# Patient Record
Sex: Female | Born: 1935 | Race: White | Hispanic: No | Smoking: Never smoker
Health system: Southern US, Community
[De-identification: ages and names within clinical notes are randomized; demographics above are authoritative.]

## PROBLEM LIST (undated history)

## (undated) DIAGNOSIS — R2681 Unsteadiness on feet: Secondary | ICD-10-CM

## (undated) DIAGNOSIS — I341 Nonrheumatic mitral (valve) prolapse: Secondary | ICD-10-CM

## (undated) DIAGNOSIS — K5900 Constipation, unspecified: Secondary | ICD-10-CM

## (undated) DIAGNOSIS — D72829 Elevated white blood cell count, unspecified: Secondary | ICD-10-CM

## (undated) DIAGNOSIS — E871 Hypo-osmolality and hyponatremia: Secondary | ICD-10-CM

## (undated) DIAGNOSIS — E039 Hypothyroidism, unspecified: Secondary | ICD-10-CM

## (undated) DIAGNOSIS — I10 Essential (primary) hypertension: Secondary | ICD-10-CM

## (undated) DIAGNOSIS — B029 Zoster without complications: Secondary | ICD-10-CM

## (undated) DIAGNOSIS — M1990 Unspecified osteoarthritis, unspecified site: Secondary | ICD-10-CM

## (undated) DIAGNOSIS — D62 Acute posthemorrhagic anemia: Secondary | ICD-10-CM

## (undated) DIAGNOSIS — M1711 Unilateral primary osteoarthritis, right knee: Secondary | ICD-10-CM

## (undated) DIAGNOSIS — L719 Rosacea, unspecified: Secondary | ICD-10-CM

## (undated) DIAGNOSIS — I739 Peripheral vascular disease, unspecified: Secondary | ICD-10-CM

## (undated) DIAGNOSIS — R011 Cardiac murmur, unspecified: Secondary | ICD-10-CM

## (undated) DIAGNOSIS — K219 Gastro-esophageal reflux disease without esophagitis: Secondary | ICD-10-CM

## (undated) HISTORY — DX: Unilateral primary osteoarthritis, right knee: M17.11

## (undated) HISTORY — DX: Zoster without complications: B02.9

## (undated) HISTORY — PX: CHOLECYSTECTOMY: SHX55

## (undated) HISTORY — DX: Constipation, unspecified: K59.00

## (undated) HISTORY — PX: COLONOSCOPY: SHX174

## (undated) HISTORY — DX: Unsteadiness on feet: R26.81

## (undated) HISTORY — PX: FINGER SURGERY: SHX640

## (undated) HISTORY — DX: Acute posthemorrhagic anemia: D62

## (undated) HISTORY — DX: Essential (primary) hypertension: I10

## (undated) HISTORY — DX: Hypo-osmolality and hyponatremia: E87.1

## (undated) HISTORY — DX: Nonrheumatic mitral (valve) prolapse: I34.1

## (undated) HISTORY — DX: Peripheral vascular disease, unspecified: I73.9

## (undated) HISTORY — PX: TUBAL LIGATION: SHX77

## (undated) HISTORY — PX: BUNIONECTOMY: SHX129

## (undated) HISTORY — DX: Elevated white blood cell count, unspecified: D72.829

---

## 2000-09-18 ENCOUNTER — Encounter (INDEPENDENT_AMBULATORY_CARE_PROVIDER_SITE_OTHER): Payer: Self-pay | Admitting: *Deleted

## 2000-09-18 ENCOUNTER — Ambulatory Visit (HOSPITAL_BASED_OUTPATIENT_CLINIC_OR_DEPARTMENT_OTHER): Admission: RE | Admit: 2000-09-18 | Discharge: 2000-09-18 | Payer: Self-pay | Admitting: General Surgery

## 2001-03-19 ENCOUNTER — Other Ambulatory Visit: Admission: RE | Admit: 2001-03-19 | Discharge: 2001-03-19 | Payer: Self-pay | Admitting: Family Medicine

## 2002-04-28 ENCOUNTER — Other Ambulatory Visit: Admission: RE | Admit: 2002-04-28 | Discharge: 2002-04-28 | Payer: Self-pay | Admitting: Family Medicine

## 2002-05-19 ENCOUNTER — Ambulatory Visit (HOSPITAL_COMMUNITY): Admission: RE | Admit: 2002-05-19 | Discharge: 2002-05-19 | Payer: Self-pay | Admitting: Gastroenterology

## 2003-05-27 ENCOUNTER — Other Ambulatory Visit: Admission: RE | Admit: 2003-05-27 | Discharge: 2003-05-27 | Payer: Self-pay | Admitting: Family Medicine

## 2004-06-08 ENCOUNTER — Other Ambulatory Visit: Admission: RE | Admit: 2004-06-08 | Discharge: 2004-06-08 | Payer: Self-pay | Admitting: Family Medicine

## 2005-07-03 ENCOUNTER — Other Ambulatory Visit: Admission: RE | Admit: 2005-07-03 | Discharge: 2005-07-03 | Payer: Self-pay | Admitting: Family Medicine

## 2006-10-01 ENCOUNTER — Other Ambulatory Visit: Admission: RE | Admit: 2006-10-01 | Discharge: 2006-10-01 | Payer: Self-pay | Admitting: Family Medicine

## 2008-12-28 ENCOUNTER — Encounter: Admission: RE | Admit: 2008-12-28 | Discharge: 2008-12-28 | Payer: Self-pay | Admitting: Family Medicine

## 2010-01-09 ENCOUNTER — Other Ambulatory Visit
Admission: RE | Admit: 2010-01-09 | Discharge: 2010-01-09 | Payer: Self-pay | Source: Home / Self Care | Admitting: Family Medicine

## 2010-01-12 ENCOUNTER — Encounter
Admission: RE | Admit: 2010-01-12 | Discharge: 2010-01-12 | Payer: Self-pay | Source: Home / Self Care | Attending: Family Medicine | Admitting: Family Medicine

## 2010-05-25 NOTE — Op Note (Signed)
   NAME:  [name], [name]                 ACCOUNT NO.:  [id number]   MEDICAL RECORD NO.:  [id number]                   PATIENT TYPE:  AMB   LOCATION:  ENDO                                 FACILITY:  [facility]   PHYSICIAN:  John C. Madilyn Fireman, M.D.                 DATE OF BIRTH:  09/07/1933   DATE OF PROCEDURE:  05/19/2002  DATE OF DISCHARGE:                                 OPERATIVE REPORT   PROCEDURE:  Colonoscopy.   INDICATIONS FOR PROCEDURE:  Colon cancer screening.   DESCRIPTION OF PROCEDURE:  The patient was placed in the left lateral  decubitus position and placed on the pulse monitor with continuous low flow  oxygen delivered by nasal cannula.  She was sedated with 50 mcg IV fentanyl,  6 mg IV Versed.  The Olympus video colonoscope was inserted into the rectum  and advanced to the cecum, confirmed by transillumination of McBurney's  point and visualization of the ileocecal valve and appendiceal orifice.  Prep was excellent.  The cecum, ascending, transverse, descending, and  sigmoid colon all appeared normal with no masses, polyps, diverticula, or  other mucosal abnormalities.  The rectum likewise appeared normal and  retroflexed view of the anus revealed no obvious internal hemorrhoids.  The  colonoscope was then withdrawn and the patient returned to the recovery room  in stable condition.  She tolerated the procedure well and there were no  immediate complications.   IMPRESSION:  Basically normal colonoscopy.   PLAN:  The next colon screening by sigmoidoscopy in five years.                                               John C. Madilyn Fireman, M.D.    JCH/MEDQ  D:  05/19/2002  T:  05/20/2002  Job:  [id number]

## 2010-05-25 NOTE — Op Note (Signed)
[facility]  Patient:    [name], [name] Visit Number: [id number] MRN: [id number]          Service Type: DSU Location: [name] Attending Physician:  Brandy Hale Dictated by:   Angelia Mould. Derrell Lolling, M.D. Proc. Date: 09/18/00 Admit Date:  09/18/2000   CC:         Hope M. Danella Deis, M.D.   Operative Report  PREOPERATIVE DIAGNOSIS:  A 3.0 cm diameter lipoma right groin.  POSTOPERATIVE DIAGNOSIS:  A 3.0 cm diameter lipoma right groin.  OPERATION PERFORMED:  Excision 3.0 cm diameter soft tissue mass of right groin.  SURGEON:  Angelia Mould. Derrell Lolling, M.D.  ANESTHESIA:  INDICATIONS FOR PROCEDURE:  The patient is a 77 year old white female who has had a mass in her right inguinal area for five years.  This has been getting larger and has now become very painful.  Exam reveals a 3.0 cm x 1.5 cm subcutaneous mass laterally at the inguinal crease.  This is not inflamed. There is no skin change and there is no evidence of a hernia.  She was brought to the operating room for excision of this soft tissue mass.  DESCRIPTION OF PROCEDURE:  The patient was brought to the minor procedure room at [facility] and placed supine on the operating table. The right groin was prepped and draped in sterile fashion.  1% Xylocaine with epinephrine was used as a local infiltration anesthetic.  A linear incision was made overlying the soft tissue mass.  The incision paralleled the inguinal crease.  I dissected out intact a 3.0 cm diameter soft tissue mass consistent with an encapsulated lipoma.  This was sent for routine histology.  The wound looked good.  Hemostasis was excellent.  The skin was closed with a running subcuticular suture of 4-0 Vicryl and Steri-Strips.  Clean bandages were placed and the patient was taken to the recovery room in stable condition. Estimated blood loss was about 5 cc.  Complications were none.  Sponge, needle and  instrument counts were correct. Dictated by:   Angelia Mould. Derrell Lolling, M.D. Attending Physician:  Brandy Hale DD:  09/18/00 TD:  09/18/00 Job: [id number]

## 2010-12-28 ENCOUNTER — Other Ambulatory Visit: Payer: Self-pay | Admitting: Family Medicine

## 2010-12-28 DIAGNOSIS — Z1231 Encounter for screening mammogram for malignant neoplasm of breast: Secondary | ICD-10-CM

## 2011-01-18 ENCOUNTER — Ambulatory Visit
Admission: RE | Admit: 2011-01-18 | Discharge: 2011-01-18 | Disposition: A | Discharge status: Home / Self Care | Payer: Medicare Other | Source: Ambulatory Visit | Attending: Family Medicine | Admitting: Family Medicine

## 2011-01-18 DIAGNOSIS — Z1231 Encounter for screening mammogram for malignant neoplasm of breast: Secondary | ICD-10-CM

## 2012-01-10 ENCOUNTER — Other Ambulatory Visit: Payer: Self-pay | Admitting: Family Medicine

## 2012-01-10 DIAGNOSIS — Z1231 Encounter for screening mammogram for malignant neoplasm of breast: Secondary | ICD-10-CM

## 2012-02-10 ENCOUNTER — Ambulatory Visit
Admission: RE | Admit: 2012-02-10 | Discharge: 2012-02-10 | Disposition: A | Discharge status: Home / Self Care | Payer: Medicare Other | Source: Ambulatory Visit | Attending: Family Medicine | Admitting: Family Medicine

## 2012-02-10 DIAGNOSIS — Z1231 Encounter for screening mammogram for malignant neoplasm of breast: Secondary | ICD-10-CM

## 2013-04-27 ENCOUNTER — Other Ambulatory Visit: Payer: Self-pay

## 2013-04-27 DIAGNOSIS — Z1231 Encounter for screening mammogram for malignant neoplasm of breast: Secondary | ICD-10-CM

## 2013-05-03 ENCOUNTER — Encounter (INDEPENDENT_AMBULATORY_CARE_PROVIDER_SITE_OTHER): Payer: Self-pay

## 2013-05-03 ENCOUNTER — Ambulatory Visit
Admission: RE | Admit: 2013-05-03 | Discharge: 2013-05-03 | Disposition: A | Discharge status: Home / Self Care | Payer: Commercial Managed Care - HMO | Source: Ambulatory Visit

## 2013-05-03 DIAGNOSIS — Z1231 Encounter for screening mammogram for malignant neoplasm of breast: Secondary | ICD-10-CM

## 2014-05-23 ENCOUNTER — Other Ambulatory Visit: Payer: Self-pay

## 2014-05-23 DIAGNOSIS — Z1231 Encounter for screening mammogram for malignant neoplasm of breast: Secondary | ICD-10-CM

## 2014-06-21 ENCOUNTER — Ambulatory Visit
Admission: RE | Admit: 2014-06-21 | Discharge: 2014-06-21 | Disposition: A | Discharge status: Home / Self Care | Payer: PPO | Source: Ambulatory Visit

## 2014-06-21 DIAGNOSIS — Z1231 Encounter for screening mammogram for malignant neoplasm of breast: Secondary | ICD-10-CM

## 2014-12-15 ENCOUNTER — Ambulatory Visit: Payer: Self-pay | Admitting: Orthopedic Surgery

## 2015-01-12 DIAGNOSIS — M25531 Pain in right wrist: Secondary | ICD-10-CM | POA: Diagnosis not present

## 2015-01-12 DIAGNOSIS — M79641 Pain in right hand: Secondary | ICD-10-CM | POA: Diagnosis not present

## 2015-01-24 ENCOUNTER — Encounter (HOSPITAL_COMMUNITY)
Admission: RE | Admit: 2015-01-24 | Discharge: 2015-01-24 | Disposition: A | Discharge status: Home / Self Care | Payer: PPO | Source: Ambulatory Visit | Attending: Specialist | Admitting: Specialist

## 2015-01-24 ENCOUNTER — Ambulatory Visit (HOSPITAL_COMMUNITY)
Admission: RE | Admit: 2015-01-24 | Discharge: 2015-01-24 | Disposition: A | Discharge status: Home / Self Care | Payer: PPO | Source: Ambulatory Visit | Attending: Orthopedic Surgery | Admitting: Orthopedic Surgery

## 2015-01-24 ENCOUNTER — Encounter (HOSPITAL_COMMUNITY): Payer: Self-pay

## 2015-01-24 DIAGNOSIS — R011 Cardiac murmur, unspecified: Secondary | ICD-10-CM | POA: Insufficient documentation

## 2015-01-24 DIAGNOSIS — I739 Peripheral vascular disease, unspecified: Secondary | ICD-10-CM | POA: Insufficient documentation

## 2015-01-24 DIAGNOSIS — M179 Osteoarthritis of knee, unspecified: Secondary | ICD-10-CM | POA: Diagnosis not present

## 2015-01-24 DIAGNOSIS — M1711 Unilateral primary osteoarthritis, right knee: Secondary | ICD-10-CM | POA: Diagnosis not present

## 2015-01-24 HISTORY — DX: Hypothyroidism, unspecified: E03.9

## 2015-01-24 HISTORY — DX: Cardiac murmur, unspecified: R01.1

## 2015-01-24 HISTORY — DX: Unspecified osteoarthritis, unspecified site: M19.90

## 2015-01-24 HISTORY — DX: Gastro-esophageal reflux disease without esophagitis: K21.9

## 2015-01-24 HISTORY — DX: Rosacea, unspecified: L71.9

## 2015-01-24 LAB — BASIC METABOLIC PANEL
ANION GAP: 9 (ref 5–15)
BUN: 12 mg/dL (ref 6–20)
CHLORIDE: 108 mmol/L (ref 101–111)
CO2: 26 mmol/L (ref 22–32)
Calcium: 9.4 mg/dL (ref 8.9–10.3)
Creatinine, Ser: 0.72 mg/dL (ref 0.44–1.00)
GFR calc non Af Amer: >60 mL/min (ref >60)
Glucose, Bld: 140 mg/dL — ABNORMAL HIGH (ref 65–99)
POTASSIUM: 4.3 mmol/L (ref 3.5–5.1)
SODIUM: 143 mmol/L (ref 135–145)

## 2015-01-24 LAB — CBC
HCT: 41.5 % (ref 36.0–46.0)
HEMOGLOBIN: 13.5 g/dL (ref 12.0–15.0)
MCH: 29.3 pg (ref 26.0–34.0)
MCHC: 32.5 g/dL (ref 30.0–36.0)
MCV: 90 fL (ref 78.0–100.0)
PLATELETS: 202 10*3/uL (ref 150–400)
RBC: 4.61 MIL/uL (ref 3.87–5.11)
RDW: 14.2 % (ref 11.5–15.5)
WBC: 6.5 10*3/uL (ref 4.0–10.5)

## 2015-01-24 LAB — URINALYSIS, ROUTINE W REFLEX MICROSCOPIC
BILIRUBIN URINE: NEGATIVE
Glucose, UA: NEGATIVE mg/dL
HGB URINE DIPSTICK: NEGATIVE
KETONES UR: NEGATIVE mg/dL
NITRITE: NEGATIVE
PH: 6 (ref 5.0–8.0)
Protein, ur: NEGATIVE mg/dL
SPECIFIC GRAVITY, URINE: 1.003 — AB (ref 1.005–1.030)

## 2015-01-24 LAB — URINE MICROSCOPIC-ADD ON

## 2015-01-24 LAB — SURGICAL PCR SCREEN
MRSA, PCR: NEGATIVE
STAPHYLOCOCCUS AUREUS: NEGATIVE

## 2015-01-24 LAB — APTT: aPTT: 27 seconds (ref 24–37)

## 2015-01-24 LAB — PROTIME-INR
INR: 1.05 (ref 0.00–1.49)
Prothrombin Time: 13.9 seconds (ref 11.6–15.2)

## 2015-01-24 LAB — ABO/RH: ABO/RH(D): A POS

## 2015-01-24 NOTE — Progress Notes (Signed)
Abnormal UA faxed to Dr. Beane 

## 2015-01-24 NOTE — Patient Instructions (Addendum)
YOUR PROCEDURE IS SCHEDULED ON :  02/02/15  REPORT TO [facility] MAIN ENTRANCE FOLLOW SIGNS TO EAST ELEVATOR - GO TO 3rd FLOOR CHECK IN AT 3 EAST NURSES STATION (SHORT STAY) AT:  5:15 AM  CALL THIS NUMBER IF YOU HAVE PROBLEMS THE MORNING OF SURGERY [phone]  REMEMBER:ONLY 1 PER PERSON MAY GO TO SHORT STAY WITH YOU TO GET READY THE MORNING OF YOUR SURGERY  DO NOT EAT FOOD OR DRINK LIQUIDS AFTER MIDNIGHT  TAKE THESE MEDICINES THE MORNING OF SURGERY: SYNTHROID  YOU MAY NOT HAVE ANY METAL ON YOUR BODY INCLUDING HAIR PINS AND PIERCING'S. DO NOT WEAR JEWELRY, MAKEUP, LOTIONS, POWDERS OR PERFUMES. DO NOT WEAR NAIL POLISH. DO NOT SHAVE 48 HRS PRIOR TO SURGERY. MEN MAY SHAVE FACE AND NECK.  DO NOT BRING VALUABLES TO HOSPITAL. [facility] IS NOT RESPONSIBLE FOR VALUABLES.  CONTACTS, DENTURES OR PARTIALS MAY NOT BE WORN TO SURGERY. LEAVE SUITCASE IN CAR. CAN BE BROUGHT TO ROOM AFTER SURGERY.  PATIENTS DISCHARGED THE DAY OF SURGERY WILL NOT BE ALLOWED TO DRIVE HOME.  PLEASE READ OVER THE FOLLOWING INSTRUCTION SHEETS _________________________________________________________________________________                                          [facility] - PREPARING FOR SURGERY  Before surgery, you can play an important role.  Because skin is not sterile, your skin needs to be as free of germs as possible.  You can reduce the number of germs on your skin by washing with CHG (chlorahexidine gluconate) soap before surgery.  CHG is an antiseptic cleaner which kills germs and bonds with the skin to continue killing germs even after washing. Please DO NOT use if you have an allergy to CHG or antibacterial soaps.  If your skin becomes reddened/irritated stop using the CHG and inform your nurse when you arrive at Short Stay. Do not shave (including legs and underarms) for at least 48 hours prior to the first CHG shower.  You may shave your face. Please follow these instructions  carefully:   1.  Shower with CHG Soap the night before surgery and the  morning of Surgery.   2.  If you choose to wash your hair, wash your hair first as usual with your  normal  Shampoo.   3.  After you shampoo, rinse your hair and body thoroughly to remove the  shampoo.                                         4.  Use CHG as you would any other liquid soap.  You can apply chg directly  to the skin and wash . Gently wash with scrungie or clean wascloth    5.  Apply the CHG Soap to your body ONLY FROM THE NECK DOWN.   Do not use on open                           Wound or open sores. Avoid contact with eyes, ears mouth and genitals (private parts).                        Genitals (private parts) with your normal soap.  6.  Wash thoroughly, paying special attention to the area where your surgery  will be performed.   7.  Thoroughly rinse your body with warm water from the neck down.   8.  DO NOT shower/wash with your normal soap after using and rinsing off  the CHG Soap .                9.  Pat yourself dry with a clean towel.             10.  Wear clean night clothes to bed after shower             11.  Place clean sheets on your bed the night of your first shower and do not  sleep with pets.  Day of Surgery : Do not apply any lotions/deodorants the morning of surgery.  Please wear clean clothes to the hospital/surgery center.  FAILURE TO FOLLOW THESE INSTRUCTIONS MAY RESULT IN THE CANCELLATION OF YOUR SURGERY    PATIENT SIGNATURE_________________________________  ______________________________________________________________________     [name]  An incentive spirometer is a tool that can help keep your lungs clear and active. This tool measures how well you are filling your lungs with each breath. Taking long deep breaths may help reverse or decrease the chance of developing breathing (pulmonary) problems (especially infection) following:  A long  period of time when you are unable to move or be active. BEFORE THE PROCEDURE   If the spirometer includes an indicator to show your best effort, your nurse or respiratory therapist will set it to a desired goal.  If possible, sit up straight or lean slightly forward. Try not to slouch.  Hold the incentive spirometer in an upright position. INSTRUCTIONS FOR USE  1. Sit on the edge of your bed if possible, or sit up as far as you can in bed or on a chair. 2. Hold the incentive spirometer in an upright position. 3. Breathe out normally. 4. Place the mouthpiece in your mouth and seal your lips tightly around it. 5. Breathe in slowly and as deeply as possible, raising the piston or the ball toward the top of the column. 6. Hold your breath for 3-5 seconds or for as long as possible. Allow the piston or ball to fall to the bottom of the column. 7. Remove the mouthpiece from your mouth and breathe out normally. 8. Rest for a few seconds and repeat Steps 1 through 7 at least 10 times every 1-2 hours when you are awake. Take your time and take a few normal breaths between deep breaths. 9. The spirometer may include an indicator to show your best effort. Use the indicator as a goal to work toward during each repetition. 10. After each set of 10 deep breaths, practice coughing to be sure your lungs are clear. If you have an incision (the cut made at the time of surgery), support your incision when coughing by placing a pillow or rolled up towels firmly against it. Once you are able to get out of bed, walk around indoors and cough well. You may stop using the incentive spirometer when instructed by your caregiver.  RISKS AND COMPLICATIONS  Take your time so you do not get dizzy or light-headed.  If you are in pain, you may need to take or ask for pain medication before doing incentive spirometry. It is harder to take a deep breath if you are having pain. AFTER USE  Rest and breathe slowly and  easily.  It can be helpful to keep track of a log of your progress. Your caregiver can provide you with a simple table to help with this. If you are using the spirometer at home, follow these instructions: [place] IF:   You are having difficultly using the spirometer.  You have trouble using the spirometer as often as instructed.  Your pain medication is not giving enough relief while using the spirometer.  You develop fever of 100.5 F (38.1 C) or higher. SEEK IMMEDIATE MEDICAL CARE IF:   You cough up bloody sputum that had not been present before.  You develop fever of 102 F (38.9 C) or greater.  You develop worsening pain at or near the incision site. MAKE SURE YOU:   Understand these instructions.  Will watch your condition.  Will get help right away if you are not doing well or get worse. Document Released: 05/06/2006 Document Revised: 03/18/2011 Document Reviewed: 07/07/2006 [facility] Patient Information 2014 [place], [place].   ________________________________________________________________________

## 2015-01-25 ENCOUNTER — Ambulatory Visit: Payer: Self-pay | Admitting: Orthopedic Surgery

## 2015-01-25 NOTE — H&P (Signed)
[name] DOB: 09/13/1933 Widowed / Language: English / Race: White Female  H&P Date: 01/25/15  Chief Complaint: Right knee pain  History of Present Illness  The patient is a 81 year old female who comes in today for a preoperative History and Physical. The patient is scheduled for a right total knee arthroplasty to be performed by Dr. Jeffrey C. Beane, MD at [facility] on 02-02-2015. [name] reports ongoing R knee pain refractory to both cortisone and viscosupplementation injections, bracing, activity modifications, relative rest, quad strengthening, medication. Her pain is interfering with ADL's and quality of life and she desires to proceed with surgery at this point.  Dr. Beane and the patient mutually agreed to proceed with a Right total knee replacement. Risks and benefits of the procedure were discussed including stiffness, suboptimal range of motion, persistent pain, infection requiring removal of prosthesis and reinsertion, need for prophylactic antibiotics in the future, for example, dental procedures, possible need for manipulation, revision in the future and also anesthetic complications including DVT, PE, etc. We discussed the perioperative course, time in the hospital, postoperative recovery and the need for elevation to control swelling. We also discussed the predicted range of motion and the probability that squatting and kneeling would be unobtainable in the future. In addition, postoperative anticoagulation was discussed. We have obtained preoperative medical clearance as necessary. Provided her illustrated handout and discussed it in detail. They will enroll in the total joint replacement educational forum at the hospital.  Allergies Ampicillin Sodium *PENICILLINS*  Allergies Reconciled   Family History  Heart Disease  Father. Hypertension  Mother. Rheumatoid Arthritis  Sister. Cancer  Brother, Sister. Cerebrovascular Accident  Mother. Congestive Heart  Failure  Father. First Degree Relatives   Social History  Tobacco use  Never smoker. 10/19/2014 Children  2 Current work status  retired Never consumed alcohol  10/19/2014: Never consumed alcohol Exercise  Exercises daily; does other Living situation  live alone Marital status  widowed No history of drug/alcohol rehab  Not under pain contract  Number of flights of stairs before winded  2-3 Tobacco / smoke exposure  10/19/2014: no Post-Surgical Plans  [facility] [facility] then home. Has 2 daughters nearby to help.  Medication History Aleve (220MG Capsule, 1 (one) Oral) Active. (prn) Synthroid (88MCG Tablet, Oral) Active. Medications Reconciled  Pregnancy / Birth History Pregnant  no  Past Surgical History  Gallbladder Surgery  laporoscopic 1993 Tubal Ligation  Foot Surgery - Right  bunionectomy, 2014  Past Medical Hx Hypercholesterolemia  Gastroesophageal Reflux Disease  Hypothyroidism  Valve Disorder (one valve clicks)  Osteoarthritis  Hemorrhoids   Review of Systems General Not Present- Chills, Fatigue, Fever, Memory Loss, Night Sweats, Weight Gain and Weight Loss. Skin Not Present- Eczema, Hives, Itching, Lesions and Rash. HEENT Not Present- Dentures, Double Vision, Headache, Hearing Loss, Tinnitus and Visual Loss. Respiratory Not Present- Allergies, Chronic Cough, Coughing up blood, Shortness of breath at rest and Shortness of breath with exertion. Cardiovascular Not Present- Chest Pain, Difficulty Breathing Lying Down, Murmur, Palpitations, Racing/skipping heartbeats and Swelling. Gastrointestinal Not Present- Abdominal Pain, Bloody Stool, Constipation, Diarrhea, Difficulty Swallowing, Heartburn, Jaundice, Loss of appetitie, Nausea and Vomiting. Female Genitourinary Not Present- Blood in Urine, Discharge, Flank Pain, Incontinence, Painful Urination, Urgency, Urinary frequency, Urinary Retention, Urinating at Night and Weak urinary  stream. Musculoskeletal Present- Joint Pain, Joint Stiffness and Morning Stiffness. Not Present- Back Pain, Joint Swelling, Muscle Pain, Muscle Weakness and Spasms. Neurological Not Present- Blackout spells, Difficulty with balance, Dizziness, Paralysis, Tremor and   Weakness. Psychiatric Not Present- Insomnia.  Vitals 01/25/2015 2:01 PM Weight: 140 lb Height: 58.5in Body Surface Area: 1.57 m Body Mass Index: 28.76 kg/m  Pulse: 95 (Regular)  BP: 155/73 (Sitting, Left Arm, Standard)  Physical Exam General Mental Status -Alert, cooperative and good historian. General Appearance-pleasant, Not in acute distress. Orientation-Oriented X3. Build & Nutrition-Well nourished and Well developed.  Head and Neck Head-normocephalic, atraumatic . Neck Global Assessment - supple, no bruit auscultated on the right, no bruit auscultated on the left.  Eye Pupil - Bilateral-Regular and Round. Motion - Bilateral-EOMI.  Chest and Lung Exam Auscultation Breath sounds - clear at anterior chest wall and clear at posterior chest wall. Adventitious sounds - No Adventitious sounds.  Cardiovascular Auscultation Rhythm - Regular rate and rhythm. Heart Sounds - S1 WNL and S2 WNL. Murmurs & Other Heart Sounds - Auscultation of the heart reveals - No Murmurs.  Abdomen Palpation/Percussion Tenderness - Abdomen is non-tender to palpation. Rigidity (guarding) - Abdomen is soft. Auscultation Auscultation of the abdomen reveals - Bowel sounds normal.  Female Genitourinary Not done, not pertinent to present illness  Musculoskeletal On exam, well nourished, well developed. She does have a slightly antalgic gait, awake, alert and oriented x3. Right knee tender to palpation medial joint line and in the anteromedial tibia. Nontender over the lateral joint line, patella, patellar tendon, quad tendon, peroneal nerve, fibular head, popliteal space. There is no calf pain, no sign of DVT. No pain  or laxity with varus or valgus stress. Negative McMurray's. Negative patellofemoral crepitus. She does have a trace effusion. Range of motion approximately -5 to 95 degrees.  Imaging Prior x-rays and MRI reviewed. Bone on bone arthrosis, medial compartment. Moderately severe and essentially bone on bone on the left knee medial compartment. She does have intense marrow edema in the medial femoral condyle of the right knee as noted on her MRI consistent with a stress reaction, changed with her severe degenerative joint disease.  Assessment & Plan Primary osteoarthritis of right knee (M17.11)  Pt with end-stage Right knee DJD, bone-on-bone, refractory to conservative tx, scheduled for Right total knee replacement by Dr. Beane on 02/02/15. We again discussed the procedure itself as well as risks, complications and alternatives, including but not limited to DVT, PE, infx, bleeding, failure of procedure, need for secondary procedure including manipulation, nerve injury, ongoing pain/symptoms, anesthesia risk, even stroke or death. Also discussed typical post-op protocols, activity restrictions, need for PT, flexion/extension exercises, time out of work. Discussed need for DVT ppx post-op with Xarelto then ASA per protocol. Discussed dental ppx. Also discussed limitations post-operatively such as kneeling and squatting. All questions were answered. Patient desires to proceed with surgery as scheduled. Will hold ASA and NSAIDs accordingly. Will remain NPO after MN night before surgery. Will present to [facility] for pre-op testing. Plan Xarelto 2 weeks post-op for DVT ppx then ASA. Plan pain medication, Colace. Plan D/C to [place] then home with HHPT vs. outpatient PT with family members at home for assistance. Will follow up 10-14 days post-op for suture removal and xrays.  Plan Right total knee arthroplasty  Signed electronically by Jaclyn M Bissell, PA-C for Dr. Beane  

## 2015-02-01 ENCOUNTER — Encounter (HOSPITAL_COMMUNITY): Payer: Self-pay | Admitting: Anesthesiology

## 2015-02-01 NOTE — Anesthesia Preprocedure Evaluation (Addendum)
Anesthesia Evaluation  Patient identified by MRN, date of birth, ID band Patient awake    Reviewed: Allergy & Precautions, NPO status , Patient's Chart, lab work & pertinent test results  Airway Mallampati: II  TM Distance: >3 FB Neck ROM: Full    Dental no notable dental hx.    Pulmonary neg pulmonary ROS,    Pulmonary exam normal breath sounds clear to auscultation       Cardiovascular Exercise Tolerance: Good Normal cardiovascular exam+ Valvular Problems/Murmurs  Rhythm:Regular Rate:Normal     Neuro/Psych negative neurological ROS  negative psych ROS   GI/Hepatic Neg liver ROS, GERD  ,  Endo/Other  Hypothyroidism   Renal/GU negative Renal ROS  negative genitourinary   Musculoskeletal  (+) Arthritis , Osteoarthritis,    Abdominal (+) + obese,   Peds negative pediatric ROS (+)  Hematology negative hematology ROS (+)   Anesthesia Other Findings   Reproductive/Obstetrics negative OB ROS                            Anesthesia Physical Anesthesia Plan  ASA: II  Anesthesia Plan: Spinal   Post-op Pain Management:    Induction: Intravenous  Airway Management Planned: Natural Airway  Additional Equipment:   Intra-op Plan:   Post-operative Plan:   Informed Consent: I have reviewed the patients History and Physical, chart, labs and discussed the procedure including the risks, benefits and alternatives for the proposed anesthesia with the patient or authorized representative who has indicated his/her understanding and acceptance.   Dental advisory given  Plan Discussed with: CRNA  Anesthesia Plan Comments: (Discussed risks and benefits of and differences between spinal and general. Discussed risks of spinal including headache, backache, failure, bleeding and hematoma, infection, and nerve damage. Patient consents to spinal. Questions answered. Coagulation studies and platelet count  acceptable.)       Anesthesia Quick Evaluation

## 2015-02-02 ENCOUNTER — Inpatient Hospital Stay (HOSPITAL_COMMUNITY)
Admission: RE | Admit: 2015-02-02 | Discharge: 2015-02-04 | DRG: 470 | Disposition: A | Discharge status: Skilled Nursing Facility | Payer: PPO | Source: Ambulatory Visit | Attending: Specialist | Admitting: Specialist

## 2015-02-02 ENCOUNTER — Encounter (HOSPITAL_COMMUNITY): Payer: Self-pay | Admitting: *Deleted

## 2015-02-02 ENCOUNTER — Inpatient Hospital Stay (HOSPITAL_COMMUNITY): Payer: PPO | Admitting: Anesthesiology

## 2015-02-02 ENCOUNTER — Encounter (HOSPITAL_COMMUNITY)
Admission: RE | Disposition: A | Discharge status: Skilled Nursing Facility | Payer: Self-pay | Source: Ambulatory Visit | Attending: Specialist

## 2015-02-02 ENCOUNTER — Inpatient Hospital Stay (HOSPITAL_COMMUNITY): Payer: PPO

## 2015-02-02 DIAGNOSIS — Z79899 Other long term (current) drug therapy: Secondary | ICD-10-CM | POA: Diagnosis not present

## 2015-02-02 DIAGNOSIS — M1712 Unilateral primary osteoarthritis, left knee: Secondary | ICD-10-CM | POA: Diagnosis present

## 2015-02-02 DIAGNOSIS — M179 Osteoarthritis of knee, unspecified: Secondary | ICD-10-CM | POA: Diagnosis not present

## 2015-02-02 DIAGNOSIS — Z96651 Presence of right artificial knee joint: Secondary | ICD-10-CM | POA: Diagnosis not present

## 2015-02-02 DIAGNOSIS — M1711 Unilateral primary osteoarthritis, right knee: Secondary | ICD-10-CM | POA: Diagnosis not present

## 2015-02-02 DIAGNOSIS — M25561 Pain in right knee: Secondary | ICD-10-CM | POA: Diagnosis not present

## 2015-02-02 DIAGNOSIS — E039 Hypothyroidism, unspecified: Secondary | ICD-10-CM | POA: Diagnosis not present

## 2015-02-02 DIAGNOSIS — Z471 Aftercare following joint replacement surgery: Secondary | ICD-10-CM | POA: Diagnosis not present

## 2015-02-02 DIAGNOSIS — Z96659 Presence of unspecified artificial knee joint: Secondary | ICD-10-CM

## 2015-02-02 HISTORY — PX: TOTAL KNEE ARTHROPLASTY: SHX125

## 2015-02-02 LAB — TYPE AND SCREEN
ABO/RH(D): A POS
ANTIBODY SCREEN: NEGATIVE

## 2015-02-02 SURGERY — ARTHROPLASTY, KNEE, TOTAL
Anesthesia: Spinal | Site: Knee | Laterality: Right

## 2015-02-02 MED ORDER — PROPOFOL 10 MG/ML IV BOLUS
INTRAVENOUS | Status: AC
  Filled 2015-02-02: qty 20

## 2015-02-02 MED ORDER — MAGNESIUM CITRATE PO SOLN: 1.0000 | Freq: Once | ORAL | Status: DC | PRN

## 2015-02-02 MED ORDER — MAGNESIUM 200 MG PO TABS
200.0000 mg | ORAL_TABLET | Freq: Every day | ORAL | Status: DC
  Filled 2015-02-02: qty 1

## 2015-02-02 MED ORDER — CEFAZOLIN SODIUM-DEXTROSE 2-3 GM-% IV SOLR
2.0000 g | INTRAVENOUS | Status: AC
  Administered 2015-02-02: 2 g via INTRAVENOUS

## 2015-02-02 MED ORDER — MENTHOL 3 MG MT LOZG: 1.0000 | LOZENGE | OROMUCOSAL | Status: DC | PRN

## 2015-02-02 MED ORDER — KCL IN DEXTROSE-NACL 20-5-0.45 MEQ/L-%-% IV SOLN
INTRAVENOUS | Status: AC
  Administered 2015-02-02: 13:00:00 via INTRAVENOUS
  Filled 2015-02-02 (×2): qty 1000

## 2015-02-02 MED ORDER — PROMETHAZINE HCL 25 MG/ML IJ SOLN
6.2500 mg | INTRAMUSCULAR | Status: DC | PRN
Start: 2015-02-02 — End: 2015-02-02

## 2015-02-02 MED ORDER — ONDANSETRON HCL 4 MG/2ML IJ SOLN
INTRAMUSCULAR | Status: AC
  Filled 2015-02-02: qty 2

## 2015-02-02 MED ORDER — DOCUSATE SODIUM 100 MG PO CAPS
100.0000 mg | ORAL_CAPSULE | Freq: Two times a day (BID) | ORAL | Status: DC | PRN
Start: 2015-02-02 — End: 2015-02-08

## 2015-02-02 MED ORDER — METHOCARBAMOL 500 MG PO TABS
500.0000 mg | ORAL_TABLET | Freq: Four times a day (QID) | ORAL | Status: DC | PRN
  Administered 2015-02-02: 500 mg via ORAL
  Filled 2015-02-02: qty 1

## 2015-02-02 MED ORDER — PHENYLEPHRINE 40 MCG/ML (10ML) SYRINGE FOR IV PUSH (FOR BLOOD PRESSURE SUPPORT)
PREFILLED_SYRINGE | INTRAVENOUS | Status: AC
  Filled 2015-02-02: qty 10

## 2015-02-02 MED ORDER — ARTIFICIAL TEARS OP OINT
TOPICAL_OINTMENT | Freq: Two times a day (BID) | OPHTHALMIC | Status: DC
  Administered 2015-02-03 – 2015-02-04 (×2): via OPHTHALMIC
  Filled 2015-02-02: qty 3.5

## 2015-02-02 MED ORDER — FENTANYL CITRATE (PF) 100 MCG/2ML IJ SOLN
INTRAMUSCULAR | Status: AC
  Filled 2015-02-02: qty 2

## 2015-02-02 MED ORDER — DOCUSATE SODIUM 100 MG PO CAPS
100.0000 mg | ORAL_CAPSULE | Freq: Two times a day (BID) | ORAL | Status: DC
  Administered 2015-02-02 – 2015-02-04 (×4): 100 mg via ORAL

## 2015-02-02 MED ORDER — HYDROMORPHONE HCL 1 MG/ML IJ SOLN
0.5000 mg | INTRAMUSCULAR | Status: DC | PRN
  Administered 2015-02-02: 0.5 mg via INTRAVENOUS
  Filled 2015-02-02: qty 1

## 2015-02-02 MED ORDER — METHOCARBAMOL 1000 MG/10ML IJ SOLN
500.0000 mg | Freq: Four times a day (QID) | INTRAMUSCULAR | Status: DC | PRN
  Administered 2015-02-02: 500 mg via INTRAVENOUS
  Filled 2015-02-02 (×2): qty 5

## 2015-02-02 MED ORDER — MAGNESIUM OXIDE 400 (241.3 MG) MG PO TABS
200.0000 mg | ORAL_TABLET | Freq: Every day | ORAL | Status: DC
  Administered 2015-02-03 – 2015-02-04 (×2): 200 mg via ORAL
  Filled 2015-02-02 (×3): qty 0.5

## 2015-02-02 MED ORDER — BUPIVACAINE IN DEXTROSE 0.75-8.25 % IT SOLN
INTRATHECAL | Status: DC | PRN
  Administered 2015-02-02: 1.6 mL via INTRATHECAL

## 2015-02-02 MED ORDER — EPHEDRINE SULFATE 50 MG/ML IJ SOLN
INTRAMUSCULAR | Status: AC
  Filled 2015-02-02: qty 1

## 2015-02-02 MED ORDER — BUPIVACAINE-EPINEPHRINE 0.25% -1:200000 IJ SOLN
INTRAMUSCULAR | Status: AC
  Filled 2015-02-02: qty 1

## 2015-02-02 MED ORDER — ACETAMINOPHEN 325 MG PO TABS
650.0000 mg | ORAL_TABLET | Freq: Four times a day (QID) | ORAL | Status: DC | PRN
  Administered 2015-02-03: 650 mg via ORAL
  Filled 2015-02-02: qty 2

## 2015-02-02 MED ORDER — LACTATED RINGERS IV SOLN
INTRAVENOUS | Status: DC
  Administered 2015-02-02: 12:00:00 via INTRAVENOUS

## 2015-02-02 MED ORDER — ONDANSETRON HCL 4 MG/2ML IJ SOLN
INTRAMUSCULAR | Status: DC | PRN
  Administered 2015-02-02: 4 mg via INTRAVENOUS

## 2015-02-02 MED ORDER — OXYCODONE-ACETAMINOPHEN 5-325 MG PO TABS: 1.0000 | ORAL_TABLET | ORAL | Status: DC | PRN

## 2015-02-02 MED ORDER — PROPOFOL 500 MG/50ML IV EMUL
INTRAVENOUS | Status: DC | PRN
  Administered 2015-02-02: 75 ug/kg/min via INTRAVENOUS

## 2015-02-02 MED ORDER — LACTATED RINGERS IV SOLN
INTRAVENOUS | Status: DC | PRN
Start: 2015-02-02 — End: 2015-02-02
  Administered 2015-02-02 (×2): via INTRAVENOUS

## 2015-02-02 MED ORDER — METOCLOPRAMIDE HCL 5 MG/ML IJ SOLN: 5.0000 mg | Freq: Three times a day (TID) | INTRAMUSCULAR | Status: DC | PRN

## 2015-02-02 MED ORDER — SODIUM CHLORIDE 0.9 % IR SOLN
Status: AC
  Filled 2015-02-02: qty 1

## 2015-02-02 MED ORDER — BISACODYL 5 MG PO TBEC: 5.0000 mg | DELAYED_RELEASE_TABLET | Freq: Every day | ORAL | Status: DC | PRN

## 2015-02-02 MED ORDER — HYDROMORPHONE HCL 1 MG/ML IJ SOLN
INTRAMUSCULAR | Status: AC
  Filled 2015-02-02: qty 1

## 2015-02-02 MED ORDER — PHENOL 1.4 % MT LIQD: 1.0000 | OROMUCOSAL | Status: DC | PRN

## 2015-02-02 MED ORDER — MIDAZOLAM HCL 5 MG/5ML IJ SOLN
INTRAMUSCULAR | Status: DC | PRN
  Administered 2015-02-02: 1 mg via INTRAVENOUS

## 2015-02-02 MED ORDER — DIPHENHYDRAMINE HCL 12.5 MG/5ML PO ELIX: 12.5000 mg | ORAL_SOLUTION | ORAL | Status: DC | PRN

## 2015-02-02 MED ORDER — RIVAROXABAN 10 MG PO TABS: 10.0000 mg | ORAL_TABLET | Freq: Every day | ORAL | Status: DC

## 2015-02-02 MED ORDER — ONDANSETRON HCL 4 MG/2ML IJ SOLN: 4.0000 mg | Freq: Four times a day (QID) | INTRAMUSCULAR | Status: DC | PRN

## 2015-02-02 MED ORDER — CEFAZOLIN SODIUM-DEXTROSE 2-3 GM-% IV SOLR
2.0000 g | Freq: Four times a day (QID) | INTRAVENOUS | Status: AC
  Administered 2015-02-02 – 2015-02-03 (×3): 2 g via INTRAVENOUS
  Filled 2015-02-02 (×3): qty 50

## 2015-02-02 MED ORDER — OXYCODONE HCL 5 MG PO TABS
5.0000 mg | ORAL_TABLET | ORAL | Status: DC | PRN
  Administered 2015-02-02 – 2015-02-04 (×12): 10 mg via ORAL
  Filled 2015-02-02 (×12): qty 2

## 2015-02-02 MED ORDER — CEFAZOLIN SODIUM-DEXTROSE 2-3 GM-% IV SOLR
INTRAVENOUS | Status: AC
  Filled 2015-02-02: qty 50

## 2015-02-02 MED ORDER — RISAQUAD PO CAPS
1.0000 | ORAL_CAPSULE | Freq: Every day | ORAL | Status: DC
  Administered 2015-02-03 – 2015-02-04 (×2): 1 via ORAL
  Filled 2015-02-02 (×3): qty 1

## 2015-02-02 MED ORDER — FENTANYL CITRATE (PF) 100 MCG/2ML IJ SOLN
INTRAMUSCULAR | Status: DC | PRN
  Administered 2015-02-02 (×2): 50 ug via INTRAVENOUS

## 2015-02-02 MED ORDER — LEVOTHYROXINE SODIUM 88 MCG PO TABS
88.0000 ug | ORAL_TABLET | Freq: Every day | ORAL | Status: DC
  Administered 2015-02-03 – 2015-02-04 (×2): 88 ug via ORAL
  Filled 2015-02-02 (×3): qty 1

## 2015-02-02 MED ORDER — ALUM & MAG HYDROXIDE-SIMETH 200-200-20 MG/5ML PO SUSP: 30.0000 mL | ORAL | Status: DC | PRN

## 2015-02-02 MED ORDER — SODIUM CHLORIDE 0.9 % IR SOLN
Status: DC | PRN
  Administered 2015-02-02: 2000 mL

## 2015-02-02 MED ORDER — LACTATED RINGERS IV SOLN: INTRAVENOUS | Status: DC

## 2015-02-02 MED ORDER — HYDROMORPHONE HCL 1 MG/ML IJ SOLN
0.2500 mg | INTRAMUSCULAR | Status: DC | PRN
  Administered 2015-02-02 (×4): 0.5 mg via INTRAVENOUS

## 2015-02-02 MED ORDER — PHENYLEPHRINE HCL 10 MG/ML IJ SOLN
INTRAMUSCULAR | Status: DC | PRN
  Administered 2015-02-02 (×2): 40 ug via INTRAVENOUS

## 2015-02-02 MED ORDER — MIDAZOLAM HCL 2 MG/2ML IJ SOLN
INTRAMUSCULAR | Status: AC
  Filled 2015-02-02: qty 2

## 2015-02-02 MED ORDER — RIVAROXABAN 10 MG PO TABS
10.0000 mg | ORAL_TABLET | Freq: Every day | ORAL | Status: DC
  Administered 2015-02-03 – 2015-02-04 (×2): 10 mg via ORAL
  Filled 2015-02-02 (×4): qty 1

## 2015-02-02 MED ORDER — ACETAMINOPHEN 650 MG RE SUPP: 650.0000 mg | Freq: Four times a day (QID) | RECTAL | Status: DC | PRN

## 2015-02-02 MED ORDER — BUPIVACAINE-EPINEPHRINE 0.25% -1:200000 IJ SOLN
INTRAMUSCULAR | Status: DC | PRN
  Administered 2015-02-02: 40 mL

## 2015-02-02 MED ORDER — POLYETHYL GLYCOL-PROPYL GLYCOL 0.4-0.3 % OP GEL
Freq: Two times a day (BID) | OPHTHALMIC | Status: DC
  Filled 2015-02-02: qty 10

## 2015-02-02 MED ORDER — ONDANSETRON HCL 4 MG PO TABS
4.0000 mg | ORAL_TABLET | Freq: Four times a day (QID) | ORAL | Status: DC | PRN
  Administered 2015-02-03: 4 mg via ORAL
  Filled 2015-02-02: qty 1

## 2015-02-02 MED ORDER — SODIUM CHLORIDE 0.9 % IJ SOLN
INTRAMUSCULAR | Status: AC
  Filled 2015-02-02: qty 10

## 2015-02-02 MED ORDER — METOCLOPRAMIDE HCL 10 MG PO TABS: 5.0000 mg | ORAL_TABLET | Freq: Three times a day (TID) | ORAL | Status: DC | PRN

## 2015-02-02 MED ORDER — SODIUM CHLORIDE 0.9 % IR SOLN
Status: DC | PRN
  Administered 2015-02-02: 500 mL

## 2015-02-02 MED ORDER — SENNOSIDES-DOCUSATE SODIUM 8.6-50 MG PO TABS: 1.0000 | ORAL_TABLET | Freq: Every evening | ORAL | Status: DC | PRN

## 2015-02-02 MED ORDER — PROPOFOL 10 MG/ML IV BOLUS
INTRAVENOUS | Status: AC
  Filled 2015-02-02: qty 40

## 2015-02-02 MED ORDER — VITAMIN B-6 100 MG PO TABS
200.0000 mg | ORAL_TABLET | Freq: Every day | ORAL | Status: DC
  Administered 2015-02-03 – 2015-02-04 (×2): 200 mg via ORAL
  Filled 2015-02-02 (×3): qty 2

## 2015-02-02 SURGICAL SUPPLY — 61 items
ATTUNE PSRP INSR SZ4 5 KNEE (Insert) ×2 IMPLANT
BAG ZIPLOCK 12X15 (MISCELLANEOUS) IMPLANT
BANDAGE ACE 4X5 VEL STRL LF (GAUZE/BANDAGES/DRESSINGS) ×2 IMPLANT
BANDAGE ACE 6X5 VEL STRL LF (GAUZE/BANDAGES/DRESSINGS) ×2 IMPLANT
BANDAGE ELASTIC 4 LF NS (GAUZE/BANDAGES/DRESSINGS) ×2 IMPLANT
BANDAGE ELASTIC 6 VELCRO ST LF (GAUZE/BANDAGES/DRESSINGS) ×2 IMPLANT
BLADE SAG 18X100X1.27 (BLADE) ×2 IMPLANT
BLADE SAW SGTL 13.0X1.19X90.0M (BLADE) ×2 IMPLANT
CAPT KNEE TOTAL 3 ATTUNE ×2 IMPLANT
CEMENT HV SMART SET (Cement) ×4 IMPLANT
CLOTH 2% CHLOROHEXIDINE 3PK (PERSONAL CARE ITEMS) ×2 IMPLANT
CUFF TOURN SGL QUICK 34 (TOURNIQUET CUFF) ×1
CUFF TRNQT CYL 34X4X40X1 (TOURNIQUET CUFF) ×1 IMPLANT
DECANTER SPIKE VIAL GLASS SM (MISCELLANEOUS) ×2 IMPLANT
DRAPE INCISE IOBAN 66X45 STRL (DRAPES) IMPLANT
DRAPE LG THREE QUARTER DISP (DRAPES) ×2 IMPLANT
DRAPE ORTHO SPLIT 77X108 STRL (DRAPES) ×2
DRAPE SURG ORHT 6 SPLT 77X108 (DRAPES) ×2 IMPLANT
DRAPE U-SHAPE 47X51 STRL (DRAPES) ×2 IMPLANT
DRSG AQUACEL AG ADV 3.5X 6 (GAUZE/BANDAGES/DRESSINGS) ×2 IMPLANT
DRSG AQUACEL AG ADV 3.5X10 (GAUZE/BANDAGES/DRESSINGS) IMPLANT
DRSG TEGADERM 4X4.75 (GAUZE/BANDAGES/DRESSINGS) IMPLANT
DURAPREP 26ML APPLICATOR (WOUND CARE) ×2 IMPLANT
ELECT REM PT RETURN 9FT ADLT (ELECTROSURGICAL) ×2
ELECTRODE REM PT RTRN 9FT ADLT (ELECTROSURGICAL) ×1 IMPLANT
EVACUATOR 1/8 PVC DRAIN (DRAIN) ×2 IMPLANT
GAUZE SPONGE 2X2 8PLY STRL LF (GAUZE/BANDAGES/DRESSINGS) IMPLANT
GLOVE BIOGEL PI IND STRL 7.0 (GLOVE) ×1 IMPLANT
GLOVE BIOGEL PI IND STRL 8 (GLOVE) ×1 IMPLANT
GLOVE BIOGEL PI INDICATOR 7.0 (GLOVE) ×1
GLOVE BIOGEL PI INDICATOR 8 (GLOVE) ×1
GLOVE SURG SS PI 7.0 STRL IVOR (GLOVE) ×2 IMPLANT
GLOVE SURG SS PI 7.5 STRL IVOR (GLOVE) ×2 IMPLANT
GLOVE SURG SS PI 8.0 STRL IVOR (GLOVE) ×4 IMPLANT
GOWN STRL REUS W/TWL XL LVL3 (GOWN DISPOSABLE) ×8 IMPLANT
HANDPIECE INTERPULSE COAX TIP (DISPOSABLE) ×1
IMMOBILIZER KNEE 20 (SOFTGOODS) ×2
IMMOBILIZER KNEE 20 THIGH 36 (SOFTGOODS) ×1 IMPLANT
MANIFOLD NEPTUNE II (INSTRUMENTS) ×2 IMPLANT
NS IRRIG 1000ML POUR BTL (IV SOLUTION) IMPLANT
PACK TOTAL KNEE CUSTOM (KITS) ×2 IMPLANT
POSITIONER SURGICAL ARM (MISCELLANEOUS) ×2 IMPLANT
SET HNDPC FAN SPRY TIP SCT (DISPOSABLE) ×1 IMPLANT
SPONGE GAUZE 2X2 STER 10/PKG (GAUZE/BANDAGES/DRESSINGS)
SPONGE SURGIFOAM ABS GEL 100 (HEMOSTASIS) IMPLANT
STAPLER VISISTAT (STAPLE) IMPLANT
STRIP CLOSURE SKIN 1/2X4 (GAUZE/BANDAGES/DRESSINGS) ×2 IMPLANT
SUT BONE WAX W31G (SUTURE) ×2 IMPLANT
SUT MNCRL AB 4-0 PS2 18 (SUTURE) IMPLANT
SUT VIC AB 1 CT1 27 (SUTURE) ×2
SUT VIC AB 1 CT1 27XBRD ANTBC (SUTURE) ×2 IMPLANT
SUT VIC AB 2-0 CT1 27 (SUTURE) ×3
SUT VIC AB 2-0 CT1 TAPERPNT 27 (SUTURE) ×3 IMPLANT
SUT VLOC 180 0 24IN GS25 (SUTURE) ×2 IMPLANT
SYR 50ML LL SCALE MARK (SYRINGE) ×2 IMPLANT
TOWER CARTRIDGE SMART MIX (DISPOSABLE) ×2 IMPLANT
TRAY FOLEY W/METER SILVER 14FR (SET/KITS/TRAYS/PACK) ×2 IMPLANT
TRAY FOLEY W/METER SILVER 16FR (SET/KITS/TRAYS/PACK) IMPLANT
WATER STERILE IRR 1500ML POUR (IV SOLUTION) ×4 IMPLANT
WRAP KNEE MAXI GEL POST OP (GAUZE/BANDAGES/DRESSINGS) ×2 IMPLANT
YANKAUER SUCT BULB TIP 10FT TU (MISCELLANEOUS) IMPLANT

## 2015-02-02 NOTE — Evaluation (Signed)
Physical Therapy Evaluation Patient Details Name: [name] MRN: [id number] DOB: Mar 16, 1933 Today's Date: 02/02/2015   History of Present Illness  R TKR  Clinical Impression  Pt s/p R TKR presents with decreased R LE strength/ROM and post op pain limiting functional mobility.  Pt would benefit from follow up rehab at SNF level to maximize IND and safety prior to return home alone.    Follow Up Recommendations SNF    Equipment Recommendations  None recommended by PT    Recommendations for Other Services OT consult     Precautions / Restrictions Precautions Precautions: Knee;Fall Required Braces or Orthoses: Knee Immobilizer - Right Knee Immobilizer - Right: Discontinue once straight leg raise with < 10 degree lag Restrictions Weight Bearing Restrictions: No Other Position/Activity Restrictions: WBAT      Mobility  Bed Mobility Overal bed mobility: Needs Assistance Bed Mobility: Supine to Sit     Supine to sit: Min assist;Mod assist     General bed mobility comments: cues for sequence and use of L LE to self assist  Transfers Overall transfer level: Needs assistance Equipment used: Rolling walker (2 wheeled) Transfers: Sit to/from Stand Sit to Stand: Min assist;From elevated surface         General transfer comment: cues for LE management and use of UEs to self assist  Ambulation/Gait Ambulation/Gait assistance: Min assist;Mod assist Ambulation Distance (Feet): 6 Feet Assistive device: Rolling walker (2 wheeled) Gait Pattern/deviations: Step-to pattern;Decreased step length - right;Decreased step length - left;Shuffle Gait velocity: decr   General Gait Details: cues for sequence, posture and position from [employer]            Wheelchair Mobility    Modified Rankin (Stroke Patients Only)       Balance                                             Pertinent Vitals/Pain Pain Assessment: 0-10 Pain Score: 5  Pain  Location: R knee Pain Descriptors / Indicators: Aching;Sore Pain Intervention(s): Limited activity within patient's tolerance;Monitored during session;Premedicated before session    Home Living Family/patient expects to be discharged to:: Skilled nursing facility Living Arrangements: Alone                    Prior Function Level of Independence: Independent               Hand Dominance        Extremity/Trunk Assessment   Upper Extremity Assessment: Overall WFL for tasks assessed           Lower Extremity Assessment: RLE deficits/detail      Cervical / Trunk Assessment: Normal  Communication   Communication: No difficulties  Cognition Arousal/Alertness: Awake/alert Behavior During Therapy: WFL for tasks assessed/performed Overall Cognitive Status: Within Functional Limits for tasks assessed                      General Comments      Exercises        Assessment/Plan    PT Assessment Patient needs continued PT services  PT Diagnosis Difficulty walking   PT Problem List Decreased strength;Decreased range of motion;Decreased activity tolerance;Decreased mobility;Decreased knowledge of use of DME;Pain  PT Treatment Interventions DME instruction;Gait training;Stair training;Functional mobility training;Therapeutic activities;Therapeutic exercise;Patient/family education   PT Goals (Current goals can be found in  the Care Plan section) Acute Rehab PT Goals Patient Stated Goal: Resume previous active lifestyle with decreased pain PT Goal Formulation: With patient Time For Goal Achievement: 02/07/15 Potential to Achieve Goals: Good    Frequency 7X/week   Barriers to discharge        Co-evaluation               End of Session                 Time: 6962-9528 PT Time Calculation (min) (ACUTE ONLY): 23 min   Charges:   PT Evaluation $PT Eval Low Complexity: 1 Procedure PT Treatments $Gait Training: 8-22 mins   PT G  Codes:        Shirley Bolle 02/14/15, 5:33 PM

## 2015-02-02 NOTE — Progress Notes (Signed)
X-ray results noted 

## 2015-02-02 NOTE — Op Note (Signed)
[name], [name]            ACCOUNT NO.:  [id number]  MEDICAL RECORD NO.:  [id number]  LOCATION:  [facility]                         FACILITY:  [facility]  PHYSICIAN:  Jene Every, M.D.    DATE OF BIRTH:  1933-11-18  DATE OF PROCEDURE:  02/02/2015 DATE OF DISCHARGE:                              OPERATIVE REPORT   PREOPERATIVE DIAGNOSIS:  Osteoarthritis end-stage, right knee.  POSTOPERATIVE DIAGNOSIS:  Osteoarthritis end-stage, right knee.  PROCEDURE PERFORMED:  Right total knee arthroplasty utilizing DePuy Attune rotating platform, 4 femur, 4 tibia, 7 insert, 35 patella.  ANESTHESIA:  Spinal.  HISTORY:  81, bone-on-bone arthrosis refractory to conservative treatment, indicated for replacement of degenerated joint.  Risks and benefits were discussed including bleeding, infection, damage to neurovascular structures, DVT, PE, anesthetic complications, etc.  TECHNIQUE:  Patient in supine position, after induction of adequate general anesthesia 2 g Kefzol, right lower extremity was prepped and draped and exsanguinated in usual sterile fashion.  Thigh tourniquet inflated to 275 mmHg.  Midline incision was then made over the knee. Full-thickness flaps developed.  Median parapatellar arthrotomy performed.  Patella everted and knee flexed.  Copious portions of clear synovial fluid evacuated.  Tricompartmental bone-on-bone arthrosis was noted.  Elevated soft tissues medially preserving the MCL.  I removed the remnants of medial and lateral menisci and the ACL.  Step drill utilized and the femoral canal was irrigated.  Chose a 5 degree right, sized off the anterior femur to a 9.  This was pinned and 3 degrees of external rotation.  I performed anterior and posterior chamfer cuts. There was no notching of the femur noted.  We then turned our attention towards the tibia, was subluxed with Catha Gosselin, external alignment guide, 2 off the defect, which was medial, bisecting the tibiotalar joint,  3 degree posterior slope.  Parallel with the shaft, pinned, performed our oscillating saw to perform our tibial cut.  We tried our flexion and extension gap.  They were equivalent.  We then turned our attention towards the bleeding, the tibia was subluxed.  Retractors were used.  We trialed the tibia just to the medial aspect of tibial tubercle with the 4, maximized our coverage.  Drilled essentially, used our punch guide. We then used a femur box cut.  With the trial femur, box cut was performed utilizing the Attune box cut jig guide.  We then used a trial femur and tibia.  Then a 6 insert, we had full extension and flexion. She has seemed to have just some asymmetry and rotation of the tibia in comparison to the femur.  We used towel clips to secure the arthrotomy and it still appeared that there was some slight external rotation, I felt revising the tibia was appropriate.  We then flexed the knee, removed the femur, took our 4 tibial tray, and used left external rotation with full coverage, pinned it, drilled essentially, used our punch guide and then placed our femur back on that and the trial insert in it was fit satisfactory.  We then turned attention towards the patella, measured at 22, we planed 7.5 from that using the external alignment guide, and we planed it, measured and after that was a 15.  We drilled 3 PEG holes used in a 35 trial medializing those PEG holes. Placed a trial patella with excellent patellofemoral tracking.  We removed all instruments, copiously irrigated the joint with pulsatile lavage, checked posteriorly, cauterized our geniculates.  The capsule was intact as was the popliteus.  After that, we flexed the knee, subluxed the tibia, used retractors, thoroughly dried all surfaces, mixed the cement on the back table, injected into the tibia under pressure, digitally pressurizing it, impacted the 4 tibial tray.  We then cemented and impacted the femur and placed  a 5 insert.  Held it in extension with an axial load applied throughout the curing of the cement.  We cemented the patellar component as well.  After curing of the cement, we removed all redundant cement.  We had full extension, full flexion, good stability, varus and valgus stressing 0-30 degrees. We then removed the trial, placed a permanent 5 insert.  We had full extension and full flexion; however with a varus stress to the knee, it seemed to open up slightly in the mid flexion.  We then changed that out to a 7 insert and that stabilized at mid flexion.  Full range of motion and just prior to that, we copiously irrigated the wound with pulsatile lavage and antibiotic irrigation.  We placed our permanent 7 insert. Again full extension, full flexion, good stability, varus and valgus stressing 0-30 degrees and excellent patellofemoral tracking.  We released the tourniquet, cauterized any vessels.  Bone wax was placed in the cancellous exposed areas.  We had placed bone plug in the femoral canal.  There was no active bleeding noted from that.  Copiously irrigated with pulsatile lavage.  Slight flexion, reapproximated the patellar arthrotomy with 1 Vicryl and then oversewn with a running V- Loc.  She had full flexion and extension and good stability with varus and valgus stressing 0-30 degrees following this.  Copiously irrigated the wound.  We closed the subcu with 2-0 and skin with subcuticular Prolene.  She had flexion to gravity at 90 degrees and excellent patellofemoral tracking and good stability.  Wound was dressed sterilely.  Immobilizer placed.  She was then placed on the hospital bed and transported to the recovery room in satisfactory condition.  The patient tolerated the procedure well.  No complications.  Assistant, Andrez Grime, [place].  Tourniquet time was 77 minutes.     Jene Every, M.D.     [name]  D:  02/02/2015  T:  02/02/2015  Job:  [id number]

## 2015-02-02 NOTE — Clinical Social Work Note (Signed)
Clinical Social Work Assessment  Patient Details  Name: [name] MRN: [id number] Date of Birth: 07-19-1933  Date of referral:  02/02/15               Reason for consult:  Facility Placement, Discharge Planning                Permission sought to share information with:  Chartered certified accountant granted to share information::  Yes, Verbal Permission Granted  Name::        Agency::     Relationship::     Contact Information:     Housing/Transportation Living arrangements for the past 2 months:  Single Family Home Source of Information:  Patient, Adult Children Patient Interpreter Needed:  None Criminal Activity/Legal Involvement Pertinent to Current Situation/Hospitalization:  No - Comment as needed Significant Relationships:  Adult Children Lives with:  Self Do you feel safe going back to the place where you live?  No (SNF placement requested.) Need for family participation in patient care:  Yes (Comment)  Care giving concerns: Pt's care cannot be managed at home following hospital d/c.   Social Worker assessment / plan:  Pt hospitalized on 02/02/15 for pre planned right knee total arthroplasty. CSW met with pt / daughter to assist with d/c planning. MD has recommended ST Rehab at d/c. PT eval is pending. Pt has made prior arrangements to have ST Rehab at [facility] at d/c. CSW has contacted SNF and d/c plan has been confirmed pending [employer] Medicare authorization. CSW will continue to follow to assist with d/c planning to SNF.  Employment status:  Retired Nurse, adult PT Recommendations:  Not assessed at this time Information / Referral to community resources:  [place]  Patient/Family's Response to care:  Pt / daughter feel rehab is needed.  Patient/Family's Understanding of and Emotional Response to Diagnosis, Current Treatment, and Prognosis:  Pt / daughter are aware of pt's medical status. Pt is  relived surgery is over and all went well. Pt is motivated to work with therapy. " I haven't moved well in so long. I know things will get better. "  Emotional Assessment Appearance:  Appears stated age Attitude/Demeanor/Rapport:  Other (cooperative) Affect (typically observed):  Calm, Pleasant Orientation:  Oriented to Self, Oriented to Place, Oriented to  Time, Oriented to Situation Alcohol / Substance use:  Not Applicable Psych involvement (Current and /or in the community):  No (Comment)  Discharge Needs  Concerns to be addressed:  Discharge Planning Concerns Readmission within the last 30 days:  No Current discharge risk:  None Barriers to Discharge:  No Barriers Identified   Loraine Maple  [phone] 02/02/2015, 4:25 PM

## 2015-02-02 NOTE — Progress Notes (Signed)
Utilization review completed.  

## 2015-02-02 NOTE — Clinical Social Work Placement (Signed)
   CLINICAL SOCIAL WORK PLACEMENT  NOTE  Date:  02/02/2015  Patient Details  Name: [name] MRN: [id number] Date of Birth: 04/18/1933  Clinical Social Work is seeking post-discharge placement for this patient at the Skilled  Nursing Facility level of care (*CSW will initial, date and re-position this form in  chart as items are completed):  No   Patient/family provided with [facility] Clinical Social Work Department's list of facilities offering this level of care within the geographic area requested by the patient (or if unable, by the patient's family).  Yes   Patient/family informed of their freedom to choose among providers that offer the needed level of care, that participate in Medicare, Medicaid or managed care program needed by the patient, have an available bed and are willing to accept the patient.  Yes   Patient/family informed of [facility]'s ownership interest in [facility] and [facility], as well as of the fact that they are under no obligation to receive care at these facilities.  PASRR submitted to EDS on 01/26/15     PASRR number received on 02/02/15     Existing PASRR number confirmed on       FL2 transmitted to all facilities in geographic area requested by pt/family on 02/02/15     FL2 transmitted to all facilities within larger geographic area on       Patient informed that his/her managed care company has contracts with or will negotiate with certain facilities, including the following:        Yes   Patient/family informed of bed offers received.  Patient chooses bed at [facility]     Physician recommends and patient chooses bed at      Patient to be transferred to [facility] on  .  Patient to be transferred to facility by       Patient family notified on   of transfer.  Name of family member notified:        PHYSICIAN       Additional Comment:    _______________________________________________ Vennie Homans  [phone] 02/02/2015, 4:31 PM

## 2015-02-02 NOTE — Transfer of Care (Signed)
Immediate Anesthesia Transfer of Care Note  Patient: [name]  Procedure(s) Performed: Procedure(s): TOTAL RIGHT KNEE ARTHROPLASTY (Right)  Patient Location: PACU  Anesthesia Type:MAC and Spinal  Level of Consciousness:  sedated, patient cooperative and responds to stimulation  Airway & Oxygen Therapy:Patient Spontanous Breathing and Patient connected to face mask oxgen  Post-op Assessment:  Report given to PACU RN and Post -op Vital signs reviewed and stable  Post vital signs:  Reviewed and stable  Last Vitals:  Filed Vitals:   02/02/15 0535  BP: 169/64  Pulse: 90  Temp: 36.6 C  Resp: 18    Complications: No apparent anesthesia complications

## 2015-02-02 NOTE — H&P (View-Only) (Signed)
[name] DOB: Sep 02, 1933 Widowed / Language: [place] / Race: White Female  H&P Date: 01/25/15  Chief Complaint: Right knee pain  History of Present Illness  The patient is a 82 year old female who comes in today for a preoperative History and Physical. The patient is scheduled for a right total knee arthroplasty to be performed by Dr. Javier Docker, MD at [facility] on 02-02-2015. [name] reports ongoing R knee pain refractory to both cortisone and viscosupplementation injections, bracing, activity modifications, relative rest, quad strengthening, medication. Her pain is interfering with ADL's and quality of life and she desires to proceed with surgery at this point.  Dr. Shelle Iron and the patient mutually agreed to proceed with a Right total knee replacement. Risks and benefits of the procedure were discussed including stiffness, suboptimal range of motion, persistent pain, infection requiring removal of prosthesis and reinsertion, need for prophylactic antibiotics in the future, for example, dental procedures, possible need for manipulation, revision in the future and also anesthetic complications including DVT, PE, etc. We discussed the perioperative course, time in the hospital, postoperative recovery and the need for elevation to control swelling. We also discussed the predicted range of motion and the probability that squatting and kneeling would be unobtainable in the future. In addition, postoperative anticoagulation was discussed. We have obtained preoperative medical clearance as necessary. Provided her illustrated handout and discussed it in detail. They will enroll in the total joint replacement educational forum at the hospital.  Allergies Ampicillin Sodium *PENICILLINS*  Allergies Reconciled   Family History  Heart Disease  Father. Hypertension  Mother. Rheumatoid Arthritis  Sister. Cancer  Brother, Sister. Cerebrovascular Accident  Mother. Congestive Heart  Failure  Father. First Degree Relatives   Social History  Tobacco use  Never smoker. 10/19/2014 Children  2 Current work status  retired Never consumed alcohol  10/19/2014: Never consumed alcohol Exercise  Exercises daily; does other Living situation  live alone Marital status  widowed No history of drug/alcohol rehab  Not under pain contract  Number of flights of stairs before winded  2-3 Tobacco / smoke exposure  10/19/2014: no Post-Surgical Plans  [place] [facility] then home. Has 2 daughters nearby to help.  Medication History Aleve (  Capsule, 1 (one) Oral) Active. (prn) Synthroid ( Tablet, Oral) Active. Medications Reconciled  Pregnancy / Birth History Pregnant  no  Past Surgical History  Gallbladder Surgery  laporoscopic 1993 Tubal Ligation  Foot Surgery - Right  bunionectomy, 2014  Past Medical Hx Hypercholesterolemia  Gastroesophageal Reflux Disease  Hypothyroidism  Valve Disorder (one valve clicks)  Osteoarthritis  Hemorrhoids   Review of Systems General Not Present- Chills, Fatigue, Fever, Memory Loss, Night Sweats, Weight Gain and Weight Loss. Skin Not Present- Eczema, Hives, Itching, Lesions and Rash. HEENT Not Present- Dentures, Double Vision, Headache, Hearing Loss, Tinnitus and Visual Loss. Respiratory Not Present- Allergies, Chronic Cough, Coughing up blood, Shortness of breath at rest and Shortness of breath with exertion. Cardiovascular Not Present- Chest Pain, Difficulty Breathing Lying Down, Murmur, Palpitations, Racing/skipping heartbeats and Swelling. Gastrointestinal Not Present- Abdominal Pain, Bloody Stool, Constipation, Diarrhea, Difficulty Swallowing, Heartburn, Jaundice, Loss of appetitie, Nausea and Vomiting. Female Genitourinary Not Present- Blood in Urine, Discharge, Flank Pain, Incontinence, Painful Urination, Urgency, Urinary frequency, Urinary Retention, Urinating at Night and Weak urinary  stream. Musculoskeletal Present- Joint Pain, Joint Stiffness and Morning Stiffness. Not Present- Back Pain, Joint Swelling, Muscle Pain, Muscle Weakness and Spasms. Neurological Not Present- Blackout spells, Difficulty with balance, Dizziness, Paralysis, Tremor and  Weakness. Psychiatric Not Present- Insomnia.  Vitals 01/25/2015 2:01 PM Weight: 140 lb Height: 58.5in Body Surface Area: 1.57 m Body Mass Index: 28.76 kg/m  Pulse: 95 (Regular)  BP: 155/73 (Sitting, Left Arm, Standard)  Physical Exam General Mental Status -Alert, cooperative and good historian. General Appearance-pleasant, Not in acute distress. Orientation-Oriented X3. Build & Nutrition-Well nourished and Well developed.  Head and Neck Head-normocephalic, atraumatic . Neck Global Assessment - supple, no bruit auscultated on the right, no bruit auscultated on the left.  Eye Pupil - Bilateral-Regular and Round. Motion - Bilateral-EOMI.  Chest and Lung Exam Auscultation Breath sounds - clear at anterior chest wall and clear at posterior chest wall. Adventitious sounds - No Adventitious sounds.  Cardiovascular Auscultation Rhythm - Regular rate and rhythm. Heart Sounds - S1 WNL and S2 WNL. Murmurs & Other Heart Sounds - Auscultation of the heart reveals - No Murmurs.  Abdomen Palpation/Percussion Tenderness - Abdomen is non-tender to palpation. Rigidity (guarding) - Abdomen is soft. Auscultation Auscultation of the abdomen reveals - Bowel sounds normal.  Female Genitourinary Not done, not pertinent to present illness  Musculoskeletal On exam, well nourished, well developed. She does have a slightly antalgic gait, awake, alert and oriented x3. Right knee tender to palpation medial joint line and in the anteromedial tibia. Nontender over the lateral joint line, patella, patellar tendon, quad tendon, peroneal nerve, fibular head, popliteal space. There is no calf pain, no sign of DVT. No pain  or laxity with varus or valgus stress. Negative McMurray's. Negative patellofemoral crepitus. She does have a trace effusion. Range of motion approximately -5 to 95 degrees.  Imaging Prior x-rays and MRI reviewed. Bone on bone arthrosis, medial compartment. Moderately severe and essentially bone on bone on the left knee medial compartment. She does have intense marrow edema in the medial femoral condyle of the right knee as noted on her MRI consistent with a stress reaction, changed with her severe degenerative joint disease.  Assessment & Plan Primary osteoarthritis of right knee (M17.11)  Pt with end-stage Right knee DJD, bone-on-bone, refractory to conservative tx, scheduled for Right total knee replacement by Dr. Shelle Iron on 02/02/15. We again discussed the procedure itself as well as risks, complications and alternatives, including but not limited to DVT, PE, infx, bleeding, failure of procedure, need for secondary procedure including manipulation, nerve injury, ongoing pain/symptoms, anesthesia risk, even stroke or death. Also discussed typical post-op protocols, activity restrictions, need for PT, flexion/extension exercises, time out of work. Discussed need for DVT ppx post-op with Xarelto then ASA per protocol. Discussed dental ppx. Also discussed limitations post-operatively such as kneeling and squatting. All questions were answered. Patient desires to proceed with surgery as scheduled. Will hold ASA and NSAIDs accordingly. Will remain NPO after MN night before surgery. Will present to [facility] for pre-op testing. Plan Xarelto 2 weeks post-op for DVT ppx then ASA. Plan pain medication, Colace. Plan D/C to [facility] then home with HHPT vs. outpatient PT with family members at home for assistance. Will follow up 10-14 days post-op for suture removal and xrays.  Plan Right total knee arthroplasty  Signed electronically by Dorothy Spark, PA-C for Dr. Shelle Iron

## 2015-02-02 NOTE — Interval H&P Note (Signed)
History and Physical Interval Note:  02/02/2015 7:21 AM  [name]  has presented today for surgery, with the diagnosis of right knee djd  The various methods of treatment have been discussed with the patient and family. After consideration of risks, benefits and other options for treatment, the patient has consented to  Procedure(s): TOTAL RIGHT KNEE ARTHROPLASTY (Right) as a surgical intervention .  The patient's history has been reviewed, patient examined, no change in status, stable for surgery.  I have reviewed the patient's chart and labs.  Questions were answered to the patient's satisfaction.     Brennan Karam C

## 2015-02-02 NOTE — Progress Notes (Signed)
Portable AP and Lateral Right Knee X-rays done. 

## 2015-02-02 NOTE — Anesthesia Postprocedure Evaluation (Signed)
Anesthesia Post Note  Patient: NORENA BRATTON  Procedure(s) Performed: Procedure(s) (LRB): TOTAL RIGHT KNEE ARTHROPLASTY (Right)  Patient location during evaluation: PACU Anesthesia Type: Spinal Level of consciousness: oriented and awake and alert Pain management: pain level controlled Vital Signs Assessment: post-procedure vital signs reviewed and stable Respiratory status: spontaneous breathing, respiratory function stable and patient connected to nasal cannula oxygen Cardiovascular status: blood pressure returned to baseline and stable Postop Assessment: no headache, no backache and spinal receding Anesthetic complications: no Comments: Purposeful movement bilateral legs.    Last Vitals:  Filed Vitals:   02/02/15 1147 02/02/15 1250  BP: 158/66 151/71  Pulse: 78 72  Temp: 36.6 C 36.7 C  Resp: 12 14    Last Pain:  Filed Vitals:   02/02/15 1303  PainSc: 5                  Sem Mccaughey J

## 2015-02-02 NOTE — Anesthesia Procedure Notes (Signed)
Spinal Patient location during procedure: OR End time: 02/02/2015 7:28 AM Staffing Anesthesiologist: Franne Grip Resident/CRNA: Maxwell Caul Performed by: anesthesiologist and resident/CRNA  Preanesthetic Checklist Completed: patient identified, site marked, surgical consent, pre-op evaluation, timeout performed, IV checked, risks and benefits discussed and monitors and equipment checked Spinal Block Patient position: sitting Prep: Betadine Patient monitoring: heart rate, continuous pulse ox and blood pressure Location: L3-4 Injection technique: single-shot Needle Needle type: Spinocan  Needle gauge: 22 G Needle length: 9 cm Additional Notes Expiration date of kit checked and confirmed. Patient tolerated procedure well, without complications. One attempt by CRNA, needle repositioned and placement by Dr Delma Post. LOT # [id number], Exp 05-06-2016.

## 2015-02-02 NOTE — NC FL2 (Signed)
[place] MEDICAID FL2 LEVEL OF CARE SCREENING TOOL     IDENTIFICATION  Patient Name: [name] Birthdate: [birth year] Sex: female Admission Date (Current Location): 02/02/2015  [facility] and [place] Number:  Producer, television/film/video and Address:  [facility],  [address] [place], [place] [address]      Provider Number: [id number]  Attending Physician Name and Address:  Jene Every, MD  Relative Name and Phone Number:       Current Level of Care: Hospital Recommended Level of Care: Skilled Nursing Facility Prior Approval Number:    Date Approved/Denied:   PASRR Number: [id number]  Discharge Plan: SNF    Current Diagnoses: Patient Active Problem List   Diagnosis Date Noted  . Primary osteoarthritis of right knee 02/02/2015  . Right knee DJD 02/02/2015    Orientation RESPIRATION BLADDER Height & Weight    Self, Time, Situation, Place  Normal Continent  (147.3 cm) 147 lbs.  BEHAVIORAL SYMPTOMS/MOOD NEUROLOGICAL BOWEL NUTRITION STATUS  Other (Comment) (no behaviors)   Continent Diet  AMBULATORY STATUS COMMUNICATION OF NEEDS Skin   Extensive Assist Verbally Surgical wounds                       Personal Care Assistance Level of Assistance  Bathing, Feeding, Dressing Bathing Assistance: Limited assistance Feeding assistance: Independent Dressing Assistance: Limited assistance     Functional Limitations Info  Sight, Hearing, Speech Sight Info: Adequate Hearing Info: Adequate Speech Info: Adequate    SPECIAL CARE FACTORS FREQUENCY  PT (By licensed PT), OT (By licensed OT)     PT Frequency: 5 x wk OT Frequency: 5 x wk            Contractures Contractures Info: Not present    Additional Factors Info  Code Status Code Status Info: Full Code             Current Medications (02/02/2015):  This is the current hospital active medication list Current Facility-Administered Medications  Medication Dose Route Frequency  Provider Last Rate Last Dose  . acetaminophen (TYLENOL) tablet 650 mg  650 mg Oral Q6H PRN Jene Every, MD       Or  . acetaminophen (TYLENOL) suppository 650 mg  650 mg Rectal Q6H PRN Jene Every, MD      . acidophilus (RISAQUAD) capsule 1 capsule  1 capsule Oral Daily Jene Every, MD   1 capsule at 02/02/15 1400  . alum & mag hydroxide-simeth (MAALOX/MYLANTA) 200-200-20 MG/5ML suspension 30 mL  30 mL Oral Q4H PRN Jene Every, MD      . artificial tears (LACRILUBE) ophthalmic ointment   Both Eyes BID Jene Every, MD      . bisacodyl (DULCOLAX) EC tablet 5 mg  5 mg Oral Daily PRN Jene Every, MD      . ceFAZolin (ANCEF) IVPB 2 g/50 mL premix  2 g Intravenous Q6H Jene Every, MD   2 g at 02/02/15 1255  . dextrose 5 % and 0.45 % NaCl with KCl 20 mEq/L infusion   Intravenous Continuous Jene Every, MD 50 mL/hr at 02/02/15 1256    . diphenhydrAMINE (BENADRYL) 12.5 MG/5ML elixir 12.5-25 mg  12.5-25 mg Oral Q4H PRN Jene Every, MD      . docusate sodium (COLACE) capsule 100 mg  100 mg Oral BID Jene Every, MD      . HYDROmorphone (DILAUDID) injection 0.5 mg  0.5 mg Intravenous Q2H PRN Jene Every, MD      . [  START ON 02/03/2015] levothyroxine (SYNTHROID, LEVOTHROID) tablet 88 mcg  88 mcg Oral QAC breakfast Jene Every, MD      . magnesium citrate solution 1 Bottle  1 Bottle Oral Once PRN Jene Every, MD      . magnesium oxide (MAG-OX) tablet 200 mg  200 mg Oral Daily Jene Every, MD   200 mg at 02/02/15 1400  . menthol-cetylpyridinium (CEPACOL) lozenge 3 mg  1 lozenge Oral PRN Jene Every, MD       Or  . phenol (CHLORASEPTIC) mouth spray 1 spray  1 spray Mouth/Throat PRN Jene Every, MD      . methocarbamol (ROBAXIN) tablet 500 mg  500 mg Oral Q6H PRN Jene Every, MD       Or  . methocarbamol (ROBAXIN) 500 mg in dextrose 5 % 50 mL IVPB  500 mg Intravenous Q6H PRN Jene Every, MD   500 mg at 02/02/15 1041  . metoCLOPramide (REGLAN) tablet 5-10 mg  5-10 mg Oral Q8H  PRN Jene Every, MD       Or  . metoCLOPramide (REGLAN) injection 5-10 mg  5-10 mg Intravenous Q8H PRN Jene Every, MD      . ondansetron [facility]) tablet 4 mg  4 mg Oral Q6H PRN Jene Every, MD       Or  . ondansetron [facility]) injection 4 mg  4 mg Intravenous Q6H PRN Jene Every, MD      . oxyCODONE (Oxy IR/ROXICODONE) immediate release tablet 5-10 mg  5-10 mg Oral Q3H PRN Jene Every, MD   10 mg at 02/02/15 1557  . pyridOXINE (VITAMIN B-6) tablet 200 mg  200 mg Oral Daily Jene Every, MD   200 mg at 02/02/15 1400  . [START ON 02/03/2015] rivaroxaban (XARELTO) tablet 10 mg  10 mg Oral Q breakfast Jene Every, MD      . senna-docusate (Senokot-S) tablet 1 tablet  1 tablet Oral QHS PRN Jene Every, MD         Discharge Medications: Please see discharge summary for a list of discharge medications.  Relevant Imaging Results:  Relevant Lab Results:   Additional Information SS # [id number]  Shanyn Preisler, Dickey Gave, LCSW

## 2015-02-02 NOTE — Brief Op Note (Signed)
02/02/2015  9:35 AM  PATIENT:  [name]  82 y.o. female  PRE-OPERATIVE DIAGNOSIS:  right knee djd  POST-OPERATIVE DIAGNOSIS:  right knee djd  PROCEDURE:  Procedure(s): TOTAL RIGHT KNEE ARTHROPLASTY (Right)  SURGEON:  Surgeon(s) and Role:    * Jene Every, MD - Primary  PHYSICIAN ASSISTANT:   ASSISTANTS: Bissell   ANESTHESIA:   general  EBL:  Total I/O In: 1000 [I.V.:1000] Out: 650 [Urine:650]  BLOOD ADMINISTERED:none  DRAINS: none   LOCAL MEDICATIONS USED:  MARCAINE     SPECIMEN:  No Specimen  DISPOSITION OF SPECIMEN:  N/A  COUNTS:  YES  TOURNIQUET:   Total Tourniquet Time Documented: Thigh (Right) - 77 minutes Total: Thigh (Right) - 77 minutes   DICTATION: .Other Dictation: Dictation Number  [id number]  PLAN OF CARE: Admit for overnight observation  PATIENT DISPOSITION:  PACU - hemodynamically stable.   Delay start of Pharmacological VTE agent (>24hrs) due to surgical blood loss or risk of bleeding: no

## 2015-02-03 LAB — CBC
HCT: 37.7 % (ref 36.0–46.0)
HEMOGLOBIN: 12.4 g/dL (ref 12.0–15.0)
MCH: 29.5 pg (ref 26.0–34.0)
MCHC: 32.9 g/dL (ref 30.0–36.0)
MCV: 89.8 fL (ref 78.0–100.0)
Platelets: 211 10*3/uL (ref 150–400)
RBC: 4.2 MIL/uL (ref 3.87–5.11)
RDW: 14.3 % (ref 11.5–15.5)
WBC: 9.7 10*3/uL (ref 4.0–10.5)

## 2015-02-03 LAB — BASIC METABOLIC PANEL
ANION GAP: 7 (ref 5–15)
BUN: 7 mg/dL (ref 6–20)
CHLORIDE: 101 mmol/L (ref 101–111)
CO2: 26 mmol/L (ref 22–32)
Calcium: 8.7 mg/dL — ABNORMAL LOW (ref 8.9–10.3)
Creatinine, Ser: 0.59 mg/dL (ref 0.44–1.00)
GFR calc Af Amer: >60 mL/min (ref >60)
GFR calc non Af Amer: >60 mL/min (ref >60)
Glucose, Bld: 163 mg/dL — ABNORMAL HIGH (ref 65–99)
POTASSIUM: 4.1 mmol/L (ref 3.5–5.1)
SODIUM: 134 mmol/L — AB (ref 135–145)

## 2015-02-03 NOTE — Progress Notes (Signed)
Physical Therapy Treatment Patient Details Name: [name] MRN: [id number] DOB: July 05, 1933 Today's Date: 02-21-15    History of Present Illness R TKR    PT Comments    Pt cooperative but ltd this am by pain and nausea.  Follow Up Recommendations  SNF     Equipment Recommendations  None recommended by PT    Recommendations for Other Services OT consult     Precautions / Restrictions Precautions Precautions: Knee;Fall Required Braces or Orthoses: Knee Immobilizer - Right Knee Immobilizer - Right: Discontinue once straight leg raise with < 10 degree lag Restrictions Weight Bearing Restrictions: No Other Position/Activity Restrictions: WBAT    Mobility  Bed Mobility               General bed mobility comments: OOB with OT  Transfers Overall transfer level: Needs assistance Equipment used: Rolling walker (2 wheeled) Transfers: Sit to/from Stand Sit to Stand: Min assist         General transfer comment: cues for UE/LE placement  Ambulation/Gait Ambulation/Gait assistance: Min assist Ambulation Distance (Feet): 8 Feet Assistive device: Rolling walker (2 wheeled) Gait Pattern/deviations: Step-to pattern;Decreased step length - right;Decreased step length - left;Shuffle;Trunk flexed;Antalgic Gait velocity: decr   General Gait Details: cues for sequence, posture and position from RW; distance ltd by pain   Stairs            Wheelchair Mobility    Modified Rankin (Stroke Patients Only)       Balance                                    Cognition Arousal/Alertness: Awake/alert Behavior During Therapy: WFL for tasks assessed/performed Overall Cognitive Status: Within Functional Limits for tasks assessed                      Exercises Total Joint Exercises Ankle Circles/Pumps: AROM;Both;15 reps;Supine Quad Sets: AROM;Both;10 reps;Supine Heel Slides: AAROM;Right;10 reps;Supine Straight Leg Raises:  AAROM;Right;10 reps;Supine    General Comments        Pertinent Vitals/Pain Pain Assessment: 0-10 Pain Score: 7  Pain Location: R knee Pain Descriptors / Indicators: Aching;Sore Pain Intervention(s): Limited activity within patient's tolerance;Monitored during session;Premedicated before session;Ice applied    Home Living                      Prior Function            PT Goals (current goals can now be found in the care plan section) Acute Rehab PT Goals Patient Stated Goal: Resume previous active lifestyle with decreased pain PT Goal Formulation: With patient Time For Goal Achievement: 02/07/15 Potential to Achieve Goals: Good Progress towards PT goals: Progressing toward goals    Frequency  7X/week    PT Plan Current plan remains appropriate    Co-evaluation             End of Session Equipment Utilized During Treatment: Gait belt;Right knee immobilizer Activity Tolerance: Patient limited by fatigue;Patient limited by pain Patient left: in chair;with call bell/phone within reach     Time: 1132-1202 PT Time Calculation (min) (ACUTE ONLY): 30 min  Charges:  $Gait Training: 8-22 mins $Therapeutic Exercise: 8-22 mins                    G Codes:      Ashara Lounsbury 2015-02-21, 2:19 PM

## 2015-02-03 NOTE — Discharge Summary (Deleted)
Physician Discharge Summary   Patient ID: [name] MRN: [id number] DOB/AGE: 05/25/1933 82 y.o.  Admit date: 02/02/2015 Discharge date: anticipated 02/04/15  Primary Diagnosis: Right knee primary osteoarthritis  Admission Diagnoses:  Past Medical History  Diagnosis Date  . 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 Arthritis   . Rosacea   . GERD (gastroesophageal reflux disease)     infrequently - otc med as needed  . Hypothyroidism    Discharge Diagnoses:   Principal Problem:   Primary osteoarthritis of right knee Active Problems:   Right knee DJD  Estimated body mass index is 30.73 kg/(m^2) as calculated from the following:   Height as of this encounter: '4\' 10"'$  (1.473 m).   Weight as of this encounter: 66.679 kg (147 lb).  Procedure:  Procedure(s) (LRB): TOTAL RIGHT KNEE ARTHROPLASTY (Right)   Consults: None  HPI: see H&P Laboratory Data: Admission on 02/02/2015  Component Date Value Ref Range Status  . WBC 02/03/2015 9.7  4.0 - 10.5 K/uL Final  . RBC 02/03/2015 4.20  3.87 - 5.11 MIL/uL Final  . Hemoglobin 02/03/2015 12.4  12.0 - 15.0 g/dL Final  . HCT 02/03/2015 37.7  36.0 - 46.0 % Final  . MCV 02/03/2015 89.8  78.0 - 100.0 fL Final  . MCH 02/03/2015 29.5  26.0 - 34.0 pg Final  . MCHC 02/03/2015 32.9  30.0 - 36.0 g/dL Final  . RDW 02/03/2015 14.3  11.5 - 15.5 % Final  . Platelets 02/03/2015 211  150 - 400 K/uL Final  . Sodium 02/03/2015 134* 135 - 145 mmol/L Final  . Potassium 02/03/2015 4.1  3.5 - 5.1 mmol/L Final  . Chloride 02/03/2015 101  101 - 111 mmol/L Final  . CO2 02/03/2015 26  22 - 32 mmol/L Final  . Glucose, Bld 02/03/2015 163* 65 - 99 mg/dL Final  . BUN 02/03/2015 7  6 - 20 mg/dL Final  . Creatinine, Ser 02/03/2015 0.59  0.44 - 1.00 mg/dL Final  . Calcium 02/03/2015 8.7* 8.9 - 10.3 mg/dL Final  . GFR calc non Af Amer 02/03/2015 >60  >60 mL/min Final  . GFR calc Af  Amer 02/03/2015 >60  >60 mL/min Final   Comment: (NOTE) The eGFR has been calculated using the CKD EPI equation. This calculation has not been validated in all clinical situations. eGFR's persistently <60 mL/min signify possible Chronic Kidney Disease.   Georgiann Hahn gap 02/03/2015 7  5 - 15 Final  Hospital Outpatient Visit on 01/24/2015  Component Date Value Ref Range Status  . ABO/RH(D) 01/24/2015 A POS   Final  Hospital Outpatient Visit on 01/24/2015  Component Date Value Ref Range Status  . aPTT 01/24/2015 27  24 - 37 seconds Final  . Sodium 01/24/2015 143  135 - 145 mmol/L Final  . Potassium 01/24/2015 4.3  3.5 - 5.1 mmol/L Final  . Chloride 01/24/2015 108  101 - 111 mmol/L Final  . CO2 01/24/2015 26  22 - 32 mmol/L Final  . Glucose, Bld 01/24/2015 140* 65 - 99 mg/dL Final  . BUN 01/24/2015 12  6 - 20 mg/dL Final  . Creatinine, Ser 01/24/2015 0.72  0.44 - 1.00 mg/dL Final  . Calcium 01/24/2015 9.4  8.9 - 10.3 mg/dL Final  . GFR calc non Af Amer 01/24/2015 >60  >60 mL/min Final  . GFR calc Af Amer 01/24/2015 >60  >60 mL/min Final   Comment: (  NOTE) The eGFR has been calculated using the CKD EPI equation. This calculation has not been validated in all clinical situations. eGFR's persistently <60 mL/min signify possible Chronic Kidney Disease.   . Anion gap 01/24/2015 9  5 - 15 Final  . WBC 01/24/2015 6.5  4.0 - 10.5 K/uL Final  . RBC 01/24/2015 4.61  3.87 - 5.11 MIL/uL Final  . Hemoglobin 01/24/2015 13.5  12.0 - 15.0 g/dL Final  . HCT 01/24/2015 41.5  36.0 - 46.0 % Final  . MCV 01/24/2015 90.0  78.0 - 100.0 fL Final  . MCH 01/24/2015 29.3  26.0 - 34.0 pg Final  . MCHC 01/24/2015 32.5  30.0 - 36.0 g/dL Final  . RDW 01/24/2015 14.2  11.5 - 15.5 % Final  . Platelets 01/24/2015 202  150 - 400 K/uL Final  . Prothrombin Time 01/24/2015 13.9  11.6 - 15.2 seconds Final  . INR 01/24/2015 1.05  0.00 - 1.49 Final  . ABO/RH(D) 01/24/2015 A POS   Final  . Antibody Screen 01/24/2015 NEG    Final  . Sample Expiration 01/24/2015 02/05/2015   Final  . Extend sample reason 01/24/2015 NO TRANSFUSIONS OR PREGNANCY IN THE PAST 3 MONTHS   Final  . Color, Urine 01/24/2015 YELLOW  YELLOW Final  . APPearance 01/24/2015 CLEAR  CLEAR Final  . Specific Gravity, Urine 01/24/2015 1.003* 1.005 - 1.030 Final  . pH 01/24/2015 6.0  5.0 - 8.0 Final  . Glucose, UA 01/24/2015 NEGATIVE  NEGATIVE mg/dL Final  . Hgb urine dipstick 01/24/2015 NEGATIVE  NEGATIVE Final  . Bilirubin Urine 01/24/2015 NEGATIVE  NEGATIVE Final  . Ketones, ur 01/24/2015 NEGATIVE  NEGATIVE mg/dL Final  . Protein, ur 01/24/2015 NEGATIVE  NEGATIVE mg/dL Final  . Nitrite 01/24/2015 NEGATIVE  NEGATIVE Final  . Leukocytes, UA 01/24/2015 TRACE* NEGATIVE Final  . MRSA, PCR 01/24/2015 NEGATIVE  NEGATIVE Final  . Staphylococcus aureus 01/24/2015 NEGATIVE  NEGATIVE Final   Comment:        The Xpert SA Assay (FDA approved for NASAL specimens in patients over 59 years of age), is one component of a comprehensive surveillance program.  Test performance has been validated by [facility] for patients greater than or equal to 2 year old. It is not intended to diagnose infection nor to guide or monitor treatment.   . Squamous Epithelial / LPF 01/24/2015 0-5* NONE SEEN Final  . WBC, UA 01/24/2015 0-5  0 - 5 WBC/hpf Final  . RBC / HPF 01/24/2015 0-5  0 - 5 RBC/hpf Final  . Bacteria, UA 01/24/2015 RARE* NONE SEEN Final     X-Rays:Dg Knee 1-2 Views Right  01/24/2015  CLINICAL DATA:  Total knee replacement. EXAM: RIGHT KNEE - 1-2 VIEW COMPARISON:  MRI 10/01/2014. FINDINGS: Prominent tricompartment degenerative change. No acute bony abnormality. Small knee joint effusion cannot be excluded . Peripheral vascular calcification IMPRESSION: 1. Prominent tricompartment degenerative change. Small knee joint effusion cannot be excluded. No acute bony abnormality. 2.  Peripheral vascular disease. Electronically Signed   By: Marcello Moores  Register   On:  01/24/2015 12:57   Dg Knee Right Port  02/02/2015  CLINICAL DATA:  Status post right total knee replacement. EXAM: PORTABLE RIGHT KNEE - 1-2 VIEW COMPARISON:  01/24/2015 FINDINGS: A right total knee prosthesis is in place with expected alignment and appearance. No periprosthetic fracture or other complicating feature is observed. Expected gas in the joint and soft tissues. Expected positioning of a polymer component. There appears to be a tube projecting over the  proximal tibial and fibular shafts. At first glance this resembles the fibula but the wall thickness is too regular for this to be bone. IMPRESSION: 1. Total knee prosthesis in place without periprosthetic fracture or other complicating feature. Electronically Signed   By: Van Clines M.D.   On: 02/02/2015 10:56    EKG: Orders placed or performed during the hospital encounter of 01/24/15  . EKG 12-Lead  . EKG 12-Lead     Hospital Course: [name] is a 82 y.o. who was admitted to [facility]. They were brought to the operating room on 02/02/2015 and underwent Procedure(s): TOTAL RIGHT KNEE ARTHROPLASTY.  Patient tolerated the procedure well and was later transferred to the recovery room and then to the orthopaedic floor for postoperative care.  They were given PO and IV analgesics for pain control following their surgery.  They were given 24 hours of postoperative antibiotics of  Anti-infectives    Start     Dose/Rate Route Frequency Ordered Stop   02/02/15 1400  ceFAZolin (ANCEF) IVPB 2 g/50 mL premix     2 g 100 mL/hr over 30 Minutes Intravenous Every 6 hours 02/02/15 1150 02/03/15 0151   02/02/15 0802  polymyxin B 500,000 Units, bacitracin 50,000 Units in sodium chloride irrigation 0.9 % 500 mL irrigation  Status:  Discontinued       As needed 02/02/15 0802 02/02/15 1000   02/02/15 0600  ceFAZolin (ANCEF) IVPB 2 g/50 mL premix     2 g 100 mL/hr over 30 Minutes Intravenous On call to O.R. 02/02/15 0530  02/02/15 0730     and started on DVT prophylaxis in the form of Xarelto, TED hose and SCDs.   PT and OT were ordered for total joint protocol.  Discharge planning consulted to help with postop disposition and equipment needs.  Patient had a good night on the evening of surgery.  They started to get up OOB with therapy on day one. Continued to work with therapy into day two. By day two, anticipate the patient will have progressed with therapy and be meeting their goals.  Incision was healing well.  Patient was seen in rounds and was ready to go to [place].   Diet: Regular diet Activity:WBAT Follow-up:in 10-14 days Disposition - SNF- [facility] Discharged Condition: good      Medication List    TAKE these medications        docusate sodium 100 MG capsule  Commonly known as:  COLACE  Take 1 capsule (100 mg total) by mouth 2 (two) times daily as needed for mild constipation.     oxyCODONE-acetaminophen 5-325 MG tablet  Commonly known as:  PERCOCET  Take 1 tablet by mouth every 4 (four) hours as needed.     rivaroxaban 10 MG Tabs tablet  Commonly known as:  XARELTO  Take 1 tablet (10 mg total) by mouth daily.      ASK your doctor about these medications        aspirin EC 81 MG tablet  Take 81 mg by mouth every other day.     BIOTIN PO  Take 1 tablet by mouth daily.     CALCIUM-MAGNESIUM-ZINC PO  Take 1 tablet by mouth daily.     GLUCOSAMINE CHONDROITIN PLUS PO  Take 1 tablet by mouth daily.     levothyroxine 88 MCG tablet  Commonly known as:  SYNTHROID, LEVOTHROID  Take 88 mcg by mouth daily before breakfast.     Magnesium 250 MG  Tabs  Take 1 tablet by mouth daily.     SYSTANE OP  Apply 1 drop to eye 2 (two) times daily.     VITAMIN B6 PO  Take 1 tablet by mouth daily.           Follow-up Information    Follow up with BEANE,JEFFREY C, MD In 2 weeks.   Specialty:  Orthopedic Surgery   Why:  For suture removal   Contact information:   [address] [place] [address] [phone]       Signed: Lacie Draft, PA-C Orthopaedic Surgery 02/03/2015, 8:05 AM

## 2015-02-03 NOTE — Progress Notes (Signed)
Physical Therapy Treatment Patient Details Name: [name] MRN: [id number] DOB: 10-Nov-1933 Today's Date: 02/03/2015    History of Present Illness R TKR    PT Comments    Pt progressing slowly with mobility 2* c/o pain with WB.  Follow Up Recommendations  SNF     Equipment Recommendations  None recommended by PT    Recommendations for Other Services OT consult     Precautions / Restrictions Precautions Precautions: Knee;Fall Required Braces or Orthoses: Knee Immobilizer - Right Knee Immobilizer - Right: Discontinue once straight leg raise with < 10 degree lag Restrictions Weight Bearing Restrictions: No Other Position/Activity Restrictions: WBAT    Mobility  Bed Mobility Overal bed mobility: Needs Assistance Bed Mobility: Sit to Supine       Sit to supine: Min assist   General bed mobility comments: cues for sequence and use of L LE to self assist  Transfers Overall transfer level: Needs assistance Equipment used: Rolling walker (2 wheeled) Transfers: Sit to/from Stand Sit to Stand: Min assist;Mod assist         General transfer comment: cues for UE/LE placement and assist to bring wt fwd and balance in standing  Ambulation/Gait Ambulation/Gait assistance: Min assist;Mod assist Ambulation Distance (Feet): 15 Feet Assistive device: Rolling walker (2 wheeled) Gait Pattern/deviations: Step-to pattern;Decreased step length - right;Decreased step length - left;Shuffle;Antalgic;Trunk flexed Gait velocity: decr   General Gait Details: cues for sequence, posture and position from RW; distance ltd by pain with pt tolerating min wt on R LE   Stairs            Wheelchair Mobility    Modified Rankin (Stroke Patients Only)       Balance                                    Cognition Arousal/Alertness: Awake/alert Behavior During Therapy: WFL for tasks assessed/performed Overall Cognitive Status: Within Functional Limits for  tasks assessed                      Exercises      General Comments        Pertinent Vitals/Pain Pain Assessment: 0-10 Pain Score: 7  Pain Location: R knee Pain Descriptors / Indicators: Aching;Sore Pain Intervention(s): Limited activity within patient's tolerance;Monitored during session;Premedicated before session    Home Living                      Prior Function            PT Goals (current goals can now be found in the care plan section) Acute Rehab PT Goals Patient Stated Goal: Resume previous active lifestyle with decreased pain PT Goal Formulation: With patient Time For Goal Achievement: 02/07/15 Potential to Achieve Goals: Good Progress towards PT goals: Progressing toward goals    Frequency  7X/week    PT Plan Current plan remains appropriate    Co-evaluation             End of Session Equipment Utilized During Treatment: Gait belt;Right knee immobilizer Activity Tolerance: Patient limited by pain Patient left: in bed;with call bell/phone within reach     Time: 8295-6213 PT Time Calculation (min) (ACUTE ONLY): 28 min  Charges:  $Gait Training: 8-22 mins $Therapeutic Activity: 8-22 mins  G Codes:      [name] 03-05-15, 4:24 PM

## 2015-02-03 NOTE — Progress Notes (Signed)
Patient ID: [name], female   DOB: 03-Apr-1933, 82 y.o.   MRN: [id number] Subjective: 1 Day Post-Op Procedure(s) (LRB): TOTAL RIGHT KNEE ARTHROPLASTY (Right) Patient reports Hoffman as mild.    Patient has complaints of R knee Hoffman.  We will start therapy today. Plan is to go to [facility] after hospital stay.  Objective: Vital signs in last 24 hours: Temp:  [97.8 F (36.6 C)-100.1 F (37.8 C)] 98.4 F (36.9 C) (01/27 0736) Pulse Rate:  [67-89] 85 (01/27 0736) Resp:  [12-25] 18 (01/27 0736) BP: (129-164)/(60-76) 149/66 mmHg (01/27 0736) SpO2:  [96 %-100 %] 100 % (01/27 0736)  Intake/Output from previous day:  Intake/Output Summary (Last 24 hours) at 02/03/15 0803 Last data filed at 02/03/15 0600  Gross per 24 hour  Intake 4278.33 ml  Output   1950 ml  Net 2328.33 ml    Intake/Output this shift:    Labs: Results for orders placed or performed during the hospital encounter of 02/02/15  CBC  Result Value Ref Range   WBC 9.7 4.0 - 10.5 K/uL   RBC 4.20 3.87 - 5.11 MIL/uL   Hemoglobin 12.4 12.0 - 15.0 g/dL   HCT 40.9 81.1 - 91.4 %   MCV 89.8 78.0 - 100.0 fL   MCH 29.5 26.0 - 34.0 pg   MCHC 32.9 30.0 - 36.0 g/dL   RDW 78.2 95.6 - 21.3 %   Platelets 211 150 - 400 K/uL  Basic metabolic panel  Result Value Ref Range   Sodium 134 (L) 135 - 145 mmol/L   Potassium 4.1 3.5 - 5.1 mmol/L   Chloride 101 101 - 111 mmol/L   CO2 26 22 - 32 mmol/L   Glucose, Bld 163 (H) 65 - 99 mg/dL   BUN 7 6 - 20 mg/dL   Creatinine, Ser 0.86 0.44 - 1.00 mg/dL   Calcium 8.7 (L) 8.9 - 10.3 mg/dL   GFR calc non Af Amer >60 >60 mL/min   GFR calc Af Amer >60 >60 mL/min   Anion gap 7 5 - 15    Exam - Neurologically intact ABD soft Neurovascular intact Sensation intact distally Intact pulses distally Dorsiflexion/Plantar flexion intact Incision: dressing C/D/I and no drainage No cellulitis present Compartment soft no calf Hoffman or sign of DVT Dressing - clean, dry, no drainage Motor  function intact - moving foot and toes well on exam.   Assessment/Plan: 1 Day Post-Op Procedure(s) (LRB): TOTAL RIGHT KNEE ARTHROPLASTY (Right)  Advance diet Up with therapy D/C IV fluids Past Medical History  Diagnosis Date  . 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 Arthritis   . Rosacea   . GERD (gastroesophageal reflux disease)     infrequently - otc med as needed  . Hypothyroidism     DVT Prophylaxis - Xarelto Protocol Weight-Bearing as tolerated to Right leg No vaccines. Plan D/C to [facility] when ready- possibly tomorrow depending on her progress with PT and Hoffman control Will discuss with Dr. Elissa Lovett, Dayna Barker. 02/03/2015, 8:03 AM

## 2015-02-03 NOTE — Discharge Instructions (Addendum)
Elevate leg above heart 6x a day for 20minutes each °Use knee immobilizer while walking until can SLR x 10 °Use knee immobilizer in bed to keep knee in extension °Aquacel dressing may remain in place until follow up. May shower with aquacel dressing in place. If the dressing becomes saturated or peels off, you may remove aquacel dressing. Do not remove steri-strips if they are present. Place new dressing with gauze and tape or ACE bandage which should be kept clean and dry and changed daily. ° °INSTRUCTIONS AFTER JOINT REPLACEMENT  ° °o Remove items at home which could result in a fall. This includes throw rugs or furniture in walking pathways °o ICE to the affected joint every three hours while awake for 30 minutes at a time, for at least the first 3-5 days, and then as needed for pain and swelling.  Continue to use ice for pain and swelling. You may notice swelling that will progress down to the foot and ankle.  This is normal after surgery.  Elevate your leg when you are not up walking on it.   °o Continue to use the breathing machine you got in the hospital (incentive spirometer) which will help keep your temperature down.  It is common for your temperature to cycle up and down following surgery, especially at night when you are not up moving around and exerting yourself.  The breathing machine keeps your lungs expanded and your temperature down. ° ° °DIET:  As you were doing prior to hospitalization, we recommend a well-balanced diet. ° °DRESSING / WOUND CARE / SHOWERING ° °Keep the surgical dressing until follow up.  The dressing is water proof, so you can shower without any extra covering.  IF THE DRESSING FALLS OFF or the wound gets wet inside, change the dressing with sterile gauze.  Please use good hand washing techniques before changing the dressing.  Do not use any lotions or creams on the incision until instructed by your surgeon.   ° °ACTIVITY ° °o Increase activity slowly as tolerated, but follow the  weight bearing instructions below.   °o No driving for 6 weeks or until further direction given by your physician.  You cannot drive while taking narcotics.  °o No lifting or carrying greater than 10 lbs. until further directed by your surgeon. °o Avoid periods of inactivity such as sitting longer than an hour when not asleep. This helps prevent blood clots.  °o You may return to work once you are authorized by your doctor.  ° ° ° °WEIGHT BEARING  ° °Weight bearing as tolerated with assist device (walker, cane, etc) as directed, use it as long as suggested by your surgeon or therapist, typically at least 4-6 weeks. ° ° °EXERCISES ° °Results after joint replacement surgery are often greatly improved when you follow the exercise, range of motion and muscle strengthening exercises prescribed by your doctor. Safety measures are also important to protect the joint from further injury. Any time any of these exercises cause you to have increased pain or swelling, decrease what you are doing until you are comfortable again and then slowly increase them. If you have problems or questions, call your caregiver or physical therapist for advice.  ° °Rehabilitation is important following a joint replacement. After just a few days of immobilization, the muscles of the leg can become weakened and shrink (atrophy).  These exercises are designed to build up the tone and strength of the thigh and leg muscles and to improve motion. Often   times heat used for twenty to thirty minutes before working out will loosen up your tissues and help with improving the range of motion but do not use heat for the first two weeks following surgery (sometimes heat can increase post-operative swelling).  ° °These exercises can be done on a training (exercise) mat, on the floor, on a table or on a bed. Use whatever works the best and is most comfortable for you.    Use music or television while you are exercising so that the exercises are a pleasant  break in your day. This will make your life better with the exercises acting as a break in your routine that you can look forward to.   Perform all exercises about fifteen times, three times per day or as directed.  You should exercise both the operative leg and the other leg as well. ° °Exercises include: °  °• Quad Sets - Tighten up the muscle on the front of the thigh (Quad) and hold for 5-10 seconds.   °• Straight Leg Raises - With your knee straight (if you were given a brace, keep it on), lift the leg to 60 degrees, hold for 3 seconds, and slowly lower the leg.  Perform this exercise against resistance later as your leg gets stronger.  °• Leg Slides: Lying on your back, slowly slide your foot toward your buttocks, bending your knee up off the floor (only go as far as is comfortable). Then slowly slide your foot back down until your leg is flat on the floor again.  °• Angel Wings: Lying on your back spread your legs to the side as far apart as you can without causing discomfort.  °• Hamstring Strength:  Lying on your back, push your heel against the floor with your leg straight by tightening up the muscles of your buttocks.  Repeat, but this time bend your knee to a comfortable angle, and push your heel against the floor.  You may put a pillow under the heel to make it more comfortable if necessary.  ° °A rehabilitation program following joint replacement surgery can speed recovery and prevent re-injury in the future due to weakened muscles. Contact your doctor or a physical therapist for more information on knee rehabilitation.  ° ° °CONSTIPATION ° °Constipation is defined medically as fewer than three stools per week and severe constipation as less than one stool per week.  Even if you have a regular bowel pattern at home, your normal regimen is likely to be disrupted due to multiple reasons following surgery.  Combination of anesthesia, postoperative narcotics, change in appetite and fluid intake all can  affect your bowels.  ° °YOU MUST use at least one of the following options; they are listed in order of increasing strength to get the job done.  They are all available over the counter, and you may need to use some, POSSIBLY even all of these options:   ° °Drink plenty of fluids (prune juice may be helpful) and high fiber foods °Colace 100 mg by mouth twice a day  °Senokot for constipation as directed and as needed Dulcolax (bisacodyl), take with full glass of water  °Miralax (polyethylene glycol) once or twice a day as needed. ° °If you have tried all these things and are unable to have a bowel movement in the first 3-4 days after surgery call either your surgeon or your primary doctor.   ° °If you experience loose stools or diarrhea, hold the medications until you stool   forms back up.  If your symptoms do not get better within 1 week or if they get worse, check with your doctor.  If you experience "the worst abdominal pain ever" or develop nausea or vomiting, please contact the office immediately for further recommendations for treatment.   ITCHING:  If you experience itching with your medications, try taking only a single pain pill, or even half a pain pill at a time.  You can also use Benadryl over the counter for itching or also to help with sleep.   TED HOSE STOCKINGS:  Use stockings on both legs until for at least 2 weeks or as directed by physician office. They may be removed at night for sleeping.  MEDICATIONS:  See your medication summary on the After Visit Summary that nursing will review with you.  You may have some home medications which will be placed on hold until you complete the course of blood thinner medication.  It is important for you to complete the blood thinner medication as prescribed.  PRECAUTIONS:  If you experience chest pain or shortness of breath - call 911 immediately for transfer to the hospital emergency department.   If you develop a fever greater that 101 F, purulent  drainage from wound, increased redness or drainage from wound, foul odor from the wound/dressing, or calf pain - CONTACT YOUR SURGEON.                                                   FOLLOW-UP APPOINTMENTS:  If you do not already have a post-op appointment, please call the office for an appointment to be seen by your surgeon.  Guidelines for how soon to be seen are listed in your After Visit Summary, but are typically between 1-4 weeks after surgery.  OTHER INSTRUCTIONS:   Knee Replacement:  Do not place pillow under knee, focus on keeping the knee straight while resting. CPM instructions: 0-90 degrees, 2 hours in the morning, 2 hours in the afternoon, and 2 hours in the evening. Place foam block, curve side up under heel at all times except when in CPM or when walking.  DO NOT modify, tear, cut, or change the foam block in any way.  MAKE SURE YOU:   Understand these instructions.   Get help right away if you are not doing well or get worse.    Thank you for letting us be a part of your medical care team.  It is a privilege we respect greatly.  We hope these instructions will help you stay on track for a fast and full recovery!   =====================================================================================================================   Information on my medicine - XARELTO (Rivaroxaban)  This medication education was reviewed with me or my healthcare representative as part of my discharge preparation.  The pharmacist that spoke with me during my hospital stay was:  Kapri Nero, Ky Barban, RPH  Why was Xarelto prescribed for you? Xarelto was prescribed for you to reduce the risk of blood clots forming after orthopedic surgery. The medical term for these abnormal blood clots is venous thromboembolism (VTE).  What do you need to know about xarelto ? Take your Xarelto ONCE DAILY at the same time every day. You may take it either with or without food.  If you have difficulty  swallowing the tablet whole, you may crush it and mix in applesauce  just prior to taking your dose.  Take Xarelto exactly as prescribed by your doctor and DO NOT stop taking Xarelto without talking to the doctor who prescribed the medication.  Stopping without other VTE prevention medication to take the place of Xarelto may increase your risk of developing a clot.  After discharge, you should have regular check-up appointments with your healthcare provider that is prescribing your Xarelto.    What do you do if you miss a dose? If you miss a dose, take it as soon as you remember on the same day then continue your regularly scheduled once daily regimen the next day. Do not take two doses of Xarelto on the same day.   Important Safety Information A possible side effect of Xarelto is bleeding. You should call your healthcare provider right away if you experience any of the following: ? Bleeding from an injury or your nose that does not stop. ? Unusual colored urine (red or dark brown) or unusual colored stools (red or black). ? Unusual bruising for unknown reasons. ? A serious fall or if you hit your head (even if there is no bleeding).  Some medicines may interact with Xarelto and might increase your risk of bleeding while on Xarelto. To help avoid this, consult your healthcare provider or pharmacist prior to using any new prescription or non-prescription medications, including herbals, vitamins, non-steroidal anti-inflammatory drugs (NSAIDs) and supplements.  This website has more information on Xarelto: [email or web address]

## 2015-02-03 NOTE — Evaluation (Signed)
Occupational Therapy Evaluation Patient Details Name: [name] MRN: [id number] DOB: 1933/04/17 Today's Date: 02/03/2015    History of Present Illness R TKR   Clinical Impression   This 82 year old female was admitted for the above surgery.  She will benefit from skilled OT to increase safety and independence with adls. Goals in acute are min guard to min A for selected ADLs.  Pt will benefit from continued OT at SNF to restore independence    Follow Up Recommendations  SNF    Equipment Recommendations   (defer to next venue:  possibly 3:1)    Recommendations for Other Services       Precautions / Restrictions Precautions Precautions: Knee;Fall Required Braces or Orthoses: Knee Immobilizer - Right Knee Immobilizer - Right: Discontinue once straight leg raise with < 10 degree lag Restrictions Weight Bearing Restrictions: No Other Position/Activity Restrictions: WBAT      Mobility Bed Mobility   Bed Mobility: Supine to Sit     Supine to sit: Mod assist     General bed mobility comments: cues for sequence. Pt had difficulty scooting forward; utilized pad  Transfers   Equipment used: Rolling walker (2 wheeled) Transfers: Sit to/from Stand Sit to Stand: Min assist;From elevated surface         General transfer comment: cues for UE/LE placement    Balance                                            ADL Overall ADL's : Needs assistance/impaired     Grooming: Set up;Sitting   Upper Body Bathing: Set up;Sitting   Lower Body Bathing: Moderate assistance;Sit to/from stand   Upper Body Dressing : Minimal assistance;Sitting (iv)   Lower Body Dressing: Maximal assistance;Sit to/from stand   Toilet Transfer: Minimal assistance;Stand-pivot;RW (to chair; pt has catheter)   Toileting- Clothing Manipulation and Hygiene: Minimal assistance;Sit to/from stand (pt with catheter)         General ADL Comments: sat EOB; pt "woozy" and  nauseous/vomited 1x.  BP 171/72.  Able to get up to chair and finish bathing task.  Educated on reacher, but did not use.  Cues for placing weight on RLE during transfer and for sequence  Pt was rushing to get to chair--cued to slow down for safety     Vision     Perception     Praxis      Pertinent Vitals/Pain Pain Assessment: No/denies pain Pain Score: 5  Pain Location: R knee Pain Descriptors / Indicators: Aching Pain Intervention(s): Limited activity within patient's tolerance;Monitored during session;Premedicated before session;Repositioned;Ice applied     Hand Dominance     Extremity/Trunk Assessment Upper Extremity Assessment Upper Extremity Assessment: Overall WFL for tasks assessed           Communication Communication Communication: No difficulties   Cognition Arousal/Alertness: Awake/alert Behavior During Therapy: WFL for tasks assessed/performed Overall Cognitive Status: Within Functional Limits for tasks assessed--cues for safety during transfer                     General Comments       Exercises       Shoulder Instructions      Home Living Family/patient expects to be discharged to:: Skilled nursing facility Living Arrangements: Alone  Prior Functioning/Environment Level of Independence: Independent             OT Diagnosis: Generalized weakness;Acute pain   OT Problem List: Decreased strength;Decreased activity tolerance;Decreased knowledge of use of DME or AE;Pain;Decreased knowledge of precautions   OT Treatment/Interventions: Self-care/ADL training;DME and/or AE instruction;Patient/family education    OT Goals(Current goals can be found in the care plan section) Acute Rehab OT Goals Patient Stated Goal: Resume previous active lifestyle with decreased pain OT Goal Formulation: With patient Time For Goal Achievement: 02/10/15 Potential to Achieve Goals: Good ADL Goals Pt  Will Perform Grooming: with min guard assist;standing Pt Will Perform Lower Body Bathing: with min assist;sit to/from stand;with adaptive equipment Pt Will Transfer to Toilet: with min guard assist;ambulating;bedside commode with no safety cues Pt Will Perform Toileting - Clothing Manipulation and hygiene: with min guard assist;sit to/from stand  OT Frequency: Min 2X/week   Barriers to D/C:            Co-evaluation              End of Session Equipment Utilized During Treatment: Gait belt CPM Right Knee CPM Right Knee: Off Nurse Communication:  (BP/nausea, which resolved)  Activity Tolerance: Patient tolerated treatment well Patient left: with call bell/phone within reach;in chair   Time: 0922-0953 OT Time Calculation (min): 31 min Charges:  OT General Charges $OT Visit: 1 Procedure OT Evaluation $OT Eval Low Complexity: 1 Procedure OT Treatments $Self Care/Home Management : 8-22 mins G-Codes:    Shanard Treto 20-Feb-2015, 10:20 AM  Marica Otter, OTR/L [phone] Feb 20, 2015

## 2015-02-03 NOTE — Discharge Summary (Signed)
Physician Discharge Summary   Patient ID: [name] MRN: [id number] DOB/AGE: 1933/02/21 82 y.o.  Admit date: 02/02/2015 Discharge date: anticipated 02/04/15  Primary Diagnosis: right knee primary osteoarthritis  Admission Diagnoses:  Past Medical History  Diagnosis Date  . 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 Arthritis   . Rosacea   . GERD (gastroesophageal reflux disease)     infrequently - otc med as needed  . Hypothyroidism    Discharge Diagnoses:   Principal Problem:   Primary osteoarthritis of right knee Active Problems:   Right knee DJD  Estimated body mass index is 30.73 kg/(m^2) as calculated from the following:   Height as of this encounter: _0  (1.473 m).   Weight as of this encounter: 66.679 kg (147 lb).  Procedure:  Procedure(s) (LRB): TOTAL RIGHT KNEE ARTHROPLASTY (Right)   Consults: None  HPI: see H&P Laboratory Data: Admission on 02/02/2015  Component Date Value Ref Range Status  . WBC 02/03/2015 9.7  4.0 - 10.5 K/uL Final  . RBC 02/03/2015 4.20  3.87 - 5.11 MIL/uL Final  . Hemoglobin 02/03/2015 12.4  12.0 - 15.0 g/dL Final  . HCT 02/03/2015 37.7  36.0 - 46.0 % Final  . MCV 02/03/2015 89.8  78.0 - 100.0 fL Final  . MCH 02/03/2015 29.5  26.0 - 34.0 pg Final  . MCHC 02/03/2015 32.9  30.0 - 36.0 g/dL Final  . RDW 02/03/2015 14.3  11.5 - 15.5 % Final  . Platelets 02/03/2015 211  150 - 400 K/uL Final  . Sodium 02/03/2015 134* 135 - 145 mmol/L Final  . Potassium 02/03/2015 4.1  3.5 - 5.1 mmol/L Final  . Chloride 02/03/2015 101  101 - 111 mmol/L Final  . CO2 02/03/2015 26  22 - 32 mmol/L Final  . Glucose, Bld 02/03/2015 163* 65 - 99 mg/dL Final  . BUN 02/03/2015 7  6 - 20 mg/dL Final  . Creatinine, Ser 02/03/2015 0.59  0.44 - 1.00 mg/dL Final  . Calcium 02/03/2015 8.7* 8.9 - 10.3 mg/dL Final  . GFR calc non Af Amer 02/03/2015 >60  >60 mL/min Final  . GFR calc Af  Amer 02/03/2015 >60  >60 mL/min Final   Comment: (NOTE) The eGFR has been calculated using the CKD EPI equation. This calculation has not been validated in all clinical situations. eGFR's persistently <60 mL/min signify possible Chronic Kidney Disease.   Georgiann Hahn gap 02/03/2015 7  5 - 15 Final  Hospital Outpatient Visit on 01/24/2015  Component Date Value Ref Range Status  . ABO/RH(D) 01/24/2015 A POS   Final  Hospital Outpatient Visit on 01/24/2015  Component Date Value Ref Range Status  . aPTT 01/24/2015 27  24 - 37 seconds Final  . Sodium 01/24/2015 143  135 - 145 mmol/L Final  . Potassium 01/24/2015 4.3  3.5 - 5.1 mmol/L Final  . Chloride 01/24/2015 108  101 - 111 mmol/L Final  . CO2 01/24/2015 26  22 - 32 mmol/L Final  . Glucose, Bld 01/24/2015 140* 65 - 99 mg/dL Final  . BUN 01/24/2015 12  6 - 20 mg/dL Final  . Creatinine, Ser 01/24/2015 0.72  0.44 - 1.00 mg/dL Final  . Calcium 01/24/2015 9.4  8.9 - 10.3 mg/dL Final  . GFR calc non Af Amer 01/24/2015 >60  >60 mL/min Final  . GFR calc Af Amer 01/24/2015 >60  >60 mL/min Final   Comment: (  NOTE) The eGFR has been calculated using the CKD EPI equation. This calculation has not been validated in all clinical situations. eGFR's persistently <60 mL/min signify possible Chronic Kidney Disease.   . Anion gap 01/24/2015 9  5 - 15 Final  . WBC 01/24/2015 6.5  4.0 - 10.5 K/uL Final  . RBC 01/24/2015 4.61  3.87 - 5.11 MIL/uL Final  . Hemoglobin 01/24/2015 13.5  12.0 - 15.0 g/dL Final  . HCT 01/24/2015 41.5  36.0 - 46.0 % Final  . MCV 01/24/2015 90.0  78.0 - 100.0 fL Final  . MCH 01/24/2015 29.3  26.0 - 34.0 pg Final  . MCHC 01/24/2015 32.5  30.0 - 36.0 g/dL Final  . RDW 01/24/2015 14.2  11.5 - 15.5 % Final  . Platelets 01/24/2015 202  150 - 400 K/uL Final  . Prothrombin Time 01/24/2015 13.9  11.6 - 15.2 seconds Final  . INR 01/24/2015 1.05  0.00 - 1.49 Final  . ABO/RH(D) 01/24/2015 A POS   Final  . Antibody Screen 01/24/2015 NEG    Final  . Sample Expiration 01/24/2015 02/05/2015   Final  . Extend sample reason 01/24/2015 NO TRANSFUSIONS OR PREGNANCY IN THE PAST 3 MONTHS   Final  . Color, Urine 01/24/2015 YELLOW  YELLOW Final  . APPearance 01/24/2015 CLEAR  CLEAR Final  . Specific Gravity, Urine 01/24/2015 1.003* 1.005 - 1.030 Final  . pH 01/24/2015 6.0  5.0 - 8.0 Final  . Glucose, UA 01/24/2015 NEGATIVE  NEGATIVE mg/dL Final  . Hgb urine dipstick 01/24/2015 NEGATIVE  NEGATIVE Final  . Bilirubin Urine 01/24/2015 NEGATIVE  NEGATIVE Final  . Ketones, ur 01/24/2015 NEGATIVE  NEGATIVE mg/dL Final  . Protein, ur 01/24/2015 NEGATIVE  NEGATIVE mg/dL Final  . Nitrite 01/24/2015 NEGATIVE  NEGATIVE Final  . Leukocytes, UA 01/24/2015 TRACE* NEGATIVE Final  . MRSA, PCR 01/24/2015 NEGATIVE  NEGATIVE Final  . Staphylococcus aureus 01/24/2015 NEGATIVE  NEGATIVE Final   Comment:        The Xpert SA Assay (FDA approved for NASAL specimens in patients over 59 years of age), is one component of a comprehensive surveillance program.  Test performance has been validated by [facility] for patients greater than or equal to 2 year old. It is not intended to diagnose infection nor to guide or monitor treatment.   . Squamous Epithelial / LPF 01/24/2015 0-5* NONE SEEN Final  . WBC, UA 01/24/2015 0-5  0 - 5 WBC/hpf Final  . RBC / HPF 01/24/2015 0-5  0 - 5 RBC/hpf Final  . Bacteria, UA 01/24/2015 RARE* NONE SEEN Final     X-Rays:Dg Knee 1-2 Views Right  01/24/2015  CLINICAL DATA:  Total knee replacement. EXAM: RIGHT KNEE - 1-2 VIEW COMPARISON:  MRI 10/01/2014. FINDINGS: Prominent tricompartment degenerative change. No acute bony abnormality. Small knee joint effusion cannot be excluded . Peripheral vascular calcification IMPRESSION: 1. Prominent tricompartment degenerative change. Small knee joint effusion cannot be excluded. No acute bony abnormality. 2.  Peripheral vascular disease. Electronically Signed   By: Marcello Moores  Register   On:  01/24/2015 12:57   Dg Knee Right Port  02/02/2015  CLINICAL DATA:  Status post right total knee replacement. EXAM: PORTABLE RIGHT KNEE - 1-2 VIEW COMPARISON:  01/24/2015 FINDINGS: A right total knee prosthesis is in place with expected alignment and appearance. No periprosthetic fracture or other complicating feature is observed. Expected gas in the joint and soft tissues. Expected positioning of a polymer component. There appears to be a tube projecting over the  proximal tibial and fibular shafts. At first glance this resembles the fibula but the wall thickness is too regular for this to be bone. IMPRESSION: 1. Total knee prosthesis in place without periprosthetic fracture or other complicating feature. Electronically Signed   By: Van Clines M.D.   On: 02/02/2015 10:56    EKG: Orders placed or performed during the hospital encounter of 01/24/15  . EKG 12-Lead  . EKG 12-Lead     Hospital Course: [name] is a 82 y.o. who was admitted to [facility]. They were brought to the operating room on 02/02/2015 and underwent Procedure(s): TOTAL RIGHT KNEE ARTHROPLASTY.  Patient tolerated the procedure well and was later transferred to the recovery room and then to the orthopaedic floor for postoperative care.  They were given PO and IV analgesics for pain control following their surgery.  They were given 24 hours of postoperative antibiotics of  Anti-infectives    Start     Dose/Rate Route Frequency Ordered Stop   02/02/15 1400  ceFAZolin (ANCEF) IVPB 2 g/50 mL premix     2 g 100 mL/hr over 30 Minutes Intravenous Every 6 hours 02/02/15 1150 02/03/15 0151   02/02/15 0802  polymyxin B 500,000 Units, bacitracin 50,000 Units in sodium chloride irrigation 0.9 % 500 mL irrigation  Status:  Discontinued       As needed 02/02/15 0802 02/02/15 1000   02/02/15 0600  ceFAZolin (ANCEF) IVPB 2 g/50 mL premix     2 g 100 mL/hr over 30 Minutes Intravenous On call to O.R. 02/02/15 0530  02/02/15 0730     and started on DVT prophylaxis in the form of Xarelto, TED hose and SCDs.   PT and OT were ordered for total joint protocol.  Discharge planning consulted to help with postop disposition and equipment needs.  Patient had a good night on the evening of surgery.  They started to get up OOB with therapy on day one. Continued to work with therapy into day two. By day two, anticipate that the patient will have progressed with therapy and will be meeting their goals.  Incision was healing well.  Patient was seen in rounds and was ready for D/C.   Diet: Regular diet Activity:WBAT Follow-up:in 10-14 days Disposition - [facility] Discharged Condition: good      Medication List    STOP taking these medications        aspirin EC 81 MG tablet     BIOTIN PO     CALCIUM-MAGNESIUM-ZINC PO     GLUCOSAMINE CHONDROITIN PLUS PO     Magnesium 250 MG Tabs      TAKE these medications        docusate sodium 100 MG capsule  Commonly known as:  COLACE  Take 1 capsule (100 mg total) by mouth 2 (two) times daily as needed for mild constipation.     levothyroxine 88 MCG tablet  Commonly known as:  SYNTHROID, LEVOTHROID  Take 88 mcg by mouth daily before breakfast.     oxyCODONE-acetaminophen 5-325 MG tablet  Commonly known as:  PERCOCET  Take 1 tablet by mouth every 4 (four) hours as needed.     rivaroxaban 10 MG Tabs tablet  Commonly known as:  XARELTO  Take 1 tablet (10 mg total) by mouth daily.     SYSTANE OP  Apply 1 drop to eye 2 (two) times daily.     VITAMIN B6 PO  Take 1 tablet by  mouth daily.           Follow-up Information    Follow up with BEANE,JEFFREY C, MD In 2 weeks.   Specialty:  Orthopedic Surgery   Why:  For suture removal   Contact information:   [address] [place] [address] [phone]       Follow up with [facility] .   Specialty:  Skilled Nursing Facility   Contact  information:   [place] [phone]      Signed: Lacie Draft, PA-C Orthopaedic Surgery 02/03/2015, 8:44 AM

## 2015-02-03 NOTE — Progress Notes (Addendum)
CSW assisting with d/c planning. [facility] is able to accept pt on SAT if stable for d/c. CSW  with d/c planning to SNF. [employer] has provided authorization for SNF placement. Weekend CSW will assist with d/c planning to [place].  Cori Razor LCSW [phone]

## 2015-02-03 NOTE — Care Management Note (Signed)
Case Management Note  Patient Details  Name: Jasmine BASTYRker MRN: [id number] Date of Birth: Oct 20, 1933  Subjective/Objective:  S/p Right total knee arthroplasty                   Action/Plan: Discharge planning per CSW  Expected Discharge Date:  02/04/15               Expected Discharge Plan:  Skilled Nursing Facility  In-House Referral:  Clinical Social Work  Discharge planning Services  CM Consult  Post Acute Care Choice:  NA Choice offered to:  NA  DME Arranged:  N/A DME Agency:  NA  HH Arranged:  NA HH Agency:  NA  Status of Service:  Completed, signed off  Medicare Important Message Given:    Date Medicare IM Given:    Medicare IM give by:    Date Additional Medicare IM Given:    Additional Medicare Important Message give by:     If discussed at Long Length of Stay Meetings, dates discussed:    Additional Comments:  Alexis Goodell, RN 02/03/2015, 10:53 AM [phone]

## 2015-02-04 DIAGNOSIS — K219 Gastro-esophageal reflux disease without esophagitis: Secondary | ICD-10-CM | POA: Diagnosis not present

## 2015-02-04 DIAGNOSIS — M25561 Pain in right knee: Secondary | ICD-10-CM | POA: Diagnosis not present

## 2015-02-04 DIAGNOSIS — Z96651 Presence of right artificial knee joint: Secondary | ICD-10-CM | POA: Diagnosis not present

## 2015-02-04 DIAGNOSIS — Z471 Aftercare following joint replacement surgery: Secondary | ICD-10-CM | POA: Diagnosis not present

## 2015-02-04 DIAGNOSIS — R262 Difficulty in walking, not elsewhere classified: Secondary | ICD-10-CM | POA: Diagnosis not present

## 2015-02-04 DIAGNOSIS — E039 Hypothyroidism, unspecified: Secondary | ICD-10-CM | POA: Diagnosis not present

## 2015-02-04 LAB — CBC
HEMATOCRIT: 33.9 % — AB (ref 36.0–46.0)
HEMOGLOBIN: 11.4 g/dL — AB (ref 12.0–15.0)
MCH: 29.4 pg (ref 26.0–34.0)
MCHC: 33.6 g/dL (ref 30.0–36.0)
MCV: 87.4 fL (ref 78.0–100.0)
Platelets: 206 10*3/uL (ref 150–400)
RBC: 3.88 MIL/uL (ref 3.87–5.11)
RDW: 14.2 % (ref 11.5–15.5)
WBC: 13.2 10*3/uL — ABNORMAL HIGH (ref 4.0–10.5)

## 2015-02-04 NOTE — Progress Notes (Signed)
Report given to [place], Health and safety inspector, at [facility]. Advised that pt transferring via car

## 2015-02-04 NOTE — Clinical Social Work Placement (Signed)
   CLINICAL SOCIAL WORK PLACEMENT  NOTE  Date:  02/04/2015  Patient Details  Name: [name] MRN: [id number] Date of Birth: 12-23-1933  Clinical Social Work is seeking post-discharge placement for this patient at the Skilled  Nursing Facility level of care (*CSW will initial, date and re-position this form in  chart as items are completed):  No   Patient/family provided with [facility] Clinical Social Work Department's list of facilities offering this level of care within the geographic area requested by the patient (or if unable, by the patient's family).  Yes   Patient/family informed of their freedom to choose among providers that offer the needed level of care, that participate in Medicare, Medicaid or managed care program needed by the patient, have an available bed and are willing to accept the patient.  Yes   Patient/family informed of [name]'s ownership interest in [facility] and [facility], as well as of the fact that they are under no obligation to receive care at these facilities.  PASRR submitted to EDS on 01/26/15     PASRR number received on 02/02/15     Existing PASRR number confirmed on       FL2 transmitted to all facilities in geographic area requested by pt/family on 02/02/15     FL2 transmitted to all facilities within larger geographic area on       Patient informed that his/her managed care company has contracts with or will negotiate with certain facilities, including the following:        Yes   Patient/family informed of bed offers received.  Patient chooses bed at [facility]     Physician recommends and patient chooses bed at      Patient to be transferred to [facility] on 02/04/15.  Patient to be transferred to facility by CAR     Patient family notified on 02/04/15 of transfer.  Name of family member notified:  DAUGHTER     PHYSICIAN       Additional Comment: Pt / family are in agreement with d/c to [facility]  today. PT has approved transport by car. D/C Summary has been sent to SNF for review. Scripts, avs been included in d/c packet. D/C packet as been provided to pt.   _______________________________________________ Royetta Asal, LCSW  [phone] 02/04/2015, 1:08 PM

## 2015-02-04 NOTE — Progress Notes (Signed)
   Subjective: 2 Days Post-Op Procedure(s) (LRB): TOTAL RIGHT KNEE ARTHROPLASTY (Right)  Pain has improved today Able to use restroom much easier Denies any new symptoms or issues Patient reports pain as moderate.  Objective:   VITALS:   Filed Vitals:   02/03/15 2341 02/04/15 0418  BP:  141/59  Pulse:  95  Temp: 101 F (38.3 C) 99.7 F (37.6 C)  Resp:  16    Right knee incision healing well nv intact distally Ace removed' Mild edema  LABS  Recent Labs  02/03/15 0500 02/04/15 0510  HGB 12.4 11.4*  HCT 37.7 33.9*  WBC 9.7 13.2*  PLT 211 206     Recent Labs  02/03/15 0500  NA 134*  K 4.1  BUN 7  CREATININE 0.59  GLUCOSE 163*     Assessment/Plan: 2 Days Post-Op Procedure(s) (LRB): TOTAL RIGHT KNEE ARTHROPLASTY (Right) Plan for therapy today then rehab placement Bed available at [facility] place F/u in 2 weeks with Dr. Shelle Iron Pain management Pulmonary toilet    Alphonsa Overall, MPAS, PA-C  02/04/2015, 7:44 AM

## 2015-02-04 NOTE — Progress Notes (Signed)
Physical Therapy Treatment Patient Details Name: [name] MRN: [id number] DOB: 08-20-1933 Today's Date: 02/04/2015    History of Present Illness R TKR    PT Comments    Pt with marked improvement in activity tolerance with better pain control.    Follow Up Recommendations  SNF     Equipment Recommendations  None recommended by PT    Recommendations for Other Services OT consult     Precautions / Restrictions Precautions Precautions: Knee;Fall Required Braces or Orthoses: Knee Immobilizer - Right Knee Immobilizer - Right: Discontinue once straight leg raise with < 10 degree lag Restrictions Weight Bearing Restrictions: No Other Position/Activity Restrictions: WBAT    Mobility  Bed Mobility Overal bed mobility: Needs Assistance Bed Mobility: Supine to Sit     Supine to sit: Min assist     General bed mobility comments: cues for sequence and use of L LE to self assist  Transfers Overall transfer level: Needs assistance Equipment used: Rolling walker (2 wheeled) Transfers: Sit to/from Stand Sit to Stand: Min assist Stand pivot transfers: Min assist       General transfer comment: Cues for LE management and use of UEs to self assist.  Stand/pvt with RW bed to [facility]  Ambulation/Gait Ambulation/Gait assistance: Min assist Ambulation Distance (Feet): 75 Feet Assistive device: Rolling walker (2 wheeled) Gait Pattern/deviations: Step-to pattern;Decreased step length - right;Decreased step length - left;Shuffle;Trunk flexed Gait velocity: decr   General Gait Details: cues for sequence, posture and position from RW; distance ltd by pain with pt tolerating min wt on R LE   Stairs            Wheelchair Mobility    Modified Rankin (Stroke Patients Only)       Balance                                    Cognition Arousal/Alertness: Awake/alert Behavior During Therapy: WFL for tasks assessed/performed Overall Cognitive Status:  Within Functional Limits for tasks assessed                      Exercises Total Joint Exercises Ankle Circles/Pumps: AROM;Both;15 reps;Supine Quad Sets: AROM;Both;Supine;15 reps Heel Slides: AAROM;Right;Supine;15 reps Straight Leg Raises: AAROM;Right;Supine;15 reps    General Comments        Pertinent Vitals/Pain Pain Assessment: 0-10 Pain Score: 3  Pain Location: R knee Pain Descriptors / Indicators: Aching;Sore Pain Intervention(s): Limited activity within patient's tolerance;Monitored during session;Premedicated before session;Ice applied    Home Living                      Prior Function            PT Goals (current goals can now be found in the care plan section) Acute Rehab PT Goals Patient Stated Goal: Resume previous active lifestyle with decreased pain PT Goal Formulation: With patient Time For Goal Achievement: 02/07/15 Potential to Achieve Goals: Good Progress towards PT goals: Progressing toward goals    Frequency  7X/week    PT Plan Current plan remains appropriate    Co-evaluation             End of Session Equipment Utilized During Treatment: Gait belt;Right knee immobilizer Activity Tolerance: Patient tolerated treatment well Patient left: in chair;with call bell/phone within reach;with family/visitor present     Time: 1478-2956 PT Time Calculation (min) (ACUTE ONLY): 41 min  Charges:  $Gait Training: 8-22 mins $Therapeutic Exercise: 8-22 mins $Therapeutic Activity: 8-22 mins                    G Codes:      Vlada Uriostegui 2015-02-15, 11:24 AM

## 2015-02-04 NOTE — Progress Notes (Signed)
Pt escorted to lobby via Nursing Student; Pt being xferred to [facility] via car; driven to facility via family member

## 2015-02-04 NOTE — Discharge Summary (Signed)
Physician Discharge Summary   Patient ID: [name] MRN: [id number] DOB/AGE: [birth year] 82 y.o.  Admit date: 02/02/2015 Discharge date: 02/04/2015  Admission Diagnoses:  Principal Problem:   Primary osteoarthritis of right knee Active Problems:   Right knee DJD   Discharge Diagnoses:  Same   Surgeries: Procedure(s): TOTAL RIGHT KNEE ARTHROPLASTY on 02/02/2015   Consultants: PT/OT  Discharged Condition: Stable  Hospital Course: [name] is an 82 y.o. female who was admitted 02/02/2015 with a chief complaint of No chief complaint on file. , and found to have a diagnosis of Primary osteoarthritis of right knee.  They were brought to the operating room on 02/02/2015 and underwent the above named procedures.    The patient had an uncomplicated hospital course and was stable for discharge.  Recent vital signs:  Filed Vitals:   02/03/15 2341 02/04/15 0418  BP:  141/59  Pulse:  95  Temp: 101 F (38.3 C) 99.7 F (37.6 C)  Resp:  16    Recent laboratory studies:  Results for orders placed or performed during the hospital encounter of 02/02/15  CBC  Result Value Ref Range   WBC 9.7 4.0 - 10.5 K/uL   RBC 4.20 3.87 - 5.11 MIL/uL   Hemoglobin 12.4 12.0 - 15.0 g/dL   HCT 78.2 95.6 - 21.3 %   MCV 89.8 78.0 - 100.0 fL   MCH 29.5 26.0 - 34.0 pg   MCHC 32.9 30.0 - 36.0 g/dL   RDW 08.6 57.8 - 46.9 %   Platelets 211 150 - 400 K/uL  Basic metabolic panel  Result Value Ref Range   Sodium 134 (L) 135 - 145 mmol/L   Potassium 4.1 3.5 - 5.1 mmol/L   Chloride 101 101 - 111 mmol/L   CO2 26 22 - 32 mmol/L   Glucose, Bld 163 (H) 65 - 99 mg/dL   BUN 7 6 - 20 mg/dL   Creatinine, Ser 6.29 0.44 - 1.00 mg/dL   Calcium 8.7 (L) 8.9 - 10.3 mg/dL   GFR calc non Af Amer >60 >60 mL/min   GFR calc Af Amer >60 >60 mL/min   Anion gap 7 5 - 15  CBC  Result Value Ref Range   WBC 13.2 (H) 4.0 - 10.5 K/uL   RBC 3.88 3.87 - 5.11 MIL/uL   Hemoglobin 11.4 (L) 12.0 - 15.0 g/dL   HCT 52.8 (L) 41.3 - 24.4 %   MCV 87.4 78.0 - 100.0 fL   MCH 29.4 26.0 - 34.0 pg   MCHC 33.6 30.0 - 36.0 g/dL   RDW 01.0 27.2 - 53.6 %   Platelets 206 150 - 400 K/uL    Discharge Medications:     Medication List    STOP taking these medications        aspirin EC 81 MG tablet     BIOTIN PO     CALCIUM-MAGNESIUM-ZINC PO     GLUCOSAMINE CHONDROITIN PLUS PO     Magnesium 250 MG Tabs      TAKE these medications        docusate sodium 100 MG capsule  Commonly known as:  COLACE  Take 1 capsule (100 mg total) by mouth 2 (two) times daily as needed for mild constipation.     levothyroxine 88 MCG tablet  Commonly known as:  SYNTHROID, LEVOTHROID  Take 88 mcg by mouth daily before breakfast.     oxyCODONE-acetaminophen 5-325 MG tablet  Commonly known as:  PERCOCET  Take 1 tablet  by mouth every 4 (four) hours as needed.     rivaroxaban 10 MG Tabs tablet  Commonly known as:  XARELTO  Take 1 tablet (10 mg total) by mouth daily.     SYSTANE OP  Apply 1 drop to eye 2 (two) times daily.     VITAMIN B6 PO  Take 1 tablet by mouth daily.        Diagnostic Studies: Dg Knee 1-2 Views Right  01/24/2015  CLINICAL DATA:  Total knee replacement. EXAM: RIGHT KNEE - 1-2 VIEW COMPARISON:  MRI 10/01/2014. FINDINGS: Prominent tricompartment degenerative change. No acute bony abnormality. Small knee joint effusion cannot be excluded . Peripheral vascular calcification IMPRESSION: 1. Prominent tricompartment degenerative change. Small knee joint effusion cannot be excluded. No acute bony abnormality. 2.  Peripheral vascular disease. Electronically Signed   By: Maisie Fus  Register   On: 01/24/2015 12:57   Dg Knee Right Port  02/02/2015  CLINICAL DATA:  Status post right total knee replacement. EXAM: PORTABLE RIGHT KNEE - 1-2 VIEW COMPARISON:  01/24/2015 FINDINGS: A right total knee prosthesis is in place with expected alignment and appearance. No periprosthetic fracture or other complicating  feature is observed. Expected gas in the joint and soft tissues. Expected positioning of a polymer component. There appears to be a tube projecting over the proximal tibial and fibular shafts. At first glance this resembles the fibula but the wall thickness is too regular for this to be bone. IMPRESSION: 1. Total knee prosthesis in place without periprosthetic fracture or other complicating feature. Electronically Signed   By: Gaylyn Rong M.D.   On: 02/02/2015 10:56    Disposition: Final discharge disposition not confirmed        Follow-up Information    Follow up with BEANE,JEFFREY C, MD In 2 weeks.   Specialty:  Orthopedic Surgery   Why:  For suture removal   Contact information:   [address] [place] [address] [phone]       Follow up with [facility] .   Specialty:  Skilled Nursing Facility   Contact information:   [address] [place] [address] [phone]       Signed: Thea Gist 02/04/2015, 7:45 AM

## 2015-02-06 ENCOUNTER — Non-Acute Institutional Stay (SKILLED_NURSING_FACILITY): Payer: PPO | Admitting: Internal Medicine

## 2015-02-06 ENCOUNTER — Encounter: Payer: Self-pay | Admitting: Internal Medicine

## 2015-02-06 DIAGNOSIS — D72829 Elevated white blood cell count, unspecified: Secondary | ICD-10-CM | POA: Diagnosis not present

## 2015-02-06 DIAGNOSIS — M1711 Unilateral primary osteoarthritis, right knee: Secondary | ICD-10-CM | POA: Diagnosis not present

## 2015-02-06 DIAGNOSIS — E038 Other specified hypothyroidism: Secondary | ICD-10-CM

## 2015-02-06 DIAGNOSIS — I10 Essential (primary) hypertension: Secondary | ICD-10-CM | POA: Diagnosis not present

## 2015-02-06 DIAGNOSIS — E871 Hypo-osmolality and hyponatremia: Secondary | ICD-10-CM

## 2015-02-06 DIAGNOSIS — R2681 Unsteadiness on feet: Secondary | ICD-10-CM | POA: Diagnosis not present

## 2015-02-06 DIAGNOSIS — D62 Acute posthemorrhagic anemia: Secondary | ICD-10-CM

## 2015-02-06 DIAGNOSIS — K5900 Constipation, unspecified: Secondary | ICD-10-CM | POA: Diagnosis not present

## 2015-02-06 NOTE — Progress Notes (Signed)
Patient ID: JAKE GOODSON, female   DOB: 1933/04/06, 82 y.o.   MRN: [id number]    [facility]   PCP: Donovan Kail, MD  Code Status: Full Code  Allergies  Allergen Reactions  . Ampicillin Cough    Has patient had a PCN reaction causing immediate rash, facial/tongue/throat swelling, SOB or lightheadedness with hypotension: No Has patient had a PCN reaction causing severe rash involving mucus membranes or skin necrosis: No Has patient had a PCN reaction that required hospitalization No Has patient had a PCN reaction occurring within the last 10 years: No If all of the above answers are "NO", then may proceed with Cephalosporin use.    Chief Complaint  Patient presents with  . New Admit To SNF    New Admission     HPI:  82 y.o. patient is here for short term rehabilitation post hospital admission from 02/02/15-02/04/15 with right knee OA. She underwent right total knee arthroplasty. Her pain is under control with current pain regimen. She has been constipated. Denies any other concerns.   Review of Systems:  Constitutional: Negative for fever, chills, diaphoresis.  HENT: Negative for headache, congestion, nasal discharge Eyes: Negative for eye pain, blurred vision, double vision and discharge.  Respiratory: Negative for cough, shortness of breath and wheezing.   Cardiovascular: Negative for chest pain, palpitations, leg swelling.  Gastrointestinal: Negative for heartburn, nausea, vomiting, abdominal pain. Genitourinary: Negative for dysuria and flank pain.  Musculoskeletal: Negative for back pain, falls  Skin: Negative for itching, rash.  Neurological: Negative for dizziness Psychiatric/Behavioral: Negative for depression.    Past Medical History  Diagnosis Date  . 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 Arthritis   . Rosacea   . GERD (gastroesophageal reflux disease)    infrequently - otc med as needed  . Hypothyroidism    Past Surgical History  Procedure Laterality Date  . Cholecystectomy    . Tubal ligation    . Finger surgery      rt thumb  . Bunionectomy    . Total knee arthroplasty Right 02/02/2015    Procedure: TOTAL RIGHT KNEE ARTHROPLASTY;  Surgeon: Jene Every, MD;  Location: [facility];  Service: Orthopedics;  Laterality: Right;   Social History:   reports that she has never smoked. She does not have any smokeless tobacco history on file. She reports that she does not drink alcohol or use illicit drugs.  History reviewed. No pertinent family history.  Medications:   Medication List       This list is accurate as of: 02/06/15 11:14 AM.  Always use your most recent med list.               docusate sodium 100 MG capsule  Commonly known as:  COLACE  Take 1 capsule (100 mg total) by mouth 2 (two) times daily as needed for mild constipation.     levothyroxine 88 MCG tablet  Commonly known as:  SYNTHROID, LEVOTHROID  Take 88 mcg by mouth daily before breakfast.     oxyCODONE-acetaminophen 5-325 MG tablet  Commonly known as:  PERCOCET  Take 1 tablet by mouth every 4 (four) hours as needed.     rivaroxaban 10 MG Tabs tablet  Commonly known as:  XARELTO  Take 1 tablet (10 mg total) by mouth daily.     SYSTANE OP  Apply 1 drop to eye 2 (two)  times daily.     VITAMIN B6 PO  Take 1 tablet by mouth daily.         Physical Exam: Filed Vitals:   02/06/15 1107  BP: 152/65  Pulse: 20  Temp: 97.7 F (36.5 C)  TempSrc: Oral  Resp: 100  Height:  (1.473 m)  Weight: 147 lb (66.679 kg)  SpO2: 96%    General- elderly female, overweight, in no acute distress Head- normocephalic, atraumatic Nose- no maxillary or frontal sinus tenderness, no nasal discharge Throat- moist mucus membrane  Eyes- PERRLA, EOMI, no pallor, no icterus, no discharge, normal conjunctiva, normal sclera Neck- no cervical  lymphadenopathy Cardiovascular- normal s1,s2, no murmurs, palpable dorsalis pedis and radial pulses, right 1+ leg edema Respiratory- bilateral clear to auscultation, no wheeze, no rhonchi, no crackles, no use of accessory muscles Abdomen- bowel sounds present, soft, non tender Musculoskeletal- able to move all 4 extremities, limited right knee range of motion  Neurological- no focal deficit, alert and oriented to person, place and time Skin- warm and dry, right knee surgical incision with aquacel dressing Psychiatry- normal mood and affect    Labs reviewed: Basic Metabolic Panel:  Recent Labs  82/95/62 1100 02/03/15 0500  NA 143 134*  K 4.3 4.1  CL 108 101  CO2 26 26  GLUCOSE 140* 163*  BUN 12 7  CREATININE 0.72 0.59  CALCIUM 9.4 8.7*   CBC:  Recent Labs  01/24/15 1100 02/03/15 0500 02/04/15 0510  WBC 6.5 9.7 13.2*  HGB 13.5 12.4 11.4*  HCT 41.5 37.7 33.9*  MCV 90.0 89.8 87.4  PLT 202 211 206    Radiological Exams: Dg Knee 1-2 Views Right  01/24/2015  CLINICAL DATA:  Total knee replacement. EXAM: RIGHT KNEE - 1-2 VIEW COMPARISON:  MRI 10/01/2014. FINDINGS: Prominent tricompartment degenerative change. No acute bony abnormality. Small knee joint effusion cannot be excluded . Peripheral vascular calcification IMPRESSION: 1. Prominent tricompartment degenerative change. Small knee joint effusion cannot be excluded. No acute bony abnormality. 2.  Peripheral vascular disease. Electronically Signed   By: Maisie Fus  Register   On: 01/24/2015 12:57     Assessment/Plan  Unsteady gait Will have patient work with PT/OT as tolerated to regain strength and restore function.  Fall precautions are in place.  Right knee OA S/p right total knee arthroplasty. Will have her work with physical therapy and occupational therapy team to help with gait training and muscle strengthening exercises.fall precautions. Skin care. Encourage to be out of bed. Has f/u with orthopedics. Continue  percocet 5-325 mg q4h prn pain. Continue xarelto for dvt prophylaxis. WBAT.   Leukocytosis Afebrile, monitor cbc with diff  Blood loss anemia Post op, monitor cbc  Hyponatremia aaox 3. Monitor bmp  Constipation Discontinue colace. Start senna s 2 tab qhs and miralax daily as needed. Add prunes with breakfast  HTN Elevated SBP. Check bp bid for a week and bp remains > 140/90, start antihypertensive medication  Hypothyroidism Continue home regimen levothyroxine   Goals of care: short term rehabilitation   Labs/tests ordered: cbc, cmp 02/07/15  Family/ staff Communication: reviewed care plan with patient and nursing supervisor    Oneal Grout, MD  [facility] [phone] (Monday-Friday 8 am - 5 pm) [phone] (afterhours)

## 2015-02-07 DIAGNOSIS — M6281 Muscle weakness (generalized): Secondary | ICD-10-CM | POA: Diagnosis not present

## 2015-02-07 DIAGNOSIS — R252 Cramp and spasm: Secondary | ICD-10-CM | POA: Diagnosis not present

## 2015-02-07 DIAGNOSIS — R262 Difficulty in walking, not elsewhere classified: Secondary | ICD-10-CM | POA: Diagnosis not present

## 2015-02-07 DIAGNOSIS — M25561 Pain in right knee: Secondary | ICD-10-CM | POA: Diagnosis not present

## 2015-02-07 DIAGNOSIS — Z79899 Other long term (current) drug therapy: Secondary | ICD-10-CM | POA: Diagnosis not present

## 2015-02-07 DIAGNOSIS — Z96651 Presence of right artificial knee joint: Secondary | ICD-10-CM | POA: Diagnosis not present

## 2015-02-07 DIAGNOSIS — R2681 Unsteadiness on feet: Secondary | ICD-10-CM | POA: Diagnosis not present

## 2015-02-07 DIAGNOSIS — R600 Localized edema: Secondary | ICD-10-CM | POA: Diagnosis not present

## 2015-02-07 LAB — CBC AND DIFFERENTIAL
HCT: 30 % — AB (ref 36–46)
HEMOGLOBIN: 9.4 g/dL — AB (ref 12.0–16.0)
Neutrophils Absolute: 6 /uL
Platelets: 280 10*3/uL (ref 150–399)
WBC: 8.5 10^3/mL

## 2015-02-07 LAB — BASIC METABOLIC PANEL
BUN: 9 mg/dL (ref 4–21)
CREATININE: 0.6 mg/dL (ref 0.5–1.1)
Glucose: 109 mg/dL
POTASSIUM: 4.6 mmol/L (ref 3.4–5.3)
SODIUM: 141 mmol/L (ref 137–147)

## 2015-02-07 LAB — HEPATIC FUNCTION PANEL
ALK PHOS: 76 U/L (ref 25–125)
ALT: 31 U/L (ref 7–35)
AST: 32 U/L (ref 13–35)
Bilirubin, Total: 0.7 mg/dL

## 2015-02-08 ENCOUNTER — Non-Acute Institutional Stay: Payer: PPO | Admitting: Adult Health

## 2015-02-08 ENCOUNTER — Encounter: Payer: Self-pay | Admitting: Adult Health

## 2015-02-08 DIAGNOSIS — K5900 Constipation, unspecified: Secondary | ICD-10-CM

## 2015-02-08 DIAGNOSIS — R600 Localized edema: Secondary | ICD-10-CM | POA: Diagnosis not present

## 2015-02-08 DIAGNOSIS — E039 Hypothyroidism, unspecified: Secondary | ICD-10-CM | POA: Diagnosis not present

## 2015-02-08 DIAGNOSIS — D62 Acute posthemorrhagic anemia: Secondary | ICD-10-CM | POA: Diagnosis not present

## 2015-02-08 DIAGNOSIS — E43 Unspecified severe protein-calorie malnutrition: Secondary | ICD-10-CM | POA: Diagnosis not present

## 2015-02-08 DIAGNOSIS — Z96651 Presence of right artificial knee joint: Secondary | ICD-10-CM | POA: Diagnosis not present

## 2015-02-08 DIAGNOSIS — Z471 Aftercare following joint replacement surgery: Secondary | ICD-10-CM | POA: Diagnosis not present

## 2015-02-08 DIAGNOSIS — K219 Gastro-esophageal reflux disease without esophagitis: Secondary | ICD-10-CM | POA: Diagnosis not present

## 2015-02-08 DIAGNOSIS — M6281 Muscle weakness (generalized): Secondary | ICD-10-CM | POA: Diagnosis not present

## 2015-02-08 DIAGNOSIS — R262 Difficulty in walking, not elsewhere classified: Secondary | ICD-10-CM | POA: Diagnosis not present

## 2015-02-08 DIAGNOSIS — M25561 Pain in right knee: Secondary | ICD-10-CM | POA: Diagnosis not present

## 2015-02-08 DIAGNOSIS — R2681 Unsteadiness on feet: Secondary | ICD-10-CM | POA: Diagnosis not present

## 2015-02-08 NOTE — Progress Notes (Signed)
Patient ID: [name], female   DOB: [birth year], 82 y.o.   MRN: [id number]

## 2015-02-09 DIAGNOSIS — Z96651 Presence of right artificial knee joint: Secondary | ICD-10-CM | POA: Diagnosis not present

## 2015-02-09 DIAGNOSIS — R600 Localized edema: Secondary | ICD-10-CM | POA: Diagnosis not present

## 2015-02-09 DIAGNOSIS — M25561 Pain in right knee: Secondary | ICD-10-CM | POA: Diagnosis not present

## 2015-02-09 DIAGNOSIS — R2681 Unsteadiness on feet: Secondary | ICD-10-CM | POA: Diagnosis not present

## 2015-02-09 DIAGNOSIS — M6281 Muscle weakness (generalized): Secondary | ICD-10-CM | POA: Diagnosis not present

## 2015-02-09 DIAGNOSIS — R262 Difficulty in walking, not elsewhere classified: Secondary | ICD-10-CM | POA: Diagnosis not present

## 2015-02-10 DIAGNOSIS — R2681 Unsteadiness on feet: Secondary | ICD-10-CM | POA: Diagnosis not present

## 2015-02-10 DIAGNOSIS — M6281 Muscle weakness (generalized): Secondary | ICD-10-CM | POA: Diagnosis not present

## 2015-02-10 DIAGNOSIS — R600 Localized edema: Secondary | ICD-10-CM | POA: Diagnosis not present

## 2015-02-10 DIAGNOSIS — M25561 Pain in right knee: Secondary | ICD-10-CM | POA: Diagnosis not present

## 2015-02-10 DIAGNOSIS — Z96651 Presence of right artificial knee joint: Secondary | ICD-10-CM | POA: Diagnosis not present

## 2015-02-10 DIAGNOSIS — R262 Difficulty in walking, not elsewhere classified: Secondary | ICD-10-CM | POA: Diagnosis not present

## 2015-02-13 DIAGNOSIS — R262 Difficulty in walking, not elsewhere classified: Secondary | ICD-10-CM | POA: Diagnosis not present

## 2015-02-13 DIAGNOSIS — R600 Localized edema: Secondary | ICD-10-CM | POA: Diagnosis not present

## 2015-02-13 DIAGNOSIS — M6281 Muscle weakness (generalized): Secondary | ICD-10-CM | POA: Diagnosis not present

## 2015-02-13 DIAGNOSIS — A6 Herpesviral infection of genitalia and urogenital tract: Secondary | ICD-10-CM | POA: Diagnosis not present

## 2015-02-13 DIAGNOSIS — Z96651 Presence of right artificial knee joint: Secondary | ICD-10-CM | POA: Diagnosis not present

## 2015-02-13 DIAGNOSIS — M25561 Pain in right knee: Secondary | ICD-10-CM | POA: Diagnosis not present

## 2015-02-13 DIAGNOSIS — R2681 Unsteadiness on feet: Secondary | ICD-10-CM | POA: Diagnosis not present

## 2015-02-13 DIAGNOSIS — A282 Extraintestinal yersiniosis: Secondary | ICD-10-CM | POA: Diagnosis not present

## 2015-02-14 DIAGNOSIS — R262 Difficulty in walking, not elsewhere classified: Secondary | ICD-10-CM | POA: Diagnosis not present

## 2015-02-14 DIAGNOSIS — R600 Localized edema: Secondary | ICD-10-CM | POA: Diagnosis not present

## 2015-02-14 DIAGNOSIS — R2681 Unsteadiness on feet: Secondary | ICD-10-CM | POA: Diagnosis not present

## 2015-02-14 DIAGNOSIS — M6281 Muscle weakness (generalized): Secondary | ICD-10-CM | POA: Diagnosis not present

## 2015-02-14 DIAGNOSIS — M25561 Pain in right knee: Secondary | ICD-10-CM | POA: Diagnosis not present

## 2015-02-14 DIAGNOSIS — Z96651 Presence of right artificial knee joint: Secondary | ICD-10-CM | POA: Diagnosis not present

## 2015-02-14 DIAGNOSIS — A268 Other forms of erysipeloid: Secondary | ICD-10-CM | POA: Diagnosis not present

## 2015-02-15 DIAGNOSIS — R2681 Unsteadiness on feet: Secondary | ICD-10-CM | POA: Diagnosis not present

## 2015-02-15 DIAGNOSIS — M6281 Muscle weakness (generalized): Secondary | ICD-10-CM | POA: Diagnosis not present

## 2015-02-15 DIAGNOSIS — Z96651 Presence of right artificial knee joint: Secondary | ICD-10-CM | POA: Diagnosis not present

## 2015-02-15 DIAGNOSIS — I10 Essential (primary) hypertension: Secondary | ICD-10-CM | POA: Diagnosis not present

## 2015-02-15 DIAGNOSIS — M25561 Pain in right knee: Secondary | ICD-10-CM | POA: Diagnosis not present

## 2015-02-15 DIAGNOSIS — R262 Difficulty in walking, not elsewhere classified: Secondary | ICD-10-CM | POA: Diagnosis not present

## 2015-02-15 DIAGNOSIS — R600 Localized edema: Secondary | ICD-10-CM | POA: Diagnosis not present

## 2015-02-16 ENCOUNTER — Encounter: Payer: Self-pay | Admitting: Adult Health

## 2015-02-16 ENCOUNTER — Non-Acute Institutional Stay: Payer: PPO | Admitting: Adult Health

## 2015-02-16 DIAGNOSIS — I10 Essential (primary) hypertension: Secondary | ICD-10-CM | POA: Diagnosis not present

## 2015-02-16 DIAGNOSIS — E43 Unspecified severe protein-calorie malnutrition: Secondary | ICD-10-CM

## 2015-02-16 DIAGNOSIS — R2681 Unsteadiness on feet: Secondary | ICD-10-CM | POA: Diagnosis not present

## 2015-02-16 DIAGNOSIS — E038 Other specified hypothyroidism: Secondary | ICD-10-CM | POA: Diagnosis not present

## 2015-02-16 DIAGNOSIS — Z471 Aftercare following joint replacement surgery: Secondary | ICD-10-CM | POA: Diagnosis not present

## 2015-02-16 DIAGNOSIS — K5900 Constipation, unspecified: Secondary | ICD-10-CM | POA: Diagnosis not present

## 2015-02-16 DIAGNOSIS — D62 Acute posthemorrhagic anemia: Secondary | ICD-10-CM

## 2015-02-16 DIAGNOSIS — M25561 Pain in right knee: Secondary | ICD-10-CM | POA: Diagnosis not present

## 2015-02-16 DIAGNOSIS — M6281 Muscle weakness (generalized): Secondary | ICD-10-CM | POA: Diagnosis not present

## 2015-02-16 DIAGNOSIS — R600 Localized edema: Secondary | ICD-10-CM | POA: Diagnosis not present

## 2015-02-16 DIAGNOSIS — R262 Difficulty in walking, not elsewhere classified: Secondary | ICD-10-CM | POA: Diagnosis not present

## 2015-02-16 DIAGNOSIS — M1711 Unilateral primary osteoarthritis, right knee: Secondary | ICD-10-CM | POA: Diagnosis not present

## 2015-02-16 DIAGNOSIS — Z96651 Presence of right artificial knee joint: Secondary | ICD-10-CM | POA: Diagnosis not present

## 2015-02-16 NOTE — Progress Notes (Signed)
Patient ID: [name], female   DOB: 1933-11-30, 82 y.o.   MRN: [id number]    DATE:  02/16/2015 MRN:  [id number]  BIRTHDAY: 03-26-1933  Facility:  Nursing Home Location:  [facility]  Nursing Home Room Number: [id number]  LEVEL OF CARE:  SNF [phone]  Contact Information    Name Relation Home Work [place] Daughter [phone]    [facility] Daughter [phone]         Code Status History    Date Active Date Inactive Code Status Order ID Comments User Context   02/02/2015 11:50 AM 02/04/2015  5:44 PM Full Code [id number]  Jene Every, MD Inpatient       Chief Complaint  Patient presents with  . Discharge Note    HISTORY OF PRESENT ILLNESS:  This is a 82 year old is for discharge home with home health PT for endurance and OT for ADLs. DME:  Rolling walker. She has been admitted to [facility] on 02/04/15 from [facility] with osteoarthritis S/P right total knee arthroplasty.  Patient was admitted to this facility for short-term rehabilitation after the patient's recent hospitalization.  Patient has completed SNF rehabilitation and therapy has cleared the patient for discharge.   PAST MEDICAL HISTORY:  Past Medical History  Diagnosis Date  . 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 Arthritis   . Rosacea   . GERD (gastroesophageal reflux disease)     infrequently - otc med as needed  . Hypothyroidism   . Unsteady gait   . Primary osteoarthritis of right knee   . Acute blood loss anemia   . Leukocytosis   . Hyponatremia   . Constipation   . Benign essential HTN      CURRENT MEDICATIONS: Reviewed    Medication List       This list is accurate as of: 02/16/15 12:54 PM.  Always use your most recent med list.               levothyroxine 88 MCG tablet  Commonly known as:  SYNTHROID, LEVOTHROID  Take 88 mcg by mouth daily before  breakfast.     methocarbamol 500 MG tablet  Commonly known as:  ROBAXIN  Take 500 mg by mouth 2 (two) times daily as needed.     oxyCODONE-acetaminophen 5-325 MG tablet  Commonly known as:  PERCOCET/ROXICET  Take 1 tablet by mouth. Take 1 tablet daily at 6AM, Noon, 2PM, and 11PM     polyethylene glycol packet  Commonly known as:  MIRALAX / GLYCOLAX  Take 17 g by mouth 2 (two) times daily.     Protein Powd  Take 2 scoop by mouth 2 (two) times daily.     rivaroxaban 10 MG Tabs tablet  Commonly known as:  XARELTO  Take 1 tablet (10 mg total) by mouth daily.     sennosides-docusate sodium 8.6-50 MG tablet  Commonly known as:  SENOKOT-S  Take 2 tablets by mouth 2 (two) times daily.     SYSTANE OP  Apply 1 drop to eye 2 (two) times daily. Both eyes     VITAMIN B6 PO  Take 1 tablet by mouth daily. 100 mg            Allergies  Allergen Reactions  . Ampicillin Cough    Has patient had a PCN reaction causing immediate rash, facial/tongue/throat swelling, SOB  or lightheadedness with hypotension: No Has patient had a PCN reaction causing severe rash involving mucus membranes or skin necrosis: No Has patient had a PCN reaction that required hospitalization No Has patient had a PCN reaction occurring within the last 10 years: No If all of the above answers are "NO", then may proceed with Cephalosporin use.     REVIEW OF SYSTEMS:  GENERAL: no change in appetite, no fatigue, no weight changes, no fever, chills or weakness SKIN: Denies rash, itching, wounds, ulcer sores, or nail abnormality EYES: Denies change in vision, dry eyes, eye pain, itching or discharge EARS: Denies change in hearing, ringing in ears, or earache NOSE: Denies nasal congestion or epistaxis MOUTH and THROAT: Denies oral discomfort, gingival pain or bleeding, pain from teeth or hoarseness   RESPIRATORY: no cough, SOB, DOE, wheezing, hemoptysis CARDIAC: no chest pain,  or palpitations GI: no abdominal  pain, diarrhea, constipation, heart burn, nausea or vomiting GU: Denies dysuria, frequency, hematuria, incontinence, or discharge PSYCHIATRIC: Denies feeling of depression or anxiety. No report of hallucinations, insomnia, paranoia, or agitation   PHYSICAL EXAMINATION  GENERAL APPEARANCE: Well nourished. In no acute distress. Normal body habitus SKIN:  Right knee surgical incision is covered with Aquacel dressing, dry, no erythema HEAD: Normal in size and contour. No evidence of trauma EYES: Lids open and close normally. No blepharitis, entropion or ectropion. PERRL. Conjunctivae are clear and sclerae are white. Lenses are without opacity EARS: Pinnae are normal. Patient hears normal voice tunes of the examiner MOUTH and THROAT: Lips are without lesions. Oral mucosa is moist and without lesions. Tongue is normal in shape, size, and color and without lesions NECK: supple, trachea midline, no neck masses, no thyroid tenderness, no thyromegaly LYMPHATICS: no LAN in the neck, no supraclavicular LAN RESPIRATORY: breathing is even & unlabored, BS CTAB CARDIAC: RRR, no murmur,no extra heart sounds, RLE edema 1+ GI: abdomen soft, normal BS, no masses, no tenderness, no hepatomegaly, no splenomegaly EXTREMITIES:  Able to move 4 extremities PSYCHIATRIC: Alert and oriented X 3. Affect and behavior are appropriate  LABS/RADIOLOGY: Labs reviewed: 02/15/15  WBC 9.9 hemoglobin 10.4 hematocrit 33.4 MCV 92.0 platelet 399 Basic Metabolic Panel:  Recent Labs  40/98/11 1100 02/03/15 0500 02/07/15  NA 143 134* 141  K 4.3 4.1 4.6  CL 108 101  --   CO2 26 26  --   GLUCOSE 140* 163*  --   BUN 12 7 9   CREATININE 0.72 0.59 0.6  CALCIUM 9.4 8.7*  --    Liver Function Tests:  Recent Labs  02/07/15  AST 32  ALT 31  ALKPHOS 76    CBC:  Recent Labs  01/24/15 1100 02/03/15 0500 02/04/15 0510 02/07/15  WBC 6.5 9.7 13.2* 8.5  NEUTROABS  --   --   --  6  HGB 13.5 12.4 11.4* 9.4*  HCT 41.5 37.7  33.9* 30*  MCV 90.0 89.8 87.4  --   PLT 202 211 206 280     Dg Knee 1-2 Views Right  01/24/2015  CLINICAL DATA:  Total knee replacement. EXAM: RIGHT KNEE - 1-2 VIEW COMPARISON:  MRI 10/01/2014. FINDINGS: Prominent tricompartment degenerative change. No acute bony abnormality. Small knee joint effusion cannot be excluded . Peripheral vascular calcification IMPRESSION: 1. Prominent tricompartment degenerative change. Small knee joint effusion cannot be excluded. No acute bony abnormality. 2.  Peripheral vascular disease. Electronically Signed   By: Maisie Fus  Register   On: 01/24/2015 12:57   Dg Knee Right Port  02/02/2015  CLINICAL DATA:  Status post right total knee replacement. EXAM: PORTABLE RIGHT KNEE - 1-2 VIEW COMPARISON:  01/24/2015 FINDINGS: A right total knee prosthesis is in place with expected alignment and appearance. No periprosthetic fracture or other complicating feature is observed. Expected gas in the joint and soft tissues. Expected positioning of a polymer component. There appears to be a tube projecting over the proximal tibial and fibular shafts. At first glance this resembles the fibula but the wall thickness is too regular for this to be bone. IMPRESSION: 1. Total knee prosthesis in place without periprosthetic fracture or other complicating feature. Electronically Signed   By: Gaylyn Rong M.D.   On: 02/02/2015 10:56    ASSESSMENT/PLAN:  Right knee osteoarthritis S/P right total knee arthroplasty - for home health PT for endurance and OT for ADLs; recently changed Percocet to 5/325 mg 1 tab by mouth @ 6am, 2pm and 11pm routinely for now but will be discharged on as needed basis ; and Xarelto 10 mg 1 tab by mouth daily for DVT prophylaxis;  Robaxin 500 mg to 1 tab by mouth BID PRN for muscle spasm; follow-up with orthopedics  Anemia, acute blood loss - hemoglobin 9.4; recheck 10.4, patient has requested to discontinue FeSO4   Constipation - continue senna S2 tabs by mouth  twice a day and MiraLAX 17 g by mouth BID  Hypertension - well controlled; not on any medication  Hypothyroidism -  continue Synthroid 88 g 1 tab by mouth daily  Protein calorie malnutrition, severe - continue Procel 2 scoops by mouth twice a day        I have filled out patient's discharge paperwork and written prescriptions.  Patient will receive home health PT and OT.  DME provided:  Rolling walker  Total discharge time: Greater than 30 minutes  Discharge time involved coordination of the discharge process with Child psychotherapist, nursing staff and therapy department. Medical justification for home health services/DME verified.      [facility], NP [employer] [phone]

## 2015-02-16 NOTE — Progress Notes (Signed)
Patient ID: [name], female   DOB: 1933/09/17, 82 y.o.   MRN: [id number]

## 2015-02-16 NOTE — Progress Notes (Signed)
Patient ID: [name], female   DOB: [birth year], 82 y.o.   MRN: [id number]    DATE:  02/08/15  MRN:  [id number]  BIRTHDAY: [birth year]  Facility:  Nursing Home Location:  [facility]  Nursing Home Room Number: [id number]  LEVEL OF CARE:  SNF [phone])  Contact Information    Name Relation Home Work [place] Daughter [phone]    [facility] Daughter [phone]         Code Status History    Date Active Date Inactive Code Status Order ID Comments User Context   02/02/2015 11:50 AM 02/04/2015  5:44 PM Full Code [id number]  Jene Every, MD Inpatient       Chief Complaint  Patient presents with  . Acute Visit    Anemia,Protein-Calorie malnutrition and Constipation    HISTORY OF PRESENT ILLNESS:   This is a 82 year old female who was noted to have hgb 9.4  and albumin 2.72. She was admitted to [facility] on 02/04/15 from [facility]. She has right knee osteoarthritis or which she had right total knee arthroplasty on 02/02/15. She has been admitted to [facility] for a short-term rehabilitation.   PAST MEDICAL HISTORY:  Past Medical History  Diagnosis Date  . 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 Arthritis   . Rosacea   . GERD (gastroesophageal reflux disease)     infrequently - otc med as needed  . Hypothyroidism   . Unsteady gait   . Primary osteoarthritis of right knee   . Acute blood loss anemia   . Leukocytosis   . Hyponatremia   . Constipation   . Benign essential HTN      CURRENT MEDICATIONS: Reviewed  Patient's Medications  New Prescriptions   No medications on file  Previous Medications   FERROUS SULFATE 325 (65 FE) MG TABLET    Take 325 mg by mouth 2 (two) times daily.   LEVOTHYROXINE (SYNTHROID, LEVOTHROID) 88 MCG TABLET    Take 88 mcg by mouth daily before breakfast.   METHOCARBAMOL (ROBAXIN) 500 MG TABLET    Take  500 mg by mouth. Take 500 mg tablet at 0900, 1500, and at 11PM hold for sedation   OXYCODONE-ACETAMINOPHEN (PERCOCET/ROXICET) 5-325 MG TABLET    Take 1 tablet by mouth. Take 1 tablet daily at 6AM, Noon, 6PM, and 11PM   POLYETHYL GLYCOL-PROPYL GLYCOL (SYSTANE OP)    Apply 1 drop to eye 2 (two) times daily. Both eyes   POLYETHYLENE GLYCOL (MIRALAX / GLYCOLAX) PACKET    Take 17 g by mouth 2 (two) times daily.    PROTEIN POWD    Take 2 scoop by mouth 2 (two) times daily.   PYRIDOXINE HCL (VITAMIN B6 PO)    Take 1 tablet by mouth daily. 100 mg   RIVAROXABAN (XARELTO) 10 MG TABS TABLET    Take 1 tablet (10 mg total) by mouth daily.   SENNOSIDES-DOCUSATE SODIUM (SENOKOT-S) 8.6-50 MG TABLET    Take 2 tablets by mouth 2 (two) times daily.  Modified Medications   No medications on file  Discontinued Medications   BISACODYL (DULCOLAX) 10 MG SUPPOSITORY    Place 10 mg rectally daily as needed for moderate constipation. For 2 days per SO, stop 02/08/15   DOCUSATE SODIUM (COLACE) 100 MG CAPSULE    Take 1 capsule (100 mg total) by mouth 2 (two) times  daily as needed for mild constipation.   MAGNESIUM HYDROXIDE (MILK OF MAGNESIA) 400 MG/5ML SUSPENSION    Take 45 mLs by mouth daily as needed for mild constipation. For 2 days per SO, stop 02/08/15   OXYCODONE-ACETAMINOPHEN (PERCOCET) 5-325 MG TABLET    Take 1 tablet by mouth every 4 (four) hours as needed.     Allergies  Allergen Reactions  . Ampicillin Cough    Has patient had a PCN reaction causing immediate rash, facial/tongue/throat swelling, SOB or lightheadedness with hypotension: No Has patient had a PCN reaction causing severe rash involving mucus membranes or skin necrosis: No Has patient had a PCN reaction that required hospitalization No Has patient had a PCN reaction occurring within the last 10 years: No If all of the above answers are "NO", then may proceed with Cephalosporin use.     REVIEW OF SYSTEMS:  GENERAL: no change in appetite, no  fatigue, no weight changes, no fever, chills or weakness EYES: Denies change in vision, dry eyes, eye pain, itching or discharge EARS: Denies change in hearing, ringing in ears, or earache NOSE: Denies nasal congestion or epistaxis MOUTH and THROAT: Denies oral discomfort, gingival pain or bleeding, pain from teeth or hoarseness   RESPIRATORY: no cough, SOB, DOE, wheezing, hemoptysis CARDIAC: no chest pain, edema or palpitations GI: no abdominal pain, diarrhea, heart burn, nausea or vomiting, +constipation GU: Denies dysuria, frequency, hematuria, incontinence, or discharge PSYCHIATRIC: Denies feeling of depression or anxiety. No report of hallucinations, insomnia, paranoia, or agitation   PHYSICAL EXAMINATION  GENERAL APPEARANCE: Well nourished. In no acute distress. Normal body habitus SKIN:  Right knee surgical incision is covered with aquacel dressing, dry, no erythema HEAD: Normal in size and contour. No evidence of trauma EYES: Lids open and close normally. No blepharitis, entropion or ectropion. PERRL. Conjunctivae are clear and sclerae are white. Lenses are without opacity EARS: Pinnae are normal. Patient hears normal voice tunes of the examiner MOUTH and THROAT: Lips are without lesions. Oral mucosa is moist and without lesions. Tongue is normal in shape, size, and color and without lesions NECK: supple, trachea midline, no neck masses, no thyroid tenderness, no thyromegaly LYMPHATICS: no LAN in the neck, no supraclavicular LAN RESPIRATORY: breathing is even & unlabored, BS CTAB CARDIAC: RRR, no murmur,no extra heart sounds, no edema GI: abdomen soft, normal BS, no masses, no tenderness, no hepatomegaly, no splenomegaly EXTREMITIES:  Able to move 4 extremities PSYCHIATRIC: Alert and oriented X 3. Affect and behavior are appropriate  LABS/RADIOLOGY: Labs reviewed: Basic Metabolic Panel:  Recent Labs  16/10/96 1100 02/03/15 0500 02/07/15  NA 143 134* 141  K 4.3 4.1 4.6    CL 108 101  --   CO2 26 26  --   GLUCOSE 140* 163*  --   BUN CREATININE 0.72 0.59 0.6  CALCIUM 9.4 8.7*  --    Liver Function Tests:  Recent Labs  02/07/15  AST 32  ALT 31  ALKPHOS 76   CBC:  Recent Labs  01/24/15 1100 02/03/15 0500 02/04/15 0510 02/07/15  WBC 6.5 9.7 13.2* 8.5  NEUTROABS  --   --   --  6  HGB 13.5 12.4 11.4* 9.4*  HCT 41.5 37.7 33.9* 30*  MCV 90.0 89.8 87.4  --   PLT 202 211 206 280    Dg Knee 1-2 Views Right  01/24/2015  CLINICAL DATA:  Total knee replacement. EXAM: RIGHT KNEE - 1-2 VIEW COMPARISON:  MRI 10/01/2014. FINDINGS:  Prominent tricompartment degenerative change. No acute bony abnormality. Small knee joint effusion cannot be excluded . Peripheral vascular calcification IMPRESSION: 1. Prominent tricompartment degenerative change. Small knee joint effusion cannot be excluded. No acute bony abnormality. 2.  Peripheral vascular disease. Electronically Signed   By: Maisie Fus  Register   On: 01/24/2015 12:57   Dg Knee Right Port  02/02/2015  CLINICAL DATA:  Status post right total knee replacement. EXAM: PORTABLE RIGHT KNEE - 1-2 VIEW COMPARISON:  01/24/2015 FINDINGS: A right total knee prosthesis is in place with expected alignment and appearance. No periprosthetic fracture or other complicating feature is observed. Expected gas in the joint and soft tissues. Expected positioning of a polymer component. There appears to be a tube projecting over the proximal tibial and fibular shafts. At first glance this resembles the fibula but the wall thickness is too regular for this to be bone. IMPRESSION: 1. Total knee prosthesis in place without periprosthetic fracture or other complicating feature. Electronically Signed   By: Gaylyn Rong M.D.   On: 02/02/2015 10:56    ASSESSMENT/PLAN:  Anemia, acute blood loss - mogul been 9.4; start for sulfate 325 mg 1 tab by mouth twice a day; check CBC in 1 week  Protein calorie malnutrition, severe - albumin  2.72; start Procel 2 scoops by mouth twice a day; RD consult  Constipation - continue MiraLAX 17 g by mouth twice a day and increase senna S2 tabs by mouth twice a day      [facility], NP [employer] [phone]

## 2015-02-17 DIAGNOSIS — R600 Localized edema: Secondary | ICD-10-CM | POA: Diagnosis not present

## 2015-02-17 DIAGNOSIS — M6281 Muscle weakness (generalized): Secondary | ICD-10-CM | POA: Diagnosis not present

## 2015-02-17 DIAGNOSIS — M25561 Pain in right knee: Secondary | ICD-10-CM | POA: Diagnosis not present

## 2015-02-17 DIAGNOSIS — Z96651 Presence of right artificial knee joint: Secondary | ICD-10-CM | POA: Diagnosis not present

## 2015-02-17 DIAGNOSIS — R252 Cramp and spasm: Secondary | ICD-10-CM | POA: Diagnosis not present

## 2015-02-17 DIAGNOSIS — R2681 Unsteadiness on feet: Secondary | ICD-10-CM | POA: Diagnosis not present

## 2015-02-17 DIAGNOSIS — Z471 Aftercare following joint replacement surgery: Secondary | ICD-10-CM | POA: Diagnosis not present

## 2015-02-17 DIAGNOSIS — R262 Difficulty in walking, not elsewhere classified: Secondary | ICD-10-CM | POA: Diagnosis not present

## 2015-02-19 DIAGNOSIS — Z7982 Long term (current) use of aspirin: Secondary | ICD-10-CM | POA: Diagnosis not present

## 2015-02-19 DIAGNOSIS — Z9181 History of falling: Secondary | ICD-10-CM | POA: Diagnosis not present

## 2015-02-19 DIAGNOSIS — M1990 Unspecified osteoarthritis, unspecified site: Secondary | ICD-10-CM | POA: Diagnosis not present

## 2015-02-19 DIAGNOSIS — E039 Hypothyroidism, unspecified: Secondary | ICD-10-CM | POA: Diagnosis not present

## 2015-02-19 DIAGNOSIS — Z79891 Long term (current) use of opiate analgesic: Secondary | ICD-10-CM | POA: Diagnosis not present

## 2015-02-19 DIAGNOSIS — Z471 Aftercare following joint replacement surgery: Secondary | ICD-10-CM | POA: Diagnosis not present

## 2015-02-19 DIAGNOSIS — Z96651 Presence of right artificial knee joint: Secondary | ICD-10-CM | POA: Diagnosis not present

## 2015-02-27 DIAGNOSIS — Z79891 Long term (current) use of opiate analgesic: Secondary | ICD-10-CM | POA: Diagnosis not present

## 2015-02-27 DIAGNOSIS — M1990 Unspecified osteoarthritis, unspecified site: Secondary | ICD-10-CM | POA: Diagnosis not present

## 2015-02-27 DIAGNOSIS — Z96651 Presence of right artificial knee joint: Secondary | ICD-10-CM | POA: Diagnosis not present

## 2015-02-27 DIAGNOSIS — E039 Hypothyroidism, unspecified: Secondary | ICD-10-CM | POA: Diagnosis not present

## 2015-02-27 DIAGNOSIS — Z9181 History of falling: Secondary | ICD-10-CM | POA: Diagnosis not present

## 2015-02-27 DIAGNOSIS — Z471 Aftercare following joint replacement surgery: Secondary | ICD-10-CM | POA: Diagnosis not present

## 2015-02-27 DIAGNOSIS — Z7982 Long term (current) use of aspirin: Secondary | ICD-10-CM | POA: Diagnosis not present

## 2015-03-03 ENCOUNTER — Other Ambulatory Visit: Payer: Self-pay | Admitting: Adult Health

## 2015-03-06 DIAGNOSIS — M1711 Unilateral primary osteoarthritis, right knee: Secondary | ICD-10-CM | POA: Diagnosis not present

## 2015-03-08 DIAGNOSIS — M1711 Unilateral primary osteoarthritis, right knee: Secondary | ICD-10-CM | POA: Diagnosis not present

## 2015-03-13 DIAGNOSIS — M1711 Unilateral primary osteoarthritis, right knee: Secondary | ICD-10-CM | POA: Diagnosis not present

## 2015-03-16 DIAGNOSIS — Z471 Aftercare following joint replacement surgery: Secondary | ICD-10-CM | POA: Diagnosis not present

## 2015-03-16 DIAGNOSIS — Z96651 Presence of right artificial knee joint: Secondary | ICD-10-CM | POA: Diagnosis not present

## 2015-03-16 DIAGNOSIS — M1711 Unilateral primary osteoarthritis, right knee: Secondary | ICD-10-CM | POA: Diagnosis not present

## 2015-03-20 DIAGNOSIS — M1711 Unilateral primary osteoarthritis, right knee: Secondary | ICD-10-CM | POA: Diagnosis not present

## 2015-03-22 DIAGNOSIS — M1711 Unilateral primary osteoarthritis, right knee: Secondary | ICD-10-CM | POA: Diagnosis not present

## 2015-03-27 DIAGNOSIS — M1711 Unilateral primary osteoarthritis, right knee: Secondary | ICD-10-CM | POA: Diagnosis not present

## 2015-03-29 DIAGNOSIS — Z4789 Encounter for other orthopedic aftercare: Secondary | ICD-10-CM | POA: Diagnosis not present

## 2015-03-29 DIAGNOSIS — M1711 Unilateral primary osteoarthritis, right knee: Secondary | ICD-10-CM | POA: Diagnosis not present

## 2015-03-29 DIAGNOSIS — M1712 Unilateral primary osteoarthritis, left knee: Secondary | ICD-10-CM | POA: Diagnosis not present

## 2015-04-03 DIAGNOSIS — M1711 Unilateral primary osteoarthritis, right knee: Secondary | ICD-10-CM | POA: Diagnosis not present

## 2015-04-05 DIAGNOSIS — M1711 Unilateral primary osteoarthritis, right knee: Secondary | ICD-10-CM | POA: Diagnosis not present

## 2015-04-06 DIAGNOSIS — Z131 Encounter for screening for diabetes mellitus: Secondary | ICD-10-CM | POA: Diagnosis not present

## 2015-04-06 DIAGNOSIS — Z0000 Encounter for general adult medical examination without abnormal findings: Secondary | ICD-10-CM | POA: Diagnosis not present

## 2015-04-06 DIAGNOSIS — Z23 Encounter for immunization: Secondary | ICD-10-CM | POA: Diagnosis not present

## 2015-04-06 DIAGNOSIS — E782 Mixed hyperlipidemia: Secondary | ICD-10-CM | POA: Diagnosis not present

## 2015-04-06 DIAGNOSIS — E039 Hypothyroidism, unspecified: Secondary | ICD-10-CM | POA: Diagnosis not present

## 2015-04-10 DIAGNOSIS — M1711 Unilateral primary osteoarthritis, right knee: Secondary | ICD-10-CM | POA: Diagnosis not present

## 2015-04-13 DIAGNOSIS — M1711 Unilateral primary osteoarthritis, right knee: Secondary | ICD-10-CM | POA: Diagnosis not present

## 2015-04-17 DIAGNOSIS — M1711 Unilateral primary osteoarthritis, right knee: Secondary | ICD-10-CM | POA: Diagnosis not present

## 2015-04-19 DIAGNOSIS — M1711 Unilateral primary osteoarthritis, right knee: Secondary | ICD-10-CM | POA: Diagnosis not present

## 2015-04-26 DIAGNOSIS — Z471 Aftercare following joint replacement surgery: Secondary | ICD-10-CM | POA: Diagnosis not present

## 2015-04-26 DIAGNOSIS — M1712 Unilateral primary osteoarthritis, left knee: Secondary | ICD-10-CM | POA: Diagnosis not present

## 2015-04-26 DIAGNOSIS — Z96651 Presence of right artificial knee joint: Secondary | ICD-10-CM | POA: Diagnosis not present

## 2015-05-02 DIAGNOSIS — M1711 Unilateral primary osteoarthritis, right knee: Secondary | ICD-10-CM | POA: Diagnosis not present

## 2015-05-04 DIAGNOSIS — M1711 Unilateral primary osteoarthritis, right knee: Secondary | ICD-10-CM | POA: Diagnosis not present

## 2015-05-08 DIAGNOSIS — M1711 Unilateral primary osteoarthritis, right knee: Secondary | ICD-10-CM | POA: Diagnosis not present

## 2015-05-11 DIAGNOSIS — M1711 Unilateral primary osteoarthritis, right knee: Secondary | ICD-10-CM | POA: Diagnosis not present

## 2015-05-16 DIAGNOSIS — M1711 Unilateral primary osteoarthritis, right knee: Secondary | ICD-10-CM | POA: Diagnosis not present

## 2015-05-18 DIAGNOSIS — M1711 Unilateral primary osteoarthritis, right knee: Secondary | ICD-10-CM | POA: Diagnosis not present

## 2015-05-26 DIAGNOSIS — M1711 Unilateral primary osteoarthritis, right knee: Secondary | ICD-10-CM | POA: Diagnosis not present

## 2015-05-29 DIAGNOSIS — M1711 Unilateral primary osteoarthritis, right knee: Secondary | ICD-10-CM | POA: Diagnosis not present

## 2015-05-30 DIAGNOSIS — M1712 Unilateral primary osteoarthritis, left knee: Secondary | ICD-10-CM | POA: Diagnosis not present

## 2015-06-01 DIAGNOSIS — M1711 Unilateral primary osteoarthritis, right knee: Secondary | ICD-10-CM | POA: Diagnosis not present

## 2015-06-07 DIAGNOSIS — M1712 Unilateral primary osteoarthritis, left knee: Secondary | ICD-10-CM | POA: Diagnosis not present

## 2015-06-12 DIAGNOSIS — M1711 Unilateral primary osteoarthritis, right knee: Secondary | ICD-10-CM | POA: Diagnosis not present

## 2015-06-14 ENCOUNTER — Other Ambulatory Visit: Payer: Self-pay | Admitting: Family Medicine

## 2015-06-14 DIAGNOSIS — Z1231 Encounter for screening mammogram for malignant neoplasm of breast: Secondary | ICD-10-CM

## 2015-06-15 DIAGNOSIS — M1712 Unilateral primary osteoarthritis, left knee: Secondary | ICD-10-CM | POA: Diagnosis not present

## 2015-06-23 DIAGNOSIS — H524 Presbyopia: Secondary | ICD-10-CM | POA: Diagnosis not present

## 2015-06-23 DIAGNOSIS — H5203 Hypermetropia, bilateral: Secondary | ICD-10-CM | POA: Diagnosis not present

## 2015-06-29 ENCOUNTER — Ambulatory Visit
Admission: RE | Admit: 2015-06-29 | Discharge: 2015-06-29 | Disposition: A | Discharge status: Home / Self Care | Payer: PPO | Source: Ambulatory Visit | Attending: Family Medicine | Admitting: Family Medicine

## 2015-06-29 DIAGNOSIS — Z1231 Encounter for screening mammogram for malignant neoplasm of breast: Secondary | ICD-10-CM | POA: Diagnosis not present

## 2015-07-17 DIAGNOSIS — Z96651 Presence of right artificial knee joint: Secondary | ICD-10-CM | POA: Diagnosis not present

## 2015-07-17 DIAGNOSIS — Z471 Aftercare following joint replacement surgery: Secondary | ICD-10-CM | POA: Diagnosis not present

## 2015-07-17 DIAGNOSIS — M4806 Spinal stenosis, lumbar region: Secondary | ICD-10-CM | POA: Diagnosis not present

## 2015-08-04 DIAGNOSIS — M1712 Unilateral primary osteoarthritis, left knee: Secondary | ICD-10-CM | POA: Diagnosis not present

## 2015-08-29 DIAGNOSIS — G8929 Other chronic pain: Secondary | ICD-10-CM | POA: Diagnosis not present

## 2015-08-29 DIAGNOSIS — M25562 Pain in left knee: Secondary | ICD-10-CM | POA: Diagnosis not present

## 2015-08-29 DIAGNOSIS — M1712 Unilateral primary osteoarthritis, left knee: Secondary | ICD-10-CM | POA: Diagnosis not present

## 2015-09-04 ENCOUNTER — Ambulatory Visit: Payer: Self-pay | Admitting: Orthopedic Surgery

## 2015-09-15 ENCOUNTER — Encounter (HOSPITAL_COMMUNITY)
Admission: RE | Admit: 2015-09-15 | Discharge: 2015-09-15 | Disposition: A | Discharge status: Home / Self Care | Payer: PPO | Source: Ambulatory Visit | Attending: Specialist | Admitting: Specialist

## 2015-09-15 ENCOUNTER — Encounter (HOSPITAL_COMMUNITY): Payer: Self-pay

## 2015-09-15 ENCOUNTER — Ambulatory Visit: Payer: Self-pay | Admitting: Orthopedic Surgery

## 2015-09-15 DIAGNOSIS — Z01818 Encounter for other preprocedural examination: Secondary | ICD-10-CM | POA: Diagnosis not present

## 2015-09-15 LAB — CBC
HCT: 40.3 % (ref 36.0–46.0)
HEMOGLOBIN: 13.4 g/dL (ref 12.0–15.0)
MCH: 29.1 pg (ref 26.0–34.0)
MCHC: 33.3 g/dL (ref 30.0–36.0)
MCV: 87.4 fL (ref 78.0–100.0)
Platelets: 246 10*3/uL (ref 150–400)
RBC: 4.61 MIL/uL (ref 3.87–5.11)
RDW: 13.6 % (ref 11.5–15.5)
WBC: 8.6 10*3/uL (ref 4.0–10.5)

## 2015-09-15 LAB — BASIC METABOLIC PANEL
ANION GAP: 5 (ref 5–15)
BUN: 16 mg/dL (ref 6–20)
CALCIUM: 9 mg/dL (ref 8.9–10.3)
CO2: 30 mmol/L (ref 22–32)
Chloride: 105 mmol/L (ref 101–111)
Creatinine, Ser: 0.94 mg/dL (ref 0.44–1.00)
GFR calc Af Amer: >60 mL/min (ref >60)
GFR, EST NON AFRICAN AMERICAN: 56 mL/min — AB (ref >60)
GLUCOSE: 113 mg/dL — AB (ref 65–99)
Potassium: 4.5 mmol/L (ref 3.5–5.1)
Sodium: 140 mmol/L (ref 135–145)

## 2015-09-15 LAB — TYPE AND SCREEN
ABO/RH(D): A POS
Antibody Screen: NEGATIVE

## 2015-09-15 LAB — URINALYSIS, ROUTINE W REFLEX MICROSCOPIC
Bilirubin Urine: NEGATIVE
GLUCOSE, UA: NEGATIVE mg/dL
Hgb urine dipstick: NEGATIVE
KETONES UR: NEGATIVE mg/dL
NITRITE: NEGATIVE
PH: 6 (ref 5.0–8.0)
PROTEIN: NEGATIVE mg/dL
Specific Gravity, Urine: 1.016 (ref 1.005–1.030)

## 2015-09-15 LAB — PROTIME-INR
INR: 0.97
PROTHROMBIN TIME: 12.9 s (ref 11.4–15.2)

## 2015-09-15 LAB — URINE MICROSCOPIC-ADD ON: RBC / HPF: NONE SEEN RBC/hpf (ref 0–5)

## 2015-09-15 LAB — SURGICAL PCR SCREEN
MRSA, PCR: NEGATIVE
STAPHYLOCOCCUS AUREUS: NEGATIVE

## 2015-09-15 LAB — APTT: APTT: 28 s (ref 24–36)

## 2015-09-15 NOTE — Progress Notes (Signed)
ekg 1/17 epic 

## 2015-09-15 NOTE — H&P (Signed)
[name] DOB: 1933-09-05 Widowed / Language: [place] / Race: White Female  H&P Date: 09/15/15  Chief Complaint: Left knee pain  History of Present Illness The patient is a 82 year old female who comes in today for a preoperative History and Physical. The patient is scheduled for a left total knee arthroplasty to be performed by Dr. Javier Docker, MD at [facility] on 09/21/15. Dr. Shelle Iron and the patient mutually agreed to proceed with a total knee replacement. Risks and benefits of the procedure were discussed including stiffness, suboptimal range of motion, persistent pain, infection requiring removal of prosthesis and reinsertion, need for prophylactic antibiotics in the future, for example, dental procedures, possible need for manipulation, revision in the future and also anesthetic complications including DVT, PE, etc. We discussed the perioperative course, time in the hospital, postoperative recovery and the need for elevation to control swelling. We also discussed the predicted range of motion and the probability that squatting and kneeling would be unobtainable in the future. In addition, postoperative anticoagulation was discussed. We have obtained preoperative medical clearance as necessary. Provided her illustrated handout and discussed it in detail. They will enroll in the total joint replacement educational forum at the hospital. Her daughter is here with her today. Last time she went to [facility] and had issues with swelling and the leg not remaining elevated. She would like to D/C to home this time with HHPT then start outpt PT here post-op with [facility]. She was nauseous with some vomiting in the hospital for her R TKA but does not remember much of her hospital stay. She does recall excess constipation at [facility] even requiring an enema. She has dulcolax at home if needed still. She has a walker and will be getting a cane. She will be borrowing a commode and shower  seat.  Problem List/Past Medical Hx Spinal stenosis, lumbar (M48.06)  Hypercholesterolemia  Gastroesophageal Reflux Disease  Hypothyroidism  Valve Disorder (one valve clicks)  Osteoarthritis  Hemorrhoids    Allergies Ampicillin Sodium *PENICILLINS*  Allergies Reconciled   Family History  Heart Disease  Father. Hypertension  Mother. Rheumatoid Arthritis  Sister. Cancer  Brother, Sister. Cerebrovascular Accident  Mother. Congestive Heart Failure  Father. First Degree Relatives   Social History Tobacco use  Never smoker. 10/19/2014 Children  2 Current work status  retired Never consumed alcohol  10/19/2014: Never consumed alcohol Exercise  Exercises daily; does other Living situation  live alone, one story home Marital status  widowed No history of drug/alcohol rehab  Not under pain contract  Number of flights of stairs before winded  2-3 Tobacco / smoke exposure  10/19/2014: no Post-Surgical Plans  home with HHPT, daughter will stay with her  Medication History Synthroid ( Tablet, Oral) Active. Medications Reconciled  Pregnancy / Birth History  Pregnant  no  Past Surgical History  Gallbladder Surgery  laporoscopic 1993 Tubal Ligation  Foot Surgery - Right  bunionectomy, 2014 Total Knee Replacement - Right   Review of Systems General Not Present- Chills, Fatigue, Fever, Memory Loss, Night Sweats, Weight Gain and Weight Loss. Skin Not Present- Eczema, Hives, Itching, Lesions and Rash. HEENT Not Present- Dentures, Double Vision, Headache, Hearing Loss, Tinnitus and Visual Loss. Respiratory Not Present- Allergies, Chronic Cough, Coughing up blood, Shortness of breath at rest and Shortness of breath with exertion. Cardiovascular Not Present- Chest Pain, Difficulty Breathing Lying Down, Murmur, Palpitations, Racing/skipping heartbeats and Swelling. Gastrointestinal Not Present- Abdominal Pain, Bloody Stool, Constipation,  Diarrhea, Difficulty Swallowing, Heartburn,  Jaundice, Loss of appetitie, Nausea and Vomiting. Female Genitourinary Not Present- Blood in Urine, Discharge, Flank Pain, Incontinence, Painful Urination, Urgency, Urinary frequency, Urinary Retention, Urinating at Night and Weak urinary stream. Musculoskeletal Present- Joint Pain, Joint Swelling and Morning Stiffness. Not Present- Back Pain, Muscle Pain, Muscle Weakness and Spasms. Neurological Not Present- Blackout spells, Difficulty with balance, Dizziness, Paralysis, Tremor and Weakness. Psychiatric Not Present- Insomnia.  Physical Exam  General Mental Status -Alert, cooperative and good historian. General Appearance-pleasant, Not in acute distress. Orientation-Oriented X3. Build & Nutrition-Well nourished and Well developed. Gait-Antalgic.  Head and Neck Head-normocephalic, atraumatic . Neck Global Assessment - supple, no bruit auscultated on the right, no bruit auscultated on the left.  Eye Pupil - Bilateral-Regular and Round. Motion - Bilateral-EOMI.  Chest and Lung Exam Auscultation Breath sounds - clear at anterior chest wall and clear at posterior chest wall. Adventitious sounds - No Adventitious sounds.  Cardiovascular Auscultation Rhythm - Regular rate and rhythm. Heart Sounds - S1 WNL and S2 WNL. Murmurs & Other Heart Sounds - Auscultation of the heart reveals - No Murmurs.  Abdomen Palpation/Percussion Tenderness - Abdomen is non-tender to palpation. Rigidity (guarding) - Abdomen is soft. Auscultation Auscultation of the abdomen reveals - Bowel sounds normal.  Female Genitourinary Not done, not pertinent to present illness  Musculoskeletal Moderate distress. Walks with an antalgic gait. Exquisitely tender in medial joint line. Patellofemoral pain with compression. Ranges -5 to 110. Knee exam on inspection reveals no evidence of soft tissue swelling, ecchymosis, deformity or erythema. On palpation there  is no tenderness in the medial and lateral joint line. No patellofemoral pain with compression. Nontender over the fibular head or the peroneal nerve. Nontender over the quadriceps insertion of the patellar ligament insertion. The range of motion was full. Provocative maneuvers revealed a negative Lachman, negative anterior and posterior drawer and a negative McMurray. No instability was noted with varus and valgus stressing at 0 or 30 degrees. On manual motor test the quadriceps and hamstrings were 5/5. Sensory exam was intact to light touch.  Imaging xrays with bone on bone arthrosis left knee medial compartment.  Assessment & Plan Primary osteoarthritis of left knee (M17.12)  Pt with end-stage Left knee DJD, bone-on-bone, refractory to conservative tx, scheduled for Left total knee replacement by Dr. Shelle IronBeane on 09/21/15. We again discussed the procedure itself as well as risks, complications and alternatives, including but not limited to DVT, PE, infx, bleeding, failure of procedure, need for secondary procedure including manipulation, nerve injury, ongoing pain/symptoms, anesthesia risk, even stroke or death. Also discussed typical post-op protocols, activity restrictions, need for PT, flexion/extension exercises, time out of work, ice and elevation. Discussed need for DVT ppx post-op with Xarelto then ASA per protocol. Discussed dental ppx. Also discussed limitations post-operatively such as kneeling and squatting. All questions were answered. Patient desires to proceed with surgery as scheduled. Will hold ASA and NSAIDs accordingly. Will remain NPO after MN night before surgery. [facility]. Plan Xarelto in hospital then home with ASA 325mg  BID post-op for DVT ppx. Plan Percocet, Colace, Miralax for D/C and Movantek peri-operatively. Plan home with HHPT post-op with daughter at home for assistance. Will follow up 10-14 days post-op for suture removal and xrays.  Plan left  total knee replacement  Signed electronically by Dorothy SparkJaclyn M Taniah Reinecke, PA-C for Dr. Shelle IronBeane

## 2015-09-15 NOTE — Patient Instructions (Signed)
[name]  09/15/2015   Your procedure is scheduled on: 09/21/15  Report to [facility]  Entrance take [place]  elevators to 3rd floor to  Short Stay Center at 0730 AM.  Call this number if you have problems the morning of surgery [phone]   Remember: ONLY 1 PERSON MAY GO WITH YOU TO SHORT STAY TO GET  READY MORNING OF YOUR SURGERY.  Do not eat food or drink liquids :After Midnight.     Take these medicines the morning of surgery with A SIP OF WATER: Levothyroxine DO NOT TAKE ANY DIABETIC MEDICATIONS DAY OF YOUR SURGERY                               You may not have any metal on your body including hair pins and              piercings  Do not wear jewelry, make-up, lotions, powders or perfumes, deodorant             Do not wear nail polish.  Do not shave  48 hours prior to surgery.              Men may shave face and neck.   Do not bring valuables to the hospital. [facility] IS NOT             RESPONSIBLE   FOR VALUABLES.  Contacts, dentures or bridgework may not be worn into surgery.  Leave suitcase in the car. After surgery it may be brought to your room.            [facility] - Preparing for Surgery Before surgery, you can play an important role.  Because skin is not sterile, your skin needs to be as free of germs as possible.  You can reduce the number of germs on your skin by washing with CHG (chlorahexidine gluconate) soap before surgery.  CHG is an antiseptic cleaner which kills germs and bonds with the skin to continue killing germs even after washing. Please DO NOT use if you have an allergy to CHG or antibacterial soaps.  If your skin becomes reddened/irritated stop using the CHG and inform your nurse when you arrive at Short Stay. Do not shave (including legs and underarms) for at least 48 hours prior to the first CHG shower.  You may shave your face/neck. Please follow these instructions carefully:  1.  Shower with CHG Soap the night  before surgery and the  morning of Surgery.  2.  If you choose to wash your hair, wash your hair first as usual with your  normal  shampoo.  3.  After you shampoo, rinse your hair and body thoroughly to remove the  shampoo.                           4.  Use CHG as you would any other liquid soap.  You can apply chg directly  to the skin and wash                       Gently with a scrungie or clean washcloth.  5.  Apply the CHG Soap to your body ONLY FROM THE NECK DOWN.   Do not use on face/ open  Wound or open sores. Avoid contact with eyes, ears mouth and genitals (private parts).                       Wash face,  Genitals (private parts) with your normal soap.             6.  Wash thoroughly, paying special attention to the area where your surgery  will be performed.  7.  Thoroughly rinse your body with warm water from the neck down.  8.  DO NOT shower/wash with your normal soap after using and rinsing off  the CHG Soap.                9.  Pat yourself dry with a clean towel.            10.  Wear clean pajamas.            11.  Place clean sheets on your bed the night of your first shower and do not  sleep with pets. Day of Surgery : Do not apply any lotions/deodorants the morning of surgery.  Please wear clean clothes to the hospital/surgery center.  FAILURE TO FOLLOW THESE INSTRUCTIONS MAY RESULT IN THE CANCELLATION OF YOUR SURGERY PATIENT SIGNATURE_________________________________  NURSE SIGNATURE__________________________________  ________________________________________________________________________  WHAT IS A BLOOD TRANSFUSION? Blood Transfusion Information  A transfusion is the replacement of blood or some of its parts. Blood is made up of multiple cells which provide different functions.  Red blood cells carry oxygen and are used for blood loss replacement.  White blood cells fight against infection.  Platelets control bleeding.  Plasma helps clot  blood.  Other blood products are available for specialized needs, such as hemophilia or other clotting disorders. BEFORE THE TRANSFUSION  Who gives blood for transfusions?   Healthy volunteers who are fully evaluated to make sure their blood is safe. This is blood bank blood. Transfusion therapy is the safest it has ever been in the practice of medicine. Before blood is taken from a donor, a complete history is taken to make sure that person has no history of diseases nor engages in risky social behavior (examples are intravenous drug use or sexual activity with multiple partners). The donor's travel history is screened to minimize risk of transmitting infections, such as malaria. The donated blood is tested for signs of infectious diseases, such as HIV and hepatitis. The blood is then tested to be sure it is compatible with you in order to minimize the chance of a transfusion reaction. If you or a relative donates blood, this is often done in anticipation of surgery and is not appropriate for emergency situations. It takes many days to process the donated blood. RISKS AND COMPLICATIONS Although transfusion therapy is very safe and saves many lives, the main dangers of transfusion include:   Getting an infectious disease.  Developing a transfusion reaction. This is an allergic reaction to something in the blood you were given. Every precaution is taken to prevent this. The decision to have a blood transfusion has been considered carefully by your caregiver before blood is given. Blood is not given unless the benefits outweigh the risks. AFTER THE TRANSFUSION  Right after receiving a blood transfusion, you will usually feel much better and more energetic. This is especially true if your red blood cells have gotten low (anemic). The transfusion raises the level of the red blood cells which carry oxygen, and this usually causes an energy increase.  The  nurse administering the transfusion will monitor  you carefully for complications. HOME CARE INSTRUCTIONS  No special instructions are needed after a transfusion. You may find your energy is better. Speak with your caregiver about any limitations on activity for underlying diseases you may have. SEEK MEDICAL CARE IF:   Your condition is not improving after your transfusion.  You develop redness or irritation at the intravenous (IV) site. SEEK IMMEDIATE MEDICAL CARE IF:  Any of the following symptoms occur over the next 12 hours:  Shaking chills.  You have a temperature by mouth above 102 F (38.9 C), not controlled by medicine.  Chest, back, or muscle pain.  People around you feel you are not acting correctly or are confused.  Shortness of breath or difficulty breathing.  Dizziness and fainting.  You get a rash or develop hives.  You have a decrease in urine output.  Your urine turns a dark color or changes to pink, red, or brown. Any of the following symptoms occur over the next 10 days:  You have a temperature by mouth above 102 F (38.9 C), not controlled by medicine.  Shortness of breath.  Weakness after normal activity.  The white part of the eye turns yellow (jaundice).  You have a decrease in the amount of urine or are urinating less often.  Your urine turns a dark color or changes to pink, red, or brown. Document Released: 12/22/1999 Document Revised: 03/18/2011 Document Reviewed: 08/10/2007 ExitCare Patient Information 2014 [place].  _______________________________________________________________________  Incentive Spirometer  An incentive spirometer is a tool that can help keep your lungs clear and active. This tool measures how well you are filling your lungs with each breath. Taking long deep breaths may help reverse or decrease the chance of developing breathing (pulmonary) problems (especially infection) following:  A long period of time when you are unable to move or be active. BEFORE THE  PROCEDURE   If the spirometer includes an indicator to show your best effort, your nurse or respiratory therapist will set it to a desired goal.  If possible, sit up straight or lean slightly forward. Try not to slouch.  Hold the incentive spirometer in an upright position. INSTRUCTIONS FOR USE  1. Sit on the edge of your bed if possible, or sit up as far as you can in bed or on a chair. 2. Hold the incentive spirometer in an upright position. 3. Breathe out normally. 4. Place the mouthpiece in your mouth and seal your lips tightly around it. 5. Breathe in slowly and as deeply as possible, raising the piston or the ball toward the top of the column. 6. Hold your breath for 3-5 seconds or for as long as possible. Allow the piston or ball to fall to the bottom of the column. 7. Remove the mouthpiece from your mouth and breathe out normally. 8. Rest for a few seconds and repeat Steps 1 through 7 at least 10 times every 1-2 hours when you are awake. Take your time and take a few normal breaths between deep breaths. 9. The spirometer may include an indicator to show your best effort. Use the indicator as a goal to work toward during each repetition. 10. After each set of 10 deep breaths, practice coughing to be sure your lungs are clear. If you have an incision (the cut made at the time of surgery), support your incision when coughing by placing a pillow or rolled up towels firmly against it. Once you are able to get  out of bed, walk around indoors and cough well. You may stop using the incentive spirometer when instructed by your caregiver.  RISKS AND COMPLICATIONS  Take your time so you do not get dizzy or light-headed.  If you are in pain, you may need to take or ask for pain medication before doing incentive spirometry. It is harder to take a deep breath if you are having pain. AFTER USE  Rest and breathe slowly and easily.  It can be helpful to keep track of a log of your progress. Your  caregiver can provide you with a simple table to help with this. If you are using the spirometer at home, follow these instructions: [place] IF:   You are having difficultly using the spirometer.  You have trouble using the spirometer as often as instructed.  Your pain medication is not giving enough relief while using the spirometer.  You develop fever of 100.5 F (38.1 C) or higher. SEEK IMMEDIATE MEDICAL CARE IF:   You cough up bloody sputum that had not been present before.  You develop fever of 102 F (38.9 C) or greater.  You develop worsening pain at or near the incision site. MAKE SURE YOU:   Understand these instructions.  Will watch your condition.  Will get help right away if you are not doing well or get worse. Document Released: 05/06/2006 Document Revised: 03/18/2011 Document Reviewed: 07/07/2006 [employer] Patient Information 2014 [place], [place].   ________________________________________________________________________

## 2015-09-21 ENCOUNTER — Inpatient Hospital Stay (HOSPITAL_COMMUNITY): Payer: PPO | Admitting: Anesthesiology

## 2015-09-21 ENCOUNTER — Inpatient Hospital Stay (HOSPITAL_COMMUNITY): Payer: PPO

## 2015-09-21 ENCOUNTER — Encounter (HOSPITAL_COMMUNITY)
Admission: RE | Disposition: A | Discharge status: Home Health | Payer: Self-pay | Source: Ambulatory Visit | Attending: Specialist

## 2015-09-21 ENCOUNTER — Inpatient Hospital Stay (HOSPITAL_COMMUNITY)
Admission: RE | Admit: 2015-09-21 | Discharge: 2015-09-23 | DRG: 470 | Disposition: A | Discharge status: Home Health | Payer: PPO | Source: Ambulatory Visit | Attending: Specialist | Admitting: Specialist

## 2015-09-21 ENCOUNTER — Encounter (HOSPITAL_COMMUNITY): Payer: Self-pay | Admitting: Anesthesiology

## 2015-09-21 DIAGNOSIS — Z96652 Presence of left artificial knee joint: Secondary | ICD-10-CM | POA: Diagnosis not present

## 2015-09-21 DIAGNOSIS — I10 Essential (primary) hypertension: Secondary | ICD-10-CM | POA: Diagnosis not present

## 2015-09-21 DIAGNOSIS — M1712 Unilateral primary osteoarthritis, left knee: Secondary | ICD-10-CM | POA: Diagnosis not present

## 2015-09-21 DIAGNOSIS — Z96651 Presence of right artificial knee joint: Secondary | ICD-10-CM | POA: Diagnosis present

## 2015-09-21 DIAGNOSIS — E039 Hypothyroidism, unspecified: Secondary | ICD-10-CM | POA: Diagnosis present

## 2015-09-21 DIAGNOSIS — M25562 Pain in left knee: Secondary | ICD-10-CM | POA: Diagnosis not present

## 2015-09-21 DIAGNOSIS — Z96659 Presence of unspecified artificial knee joint: Secondary | ICD-10-CM

## 2015-09-21 DIAGNOSIS — Z9889 Other specified postprocedural states: Secondary | ICD-10-CM

## 2015-09-21 DIAGNOSIS — M179 Osteoarthritis of knee, unspecified: Secondary | ICD-10-CM | POA: Diagnosis not present

## 2015-09-21 DIAGNOSIS — Z471 Aftercare following joint replacement surgery: Secondary | ICD-10-CM | POA: Diagnosis not present

## 2015-09-21 HISTORY — PX: TOTAL KNEE ARTHROPLASTY: SHX125

## 2015-09-21 SURGERY — ARTHROPLASTY, KNEE, TOTAL
Anesthesia: Spinal | Site: Knee | Laterality: Left

## 2015-09-21 MED ORDER — ONDANSETRON HCL 4 MG/2ML IJ SOLN: 4.0000 mg | Freq: Four times a day (QID) | INTRAMUSCULAR | Status: DC | PRN

## 2015-09-21 MED ORDER — MAGNESIUM CITRATE PO SOLN: 1.0000 | Freq: Once | ORAL | Status: DC | PRN

## 2015-09-21 MED ORDER — POLYMYXIN B SULFATE 500000 UNITS IJ SOLR
INTRAMUSCULAR | Status: AC
  Filled 2015-09-21: qty 1

## 2015-09-21 MED ORDER — MIDAZOLAM HCL 2 MG/2ML IJ SOLN
INTRAMUSCULAR | Status: AC
Start: 2015-09-21 — End: 2015-09-21
  Filled 2015-09-21: qty 2

## 2015-09-21 MED ORDER — VITAMIN C 500 MG PO TABS
500.0000 mg | ORAL_TABLET | Freq: Every day | ORAL | Status: DC
  Administered 2015-09-21 – 2015-09-23 (×3): 500 mg via ORAL
  Filled 2015-09-21 (×3): qty 1

## 2015-09-21 MED ORDER — ROCURONIUM BROMIDE 10 MG/ML (PF) SYRINGE
PREFILLED_SYRINGE | INTRAVENOUS | Status: AC
  Filled 2015-09-21: qty 10

## 2015-09-21 MED ORDER — SODIUM CHLORIDE 0.9 % IV SOLN
1000.0000 mg | INTRAVENOUS | Status: AC
  Administered 2015-09-21: 1000 mg via INTRAVENOUS
  Filled 2015-09-21: qty 1100

## 2015-09-21 MED ORDER — METOCLOPRAMIDE HCL 5 MG PO TABS
5.0000 mg | ORAL_TABLET | Freq: Three times a day (TID) | ORAL | Status: DC | PRN
Start: 2015-09-21 — End: 2015-09-23

## 2015-09-21 MED ORDER — NALOXEGOL OXALATE 12.5 MG PO TABS
12.5000 mg | ORAL_TABLET | Freq: Every day | ORAL | Status: DC
  Administered 2015-09-22 – 2015-09-23 (×2): 12.5 mg via ORAL
  Filled 2015-09-21 (×3): qty 1

## 2015-09-21 MED ORDER — METOCLOPRAMIDE HCL 5 MG/ML IJ SOLN: 5.0000 mg | Freq: Three times a day (TID) | INTRAMUSCULAR | Status: DC | PRN

## 2015-09-21 MED ORDER — DOCUSATE SODIUM 100 MG PO CAPS
100.0000 mg | ORAL_CAPSULE | Freq: Two times a day (BID) | ORAL | Status: DC
  Administered 2015-09-21 – 2015-09-23 (×4): 100 mg via ORAL
  Filled 2015-09-21 (×4): qty 1

## 2015-09-21 MED ORDER — PHENOL 1.4 % MT LIQD
1.0000 | OROMUCOSAL | Status: DC | PRN
  Filled 2015-09-21: qty 177

## 2015-09-21 MED ORDER — ACETAMINOPHEN 650 MG RE SUPP: 650.0000 mg | Freq: Four times a day (QID) | RECTAL | Status: DC | PRN

## 2015-09-21 MED ORDER — MIDAZOLAM HCL 5 MG/5ML IJ SOLN
INTRAMUSCULAR | Status: DC | PRN
  Administered 2015-09-21 (×2): 1 mg via INTRAVENOUS

## 2015-09-21 MED ORDER — BUPIVACAINE-EPINEPHRINE 0.5% -1:200000 IJ SOLN
INTRAMUSCULAR | Status: DC | PRN
  Administered 2015-09-21: 50 mL

## 2015-09-21 MED ORDER — POLYETHYLENE GLYCOL 3350 17 G PO PACK: 17.0000 g | PACK | Freq: Every day | ORAL | Status: DC | PRN

## 2015-09-21 MED ORDER — ALUM & MAG HYDROXIDE-SIMETH 200-200-20 MG/5ML PO SUSP: 30.0000 mL | ORAL | Status: DC | PRN

## 2015-09-21 MED ORDER — LIDOCAINE 2% (20 MG/ML) 5 ML SYRINGE
INTRAMUSCULAR | Status: AC
  Filled 2015-09-21: qty 5

## 2015-09-21 MED ORDER — BUPIVACAINE-EPINEPHRINE 0.5% -1:200000 IJ SOLN
INTRAMUSCULAR | Status: AC
Start: 2015-09-21 — End: 2015-09-21
  Filled 2015-09-21: qty 1

## 2015-09-21 MED ORDER — LACTATED RINGERS IV SOLN
INTRAVENOUS | Status: DC
  Administered 2015-09-21 (×2): via INTRAVENOUS

## 2015-09-21 MED ORDER — LEVOTHYROXINE SODIUM 88 MCG PO TABS
88.0000 ug | ORAL_TABLET | Freq: Every day | ORAL | Status: DC
Start: 2015-09-22 — End: 2015-09-23
  Administered 2015-09-22 – 2015-09-23 (×3): 88 ug via ORAL
  Filled 2015-09-21 (×2): qty 1

## 2015-09-21 MED ORDER — CEFAZOLIN SODIUM-DEXTROSE 2-4 GM/100ML-% IV SOLN
INTRAVENOUS | Status: AC
  Filled 2015-09-21: qty 100

## 2015-09-21 MED ORDER — POLYVINYL ALCOHOL 1.4 % OP SOLN
1.0000 [drp] | Freq: Two times a day (BID) | OPHTHALMIC | Status: DC
  Administered 2015-09-21 – 2015-09-23 (×4): 1 [drp] via OPHTHALMIC
  Filled 2015-09-21 (×2): qty 15

## 2015-09-21 MED ORDER — ACETAMINOPHEN 325 MG PO TABS
650.0000 mg | ORAL_TABLET | Freq: Four times a day (QID) | ORAL | Status: DC | PRN
  Administered 2015-09-22 – 2015-09-23 (×2): 650 mg via ORAL
  Filled 2015-09-21 (×2): qty 2

## 2015-09-21 MED ORDER — CEFAZOLIN SODIUM-DEXTROSE 2-4 GM/100ML-% IV SOLN
2.0000 g | Freq: Four times a day (QID) | INTRAVENOUS | Status: AC
  Administered 2015-09-21 (×2): 2 g via INTRAVENOUS
  Filled 2015-09-21: qty 100

## 2015-09-21 MED ORDER — BISACODYL 5 MG PO TBEC: 5.0000 mg | DELAYED_RELEASE_TABLET | Freq: Every day | ORAL | Status: DC | PRN

## 2015-09-21 MED ORDER — OXYCODONE-ACETAMINOPHEN 5-325 MG PO TABS: 1.0000 | ORAL_TABLET | ORAL | 0 refills | Status: DC | PRN

## 2015-09-21 MED ORDER — BUPIVACAINE HCL (PF) 0.5 % IJ SOLN
INTRAMUSCULAR | Status: AC
Start: 2015-09-21 — End: 2015-09-21
  Filled 2015-09-21: qty 30

## 2015-09-21 MED ORDER — BUPIVACAINE HCL (PF) 0.25 % IJ SOLN
INTRAMUSCULAR | Status: AC
Start: 2015-09-21 — End: 2015-09-21
  Filled 2015-09-21: qty 30

## 2015-09-21 MED ORDER — PROPOFOL 500 MG/50ML IV EMUL
INTRAVENOUS | Status: DC | PRN
  Administered 2015-09-21: 50 ug/kg/min via INTRAVENOUS

## 2015-09-21 MED ORDER — METHOCARBAMOL 500 MG PO TABS
500.0000 mg | ORAL_TABLET | Freq: Four times a day (QID) | ORAL | Status: DC | PRN
  Administered 2015-09-22 – 2015-09-23 (×5): 500 mg via ORAL
  Filled 2015-09-21 (×5): qty 1

## 2015-09-21 MED ORDER — ONDANSETRON HCL 4 MG/2ML IJ SOLN
INTRAMUSCULAR | Status: AC
Start: 2015-09-21 — End: 2015-09-21
  Filled 2015-09-21: qty 2

## 2015-09-21 MED ORDER — PROPOFOL 10 MG/ML IV BOLUS
INTRAVENOUS | Status: AC
  Filled 2015-09-21: qty 40

## 2015-09-21 MED ORDER — PROPOFOL 10 MG/ML IV BOLUS
INTRAVENOUS | Status: AC
  Filled 2015-09-21: qty 20

## 2015-09-21 MED ORDER — CEFAZOLIN SODIUM-DEXTROSE 2-4 GM/100ML-% IV SOLN
2.0000 g | INTRAVENOUS | Status: AC
  Administered 2015-09-21: 2 g via INTRAVENOUS

## 2015-09-21 MED ORDER — METHOCARBAMOL 1000 MG/10ML IJ SOLN
500.0000 mg | Freq: Four times a day (QID) | INTRAVENOUS | Status: DC | PRN
  Administered 2015-09-21: 500 mg via INTRAVENOUS
  Filled 2015-09-21: qty 550
  Filled 2015-09-21: qty 5

## 2015-09-21 MED ORDER — ONDANSETRON HCL 4 MG/2ML IJ SOLN
INTRAMUSCULAR | Status: DC | PRN
  Administered 2015-09-21: 4 mg via INTRAVENOUS

## 2015-09-21 MED ORDER — DOCUSATE SODIUM 100 MG PO CAPS: 100.0000 mg | ORAL_CAPSULE | Freq: Two times a day (BID) | ORAL | 1 refills | Status: DC | PRN

## 2015-09-21 MED ORDER — FENTANYL CITRATE (PF) 100 MCG/2ML IJ SOLN
INTRAMUSCULAR | Status: DC | PRN
  Administered 2015-09-21: 50 ug via INTRAVENOUS
  Administered 2015-09-21 (×2): 25 ug via INTRAVENOUS

## 2015-09-21 MED ORDER — FENTANYL CITRATE (PF) 100 MCG/2ML IJ SOLN: 25.0000 ug | INTRAMUSCULAR | Status: DC | PRN

## 2015-09-21 MED ORDER — SODIUM CHLORIDE 0.9 % IR SOLN
Status: DC | PRN
  Administered 2015-09-21: 2000 mL

## 2015-09-21 MED ORDER — DEXAMETHASONE SODIUM PHOSPHATE 10 MG/ML IJ SOLN
INTRAMUSCULAR | Status: DC | PRN
  Administered 2015-09-21: 10 mg via INTRAVENOUS

## 2015-09-21 MED ORDER — KCL IN DEXTROSE-NACL 20-5-0.45 MEQ/L-%-% IV SOLN
INTRAVENOUS | Status: AC
  Administered 2015-09-21 – 2015-09-22 (×2): via INTRAVENOUS
  Filled 2015-09-21 (×2): qty 1000

## 2015-09-21 MED ORDER — ASPIRIN EC 325 MG PO TBEC: 325.0000 mg | DELAYED_RELEASE_TABLET | Freq: Two times a day (BID) | ORAL | 1 refills | Status: DC

## 2015-09-21 MED ORDER — POLYETHYLENE GLYCOL 3350 17 G PO PACK: 17.0000 g | PACK | Freq: Every day | ORAL | 0 refills | Status: DC

## 2015-09-21 MED ORDER — ARTIFICIAL TEARS OP OINT
TOPICAL_OINTMENT | Freq: Two times a day (BID) | OPHTHALMIC | Status: DC
  Filled 2015-09-21: qty 3.5

## 2015-09-21 MED ORDER — HYDROMORPHONE HCL 1 MG/ML IJ SOLN
0.5000 mg | INTRAMUSCULAR | Status: DC | PRN
  Administered 2015-09-21 – 2015-09-22 (×5): 0.5 mg via INTRAVENOUS
  Filled 2015-09-21 (×5): qty 1

## 2015-09-21 MED ORDER — OXYCODONE HCL 5 MG PO TABS
5.0000 mg | ORAL_TABLET | ORAL | Status: DC | PRN
  Administered 2015-09-21: 10 mg via ORAL
  Administered 2015-09-21 (×2): 5 mg via ORAL
  Administered 2015-09-22 – 2015-09-23 (×11): 10 mg via ORAL
  Filled 2015-09-21 (×10): qty 2
  Filled 2015-09-21: qty 1
  Filled 2015-09-21 (×3): qty 2

## 2015-09-21 MED ORDER — LACTATED RINGERS IV SOLN: INTRAVENOUS | Status: DC

## 2015-09-21 MED ORDER — BUPIVACAINE HCL (PF) 0.75 % IJ SOLN
INTRAMUSCULAR | Status: DC | PRN
  Administered 2015-09-21: 1.6 mL via INTRATHECAL

## 2015-09-21 MED ORDER — DEXAMETHASONE SODIUM PHOSPHATE 10 MG/ML IJ SOLN
INTRAMUSCULAR | Status: AC
Start: 2015-09-21 — End: 2015-09-21
  Filled 2015-09-21: qty 1

## 2015-09-21 MED ORDER — SODIUM CHLORIDE 0.9 % IR SOLN
Status: DC | PRN
  Administered 2015-09-21: 500 mL

## 2015-09-21 MED ORDER — ASPIRIN EC 325 MG PO TBEC
325.0000 mg | DELAYED_RELEASE_TABLET | Freq: Two times a day (BID) | ORAL | Status: DC
  Administered 2015-09-22 – 2015-09-23 (×3): 325 mg via ORAL
  Filled 2015-09-21 (×3): qty 1

## 2015-09-21 MED ORDER — ONDANSETRON HCL 4 MG PO TABS
4.0000 mg | ORAL_TABLET | Freq: Four times a day (QID) | ORAL | Status: DC | PRN
  Administered 2015-09-23: 4 mg via ORAL
  Filled 2015-09-21: qty 1

## 2015-09-21 MED ORDER — MENTHOL 3 MG MT LOZG: 1.0000 | LOZENGE | OROMUCOSAL | Status: DC | PRN

## 2015-09-21 MED ORDER — FENTANYL CITRATE (PF) 100 MCG/2ML IJ SOLN
INTRAMUSCULAR | Status: AC
  Filled 2015-09-21: qty 2

## 2015-09-21 MED ORDER — DIPHENHYDRAMINE HCL 12.5 MG/5ML PO ELIX: 12.5000 mg | ORAL_SOLUTION | ORAL | Status: DC | PRN

## 2015-09-21 MED ORDER — PROMETHAZINE HCL 25 MG/ML IJ SOLN: 6.2500 mg | INTRAMUSCULAR | Status: DC | PRN

## 2015-09-21 SURGICAL SUPPLY — 58 items
BAG ZIPLOCK 12X15 (MISCELLANEOUS) IMPLANT
BANDAGE ACE 4X5 VEL STRL LF (GAUZE/BANDAGES/DRESSINGS) ×2 IMPLANT
BANDAGE ACE 6X5 VEL STRL LF (GAUZE/BANDAGES/DRESSINGS) ×2 IMPLANT
BLADE SAG 18X100X1.27 (BLADE) ×2 IMPLANT
BLADE SAW SGTL 13.0X1.19X90.0M (BLADE) ×2 IMPLANT
CAPT KNEE TOTAL 3 ATTUNE ×2 IMPLANT
CEMENT HV SMART SET (Cement) ×4 IMPLANT
CLOTH 2% CHLOROHEXIDINE 3PK (PERSONAL CARE ITEMS) ×2 IMPLANT
CUFF TOURN SGL QUICK 34 (TOURNIQUET CUFF) ×1
CUFF TRNQT CYL 34X4X40X1 (TOURNIQUET CUFF) ×1 IMPLANT
DECANTER SPIKE VIAL GLASS SM (MISCELLANEOUS) ×2 IMPLANT
DRAPE INCISE IOBAN 66X45 STRL (DRAPES) IMPLANT
DRAPE ORTHO SPLIT 77X108 STRL (DRAPES) ×2
DRAPE SHEET LG 3/4 BI-LAMINATE (DRAPES) ×2 IMPLANT
DRAPE SURG ORHT 6 SPLT 77X108 (DRAPES) ×2 IMPLANT
DRAPE U-SHAPE 47X51 STRL (DRAPES) ×2 IMPLANT
DRSG AQUACEL AG ADV 3.5X10 (GAUZE/BANDAGES/DRESSINGS) ×2 IMPLANT
DRSG TEGADERM 4X4.75 (GAUZE/BANDAGES/DRESSINGS) IMPLANT
DURAPREP 26ML APPLICATOR (WOUND CARE) ×2 IMPLANT
ELECT REM PT RETURN 9FT ADLT (ELECTROSURGICAL) ×2
ELECTRODE REM PT RTRN 9FT ADLT (ELECTROSURGICAL) ×1 IMPLANT
EVACUATOR 1/8 PVC DRAIN (DRAIN) IMPLANT
GAUZE SPONGE 2X2 8PLY STRL LF (GAUZE/BANDAGES/DRESSINGS) IMPLANT
GLOVE BIOGEL PI IND STRL 7.0 (GLOVE) IMPLANT
GLOVE BIOGEL PI IND STRL 8 (GLOVE) ×1 IMPLANT
GLOVE BIOGEL PI INDICATOR 7.0 (GLOVE)
GLOVE BIOGEL PI INDICATOR 8 (GLOVE) ×1
GLOVE SURG SS PI 7.0 STRL IVOR (GLOVE) IMPLANT
GLOVE SURG SS PI 7.5 STRL IVOR (GLOVE) IMPLANT
GLOVE SURG SS PI 8.0 STRL IVOR (GLOVE) ×4 IMPLANT
GOWN STRL REUS W/TWL XL LVL3 (GOWN DISPOSABLE) ×4 IMPLANT
HANDPIECE INTERPULSE COAX TIP (DISPOSABLE) ×1
HEMOSTAT SPONGE AVITENE ULTRA (HEMOSTASIS) IMPLANT
IMMOBILIZER KNEE 20 (SOFTGOODS) ×2
IMMOBILIZER KNEE 20 THIGH 36 (SOFTGOODS) ×1 IMPLANT
MANIFOLD NEPTUNE II (INSTRUMENTS) ×2 IMPLANT
NS IRRIG 1000ML POUR BTL (IV SOLUTION) IMPLANT
PACK TOTAL KNEE CUSTOM (KITS) ×2 IMPLANT
POSITIONER SURGICAL ARM (MISCELLANEOUS) ×2 IMPLANT
SET HNDPC FAN SPRY TIP SCT (DISPOSABLE) ×1 IMPLANT
SPONGE GAUZE 2X2 STER 10/PKG (GAUZE/BANDAGES/DRESSINGS)
SPONGE SURGIFOAM ABS GEL 100 (HEMOSTASIS) ×2 IMPLANT
STAPLER VISISTAT (STAPLE) IMPLANT
STRIP CLOSURE SKIN 1/2X4 (GAUZE/BANDAGES/DRESSINGS) ×4 IMPLANT
SUT BONE WAX W31G (SUTURE) ×2 IMPLANT
SUT MNCRL AB 4-0 PS2 18 (SUTURE) IMPLANT
SUT STRATAFIX 0 PDS 27 VIOLET (SUTURE) ×2
SUT VIC AB 1 CT1 27 (SUTURE) ×2
SUT VIC AB 1 CT1 27XBRD ANTBC (SUTURE) ×2 IMPLANT
SUT VIC AB 2-0 CT1 27 (SUTURE) ×3
SUT VIC AB 2-0 CT1 TAPERPNT 27 (SUTURE) ×3 IMPLANT
SUTURE STRATFX 0 PDS 27 VIOLET (SUTURE) ×1 IMPLANT
SYR 50ML LL SCALE MARK (SYRINGE) ×2 IMPLANT
TOWER CARTRIDGE SMART MIX (DISPOSABLE) ×2 IMPLANT
TRAY FOLEY W/METER SILVER 16FR (SET/KITS/TRAYS/PACK) ×2 IMPLANT
WATER STERILE IRR 1500ML POUR (IV SOLUTION) ×2 IMPLANT
WRAP KNEE MAXI GEL POST OP (GAUZE/BANDAGES/DRESSINGS) ×2 IMPLANT
YANKAUER SUCT BULB TIP 10FT TU (MISCELLANEOUS) ×2 IMPLANT

## 2015-09-21 NOTE — Transfer of Care (Signed)
Immediate Anesthesia Transfer of Care Note  Patient: [name]  Procedure(s) Performed: Procedure(s): LEFT TOTAL KNEE ARTHROPLASTY (Left)  Patient Location: PACU  Anesthesia Type:Spinal  Level of Consciousness: awake, alert  and oriented  Airway & Oxygen Therapy: Patient Spontanous Breathing and Patient connected to face mask oxygen  Post-op Assessment: Report given to RN and Post -op Vital signs reviewed and stable  Post vital signs: Reviewed and stable  Last Vitals:  Vitals:   09/21/15 0744  BP: (!) 153/55  Pulse: 97  Resp: 16  Temp: 37 C    Last Pain:  Vitals:   09/21/15 0744  TempSrc: Oral         Complications: No apparent anesthesia complications

## 2015-09-21 NOTE — Anesthesia Postprocedure Evaluation (Signed)
Anesthesia Post Note  Patient: [name]  Procedure(s) Performed: Procedure(s) (LRB): LEFT TOTAL KNEE ARTHROPLASTY (Left)  Patient location during evaluation: PACU Anesthesia Type: Spinal Level of consciousness: oriented and awake and alert Pain management: pain level controlled Vital Signs Assessment: post-procedure vital signs reviewed and stable Respiratory status: spontaneous breathing, respiratory function stable and patient connected to nasal cannula oxygen Cardiovascular status: blood pressure returned to baseline and stable Postop Assessment: no headache, no backache, patient able to bend at knees and spinal receding Anesthetic complications: no    Last Vitals:  Vitals:   09/21/15 1437 09/21/15 1534  BP: (!) 145/68 (!) 177/79  Pulse: 90 97  Resp: 17 16  Temp: 36.8 C 36.8 C    Last Pain:  Vitals:   09/21/15 1534  TempSrc: Oral  PainSc:                  [name] J

## 2015-09-21 NOTE — Op Note (Signed)
[name], [name]            ACCOUNT NO.:  [id number]  MEDICAL RECORD NO.:  [id number]  LOCATION:  [facility]                         FACILITY:  [facility]  PHYSICIAN:  [name], M.D.    DATE OF BIRTH:  05/20/1933  DATE OF PROCEDURE:  09/21/2015 DATE OF DISCHARGE:                              OPERATIVE REPORT   PREOPERATIVE DIAGNOSIS:  End-stage osteoarthrosis of the left knee.  POSTOPERATIVE DIAGNOSIS:  End-stage osteoarthrosis of the left knee.  PROCEDURE PERFORMED:  Left total knee arthroplasty.  Components; DePuy rotating platform, 4 femur, 4 tibia, 5 insert, 35 patella.  ANESTHESIA:  General.  ASSISTANT:  Jasmine Mayer, PA.  HISTORY:  A 81, bone-on-bone arthrosis, medial compartment of left knee, indicated for replacement of the degenerated knee refractory to conservative treatment including injections, viscosupplementation, utilization of cane, exercise program, she had bone-on-bone arthrosis, medial compartment.  Risks and benefits were discussed including bleeding, infection, damage to the neurovascular structures, suboptimal range of motion, DVT, PE, anesthetic complications, etc.  TECHNIQUE:  With the patient in supine position, after induction of adequate anesthesia, 2 g of Kefzol, left lower extremity was prepped and draped, and exsanguinated in usual sterile fashion.  Thigh tourniquet was inflated to 275 mmHg.  Midline incision was then made over the knee. Full-thickness flaps developed.  Median parapatellar arthrotomy performed.  Patella everted and knee flexed.  Copious portion of clear synovial fluid and evacuated.  Tricompartmental osteoarthrosis was noted particularly at the medial compartment.  Removed remnants of medial and lateral menisci and of the ACL.  Elevated the soft tissues medially preserving the MCL.  Step drill utilized to enter the femoral canal, was irrigated with 5 degrees left, 10 off the distal femur, due to this, flexion contracture was  used, this was then pinned.  We performed a distal femoral cut.  We then used our external alignment guide, bisecting the tibiotalar joint, 2 from the defect, which was medial, parallel to the shaft, 3 degrees of slope, pinned, performed our cut. Also soft tissues protected throughout the case posteriorly.  After this cut was performed, we trialed our extension gap, which was straight at 5 mm of insert.  The knee was then reflexed, sized our femur to 4 off the anterior cortex, pinned, and 3 degrees of external rotation.  We performed a distal femoral cut.  We performed our anterior, posterior and chamfer cuts, then our box cut bisecting the canal.  Again, this had sized to a 4.  We protected the soft tissues at all times.  Then, turned our attention towards completing the tibia, it was sized to a 4, distal medial aspect of tibial tubercle, external alignment guide, bisecting the tibiotalar joint, pinned and centrally drilled, punch guide used. We placed our trial femur, drilled our lug holes and placed a 5-mm insert, reduced it.  We had full extension, full flexion, good stability, varus and valgus stressing 0-30 degrees, negative anterior drawer.  I turned our attention to the patella.  It was everted, measured to a 23, planed to a 14 with external alignment guide used and the patellar jig.  We performed this cut with an oscillating saw, trialed a 35, medializing her peg holes parallel to the  joint line, drilled our holes, placed a trial patella, reduced it, and excellent patellofemoral tracking.  We selected therefore 35, all instrumentation was removed.  Checked posteriorly, cauterized the geniculates.  No residual PCL or menisci were noted.  Copiously irrigated with pulsatile lavage.  Knee flexed, all surfaces were well dried.  Subluxed the tibia, mixed the cement on back table in appropriate fashion and injected it into the tibia under pressure, digitally pressurized it.  Impacted our  4 tibial tray, redundant cement removed.  We cemented and impacted the femur, redundant cement removed.  Placed a trial 13, reduced it and held an axial load throughout the curing of the case, redundant cement removed.  We cemented and clamped the patella.  After hardening of the cement, we released the tourniquet at 55 minutes.  Cauterized any bleeding.  Minimal bleeding was noted.  0.25% Marcaine with epinephrine was infiltrated in the periarticular tissues and the periosteum.  Bone wax on cancellous surfaces.  Excellent patellofemoral tracking was noted, full extension and full flexion.  We removed the trial insert, checked posteriorly and meticulously removed all redundant cement. Placed a permanent 5 insert, reduced it, had excellent patellofemoral tracking, good stability, varus and valgus stressing 0-30 degrees and negative anterior drawer.  Copiously irrigated with pulsatile lavage. Gentle flexion, reapproximated the patellar arthrotomy with 1 Vicryl, subcu with 0 and 2-0 Vicryl, skin with subcuticular Prolene.  Sterile dressing applied.  Placed an immobilizer, extubated without difficulty, and transported to the recovery room in satisfactory condition.  She had flexion to gravity at 90 degrees.  The patient tolerated the procedure well.  No complications.  Blood loss was minimal.  Tourniquet time 58 minutes.  Assistant, Jasmine MayerBlair Hoffman.     Jasmine EveryJeffrey Quyen Hoffman, M.D.     Jasmine PenJB/MEDQ  D:  09/21/2015  T:  09/21/2015  Job:  [id number]

## 2015-09-21 NOTE — Progress Notes (Signed)
X-ray results noted 

## 2015-09-21 NOTE — Progress Notes (Signed)
Portable AP and Lateral Left Knee X-rays done. 

## 2015-09-21 NOTE — Anesthesia Procedure Notes (Signed)
Spinal  Start time: 09/21/2015 9:41 AM End time: 09/21/2015 9:43 AM Staffing Resident/CRNA: Harle Stanford R Performed: resident/CRNA  Preanesthetic Checklist Completed: patient identified, site marked, surgical consent, pre-op evaluation, timeout performed, IV checked, risks and benefits discussed and monitors and equipment checked Spinal Block Patient position: sitting Prep: Betadine Approach: midline Location: L3-4 Injection technique: single-shot Needle Needle type: Spinocan  Needle gauge: 22 G Needle length: 9 cm Needle insertion depth: 7 cm Assessment Sensory level: T6 Additional Notes Timeout performed. SAB kit date checked. SAB without difficulty

## 2015-09-21 NOTE — Anesthesia Preprocedure Evaluation (Signed)
Anesthesia Evaluation  Patient identified by MRN, date of birth, ID band Patient awake    Reviewed: Allergy & Precautions, NPO status , Patient's Chart, lab work & pertinent test results  Airway Mallampati: II  TM Distance: >3 FB Neck ROM: Full    Dental no notable dental hx.    Pulmonary neg pulmonary ROS,    Pulmonary exam normal breath sounds clear to auscultation       Cardiovascular hypertension, negative cardio ROS Normal cardiovascular exam Rhythm:Regular Rate:Normal     Neuro/Psych negative neurological ROS  negative psych ROS   GI/Hepatic negative GI ROS, Neg liver ROS,   Endo/Other  negative endocrine ROS  Renal/GU negative Renal ROS  negative genitourinary   Musculoskeletal negative musculoskeletal ROS (+)   Abdominal   Peds negative pediatric ROS (+)  Hematology negative hematology ROS (+)   Anesthesia Other Findings   Reproductive/Obstetrics negative OB ROS                             Anesthesia Physical Anesthesia Plan  ASA: II  Anesthesia Plan: Spinal   Post-op Pain Management:    Induction: Intravenous  Airway Management Planned: Natural Airway  Additional Equipment:   Intra-op Plan:   Post-operative Plan:   Informed Consent: I have reviewed the patients History and Physical, chart, labs and discussed the procedure including the risks, benefits and alternatives for the proposed anesthesia with the patient or authorized representative who has indicated his/her understanding and acceptance.   Dental advisory given  Plan Discussed with: CRNA  Anesthesia Plan Comments: (Discussed risks and benefits of and differences between spinal and general. Discussed risks of spinal including headache, backache, failure, bleeding and hematoma, infection, and nerve damage. Patient consents to spinal. Questions answered. Coagulation studies and platelet count acceptable.)         Anesthesia Quick Evaluation

## 2015-09-21 NOTE — Evaluation (Signed)
Physical Therapy Evaluation Patient Details Name: [name] MRN: [id number] DOB: [birth year] Today's Date: 09/21/2015   History of Present Illness  s/p L TKR with hx of R TKR 1/17  Clinical Impression  Pt s/p L TKR presents with decreased L LE strength/ROM and post op pain limiting functional mobility.  Pt should progress to dc home with assist of family and HHPT follow up.    Follow Up Recommendations Home health PT    Equipment Recommendations  None recommended by PT    Recommendations for Other Services OT consult     Precautions / Restrictions Precautions Precautions: Fall;Knee Required Braces or Orthoses: Knee Immobilizer - Left Knee Immobilizer - Left: Discontinue once straight leg raise with < 10 degree lag Restrictions Weight Bearing Restrictions: No Other Position/Activity Restrictions: WBAT      Mobility  Bed Mobility Overal bed mobility: Needs Assistance Bed Mobility: Supine to Sit     Supine to sit: Min assist;Mod assist     General bed mobility comments: cues for sequence and use of R LE to self assist  Transfers Overall transfer level: Needs assistance Equipment used: Rolling walker (2 wheeled) Transfers: Sit to/from Stand Sit to Stand: Min assist         General transfer comment: cues for LE management and use of UEs to self assist  Ambulation/Gait Ambulation/Gait assistance: Min assist Ambulation Distance (Feet): 18 Feet Assistive device: Rolling walker (2 wheeled) Gait Pattern/deviations: Step-to pattern;Decreased step length - right;Decreased step length - left;Shuffle;Trunk flexed Gait velocity: decr Gait velocity interpretation: Below normal speed for age/gender General Gait Details: cues for sequence, posture and position from [employer]            Wheelchair Mobility    Modified Rankin (Stroke Patients Only)       Balance                                             Pertinent Vitals/Pain  Pain Assessment: 0-10 Pain Score: 5  Pain Location: L knee Pain Descriptors / Indicators: Aching;Sore Pain Intervention(s): Limited activity within patient's tolerance;Monitored during session;Patient requesting pain meds-RN notified;Ice applied;Premedicated before session    Home Living Family/patient expects to be discharged to:: Private residence Living Arrangements: Alone Available Help at Discharge: Family Type of Home: House Home Access: Stairs to enter Entrance Stairs-Rails: None Entrance Stairs-Number of Steps: 3 Home Layout: Able to live on main level with bedroom/bathroom Home Equipment: Walker - 2 wheels;Cane - single point      Prior Function Level of Independence: Independent               Hand Dominance        Extremity/Trunk Assessment   Upper Extremity Assessment: Overall WFL for tasks assessed           Lower Extremity Assessment: LLE deficits/detail         Communication   Communication: No difficulties  Cognition Arousal/Alertness: Awake/alert Behavior During Therapy: WFL for tasks assessed/performed Overall Cognitive Status: Within Functional Limits for tasks assessed                      General Comments      Exercises Total Joint Exercises Ankle Circles/Pumps: AROM;Both;15 reps;Supine      Assessment/Plan    PT Assessment Patient needs continued PT services  PT Diagnosis Difficulty walking  PT Problem List Decreased strength;Decreased range of motion;Decreased activity tolerance;Decreased mobility;Decreased knowledge of use of DME;Pain  PT Treatment Interventions DME instruction;Gait training;Stair training;Functional mobility training;Therapeutic activities;Therapeutic exercise;Patient/family education   PT Goals (Current goals can be found in the Care Plan section) Acute Rehab PT Goals Patient Stated Goal: Regain IND PT Goal Formulation: With patient Time For Goal Achievement: 09/25/15 Potential to Achieve Goals:  Good    Frequency 7X/week   Barriers to discharge        Co-evaluation               End of Session Equipment Utilized During Treatment: Gait belt;Left knee immobilizer Activity Tolerance: Patient tolerated treatment well Patient left: in chair;with call bell/phone within reach;with family/visitor present;with chair alarm set Nurse Communication: Mobility status         Time: [id number] PT Time Calculation (min) (ACUTE ONLY): 31 min   Charges:   PT Evaluation $PT Eval Low Complexity: 1 Procedure PT Treatments $Gait Training: 8-22 mins   PT G Codes:        Tomasa Dobransky 09/21/2015, 5:42 PM

## 2015-09-21 NOTE — Interval H&P Note (Signed)
History and Physical Interval Note:  09/21/2015 11:30 AM  [name]  has presented today for surgery, with the diagnosis of DJD Left Knee  The various methods of treatment have been discussed with the patient and family. After consideration of risks, benefits and other options for treatment, the patient has consented to  Procedure(s): LEFT TOTAL KNEE ARTHROPLASTY (Left) as a surgical intervention .  The patient's history has been reviewed, patient examined, no change in status, stable for surgery.  I have reviewed the patient's chart and labs.  Questions were answered to the patient's satisfaction.     Meenakshi Sazama C

## 2015-09-21 NOTE — Brief Op Note (Signed)
09/21/2015  11:30 AM  PATIENT:  Jasmine PiggMarian T Hoffman  82 y.o. female  PRE-OPERATIVE DIAGNOSIS:  DJD Left Knee  POST-OPERATIVE DIAGNOSIS:  DJD Left Knee  PROCEDURE:  Procedure(s): LEFT TOTAL KNEE ARTHROPLASTY (Left)  SURGEON:  Surgeon(s) and Role:    * Jene EveryJeffrey Sholonda Jobst, MD - Primary  PHYSICIAN ASSISTANT:   ASSISTANTSSu Hilt: Roberts   ANESTHESIA:   general  EBL:  Total I/O In: 1000 [I.V.:1000] Out: 330 [Urine:300; Blood:30]  BLOOD ADMINISTERED:none  DRAINS: none   LOCAL MEDICATIONS USED:  MARCAINE     SPECIMEN:  No Specimen  DISPOSITION OF SPECIMEN:  N/A  COUNTS:  YES  TOURNIQUET:   Total Tourniquet Time Documented: Thigh (Left) - 58 minutes Total: Thigh (Left) - 58 minutes   DICTATION: .Other Dictation: Dictation Number [id number]  PLAN OF CARE: Admit to inpatient   PATIENT DISPOSITION:  PACU - hemodynamically stable.   Delay start of Pharmacological VTE agent (>24hrs) due to surgical blood loss or risk of bleeding: no

## 2015-09-21 NOTE — H&P (View-Only) (Signed)
[name] DOB: 05/07/1933 Widowed / Language: English / Race: White Female  H&P Date: 09/15/15  Chief Complaint: Left knee pain  History of Present Illness The patient is a 81 year old female who comes in today for a preoperative History and Physical. The patient is scheduled for a left total knee arthroplasty to be performed by Dr. Jeffrey C. Beane, MD at [facility] on 09/21/15. Dr. Beane and the patient mutually agreed to proceed with a total knee replacement. Risks and benefits of the procedure were discussed including stiffness, suboptimal range of motion, persistent pain, infection requiring removal of prosthesis and reinsertion, need for prophylactic antibiotics in the future, for example, dental procedures, possible need for manipulation, revision in the future and also anesthetic complications including DVT, PE, etc. We discussed the perioperative course, time in the hospital, postoperative recovery and the need for elevation to control swelling. We also discussed the predicted range of motion and the probability that squatting and kneeling would be unobtainable in the future. In addition, postoperative anticoagulation was discussed. We have obtained preoperative medical clearance as necessary. Provided her illustrated handout and discussed it in detail. They will enroll in the total joint replacement educational forum at the hospital. Her daughter is here with her today. Last time she went to [place] and had issues with swelling and the leg not remaining elevated. She would like to D/C to home this time with HHPT then start outpt PT here post-op with Matt. She was nauseous with some vomiting in the hospital for her R TKA but does not remember much of her hospital stay. She does recall excess constipation at [place] even requiring an enema. She has dulcolax at home if needed still. She has a walker and will be getting a cane. She will be borrowing a commode and shower  seat.  Problem List/Past Medical Hx Spinal stenosis, lumbar (M48.06)  Hypercholesterolemia  Gastroesophageal Reflux Disease  Hypothyroidism  Valve Disorder (one valve clicks)  Osteoarthritis  Hemorrhoids    Allergies Ampicillin Sodium *PENICILLINS*  Allergies Reconciled   Family History  Heart Disease  Father. Hypertension  Mother. Rheumatoid Arthritis  Sister. Cancer  Brother, Sister. Cerebrovascular Accident  Mother. Congestive Heart Failure  Father. First Degree Relatives   Social History Tobacco use  Never smoker. 10/19/2014 Children  2 Current work status  retired Never consumed alcohol  10/19/2014: Never consumed alcohol Exercise  Exercises daily; does other Living situation  live alone, one story home Marital status  widowed No history of drug/alcohol rehab  Not under pain contract  Number of flights of stairs before winded  2-3 Tobacco / smoke exposure  10/19/2014: no Post-Surgical Plans  home with HHPT, daughter will stay with her  Medication History Synthroid (88MCG Tablet, Oral) Active. Medications Reconciled  Pregnancy / Birth History  Pregnant  no  Past Surgical History  Gallbladder Surgery  laporoscopic 1993 Tubal Ligation  Foot Surgery - Right  bunionectomy, 2014 Total Knee Replacement - Right   Review of Systems General Not Present- Chills, Fatigue, Fever, Memory Loss, Night Sweats, Weight Gain and Weight Loss. Skin Not Present- Eczema, Hives, Itching, Lesions and Rash. HEENT Not Present- Dentures, Double Vision, Headache, Hearing Loss, Tinnitus and Visual Loss. Respiratory Not Present- Allergies, Chronic Cough, Coughing up blood, Shortness of breath at rest and Shortness of breath with exertion. Cardiovascular Not Present- Chest Pain, Difficulty Breathing Lying Down, Murmur, Palpitations, Racing/skipping heartbeats and Swelling. Gastrointestinal Not Present- Abdominal Pain, Bloody Stool, Constipation,  Diarrhea, Difficulty Swallowing, Heartburn,   Jaundice, Loss of appetitie, Nausea and Vomiting. Female Genitourinary Not Present- Blood in Urine, Discharge, Flank Pain, Incontinence, Painful Urination, Urgency, Urinary frequency, Urinary Retention, Urinating at Night and Weak urinary stream. Musculoskeletal Present- Joint Pain, Joint Swelling and Morning Stiffness. Not Present- Back Pain, Muscle Pain, Muscle Weakness and Spasms. Neurological Not Present- Blackout spells, Difficulty with balance, Dizziness, Paralysis, Tremor and Weakness. Psychiatric Not Present- Insomnia.  Physical Exam  General Mental Status -Alert, cooperative and good historian. General Appearance-pleasant, Not in acute distress. Orientation-Oriented X3. Build & Nutrition-Well nourished and Well developed. Gait-Antalgic.  Head and Neck Head-normocephalic, atraumatic . Neck Global Assessment - supple, no bruit auscultated on the right, no bruit auscultated on the left.  Eye Pupil - Bilateral-Regular and Round. Motion - Bilateral-EOMI.  Chest and Lung Exam Auscultation Breath sounds - clear at anterior chest wall and clear at posterior chest wall. Adventitious sounds - No Adventitious sounds.  Cardiovascular Auscultation Rhythm - Regular rate and rhythm. Heart Sounds - S1 WNL and S2 WNL. Murmurs & Other Heart Sounds - Auscultation of the heart reveals - No Murmurs.  Abdomen Palpation/Percussion Tenderness - Abdomen is non-tender to palpation. Rigidity (guarding) - Abdomen is soft. Auscultation Auscultation of the abdomen reveals - Bowel sounds normal.  Female Genitourinary Not done, not pertinent to present illness  Musculoskeletal Moderate distress. Walks with an antalgic gait. Exquisitely tender in medial joint line. Patellofemoral pain with compression. Ranges -5 to 110. Knee exam on inspection reveals no evidence of soft tissue swelling, ecchymosis, deformity or erythema. On palpation there  is no tenderness in the medial and lateral joint line. No patellofemoral pain with compression. Nontender over the fibular head or the peroneal nerve. Nontender over the quadriceps insertion of the patellar ligament insertion. The range of motion was full. Provocative maneuvers revealed a negative Lachman, negative anterior and posterior drawer and a negative McMurray. No instability was noted with varus and valgus stressing at 0 or 30 degrees. On manual motor test the quadriceps and hamstrings were 5/5. Sensory exam was intact to light touch.  Imaging xrays with bone on bone arthrosis left knee medial compartment.  Assessment & Plan Primary osteoarthritis of left knee (M17.12)  Pt with end-stage Left knee DJD, bone-on-bone, refractory to conservative tx, scheduled for Left total knee replacement by Dr. Beane on 09/21/15. We again discussed the procedure itself as well as risks, complications and alternatives, including but not limited to DVT, PE, infx, bleeding, failure of procedure, need for secondary procedure including manipulation, nerve injury, ongoing pain/symptoms, anesthesia risk, even stroke or death. Also discussed typical post-op protocols, activity restrictions, need for PT, flexion/extension exercises, time out of work, ice and elevation. Discussed need for DVT ppx post-op with Xarelto then ASA per protocol. Discussed dental ppx. Also discussed limitations post-operatively such as kneeling and squatting. All questions were answered. Patient desires to proceed with surgery as scheduled. Will hold ASA and NSAIDs accordingly. Will remain NPO after MN night before surgery. Will present to [facility] for pre-op testing. Plan Xarelto in hospital then home with ASA 325mg BID post-op for DVT ppx. Plan Percocet, Colace, Miralax for D/C and Movantek peri-operatively. Plan home with HHPT post-op with daughter at home for assistance. Will follow up 10-14 days post-op for suture removal and xrays.  Plan left  total knee replacement  Signed electronically by Jaclyn M Bissell, PA-C for Dr. Beane  

## 2015-09-22 LAB — CBC
HEMATOCRIT: 40.1 % (ref 36.0–46.0)
HEMOGLOBIN: 13.3 g/dL (ref 12.0–15.0)
MCH: 29.1 pg (ref 26.0–34.0)
MCHC: 33.2 g/dL (ref 30.0–36.0)
MCV: 87.7 fL (ref 78.0–100.0)
Platelets: 240 10*3/uL (ref 150–400)
RBC: 4.57 MIL/uL (ref 3.87–5.11)
RDW: 13.6 % (ref 11.5–15.5)
WBC: 14.3 10*3/uL — ABNORMAL HIGH (ref 4.0–10.5)

## 2015-09-22 LAB — BASIC METABOLIC PANEL
Anion gap: 7 (ref 5–15)
BUN: 8 mg/dL (ref 6–20)
CALCIUM: 8.8 mg/dL — AB (ref 8.9–10.3)
CHLORIDE: 103 mmol/L (ref 101–111)
CO2: 26 mmol/L (ref 22–32)
CREATININE: 0.6 mg/dL (ref 0.44–1.00)
GFR calc Af Amer: >60 mL/min (ref >60)
GFR calc non Af Amer: >60 mL/min (ref >60)
GLUCOSE: 152 mg/dL — AB (ref 65–99)
Potassium: 4 mmol/L (ref 3.5–5.1)
Sodium: 136 mmol/L (ref 135–145)

## 2015-09-22 NOTE — Progress Notes (Signed)
CSW consulted for SNF placement. PN reviewed. PT has recommended HHPT at d/c. RNCM will assist with d/c planning needs.  Jazae Gandolfi LCSW [phone] 

## 2015-09-22 NOTE — Evaluation (Signed)
Occupational Therapy Evaluation Patient Details Name: [name] MRN: [id number] DOB: October 16, 1933 Today's Date: 09/22/2015    History of Present Illness s/p L TKR with hx of R TKR 1/17   Clinical Impression   This 82 year old female was admitted for the above sx. She will benefit from continued OT in acute to increase safety and independence with adls.  Goals are for supervision to min guard.  Pt was limited by pain during evaluation    Follow Up Recommendations  Supervision - Intermittent    Equipment Recommendations  None recommended by OT    Recommendations for Other Services       Precautions / Restrictions Precautions Precautions: Fall;Knee Required Braces or Orthoses: Knee Immobilizer - Left Knee Immobilizer - Left: Discontinue once straight leg raise with < 10 degree lag Restrictions Other Position/Activity Restrictions: WBAT      Mobility Bed Mobility         Supine to sit: Min assist     General bed mobility comments: assist for RLE  Transfers   Equipment used: Rolling walker (2 wheeled) Transfers: Sit to/from Stand Sit to Stand: Min assist         General transfer comment: steadying assistance; cues for LE placement    Balance                                            ADL Overall ADL's : Needs assistance/impaired                         Toilet Transfer: Minimal assistance;Stand-pivot;RW (to chair)             General ADL Comments: When OT first arrived to see pt, she was premedicated but pain was 10/10 when KI donned. Returned after she had IV medication.  Pt's pain much better until she moved (4-8).  She is assisting with lifting leg OOB but she did not want to perform any ADL activities at this time due to pain.  Based on clinical judgment, pt is able to perform UB adls with set up and LB bathing with mod A/dressing with max A.       Vision     Perception     Praxis      Pertinent  Vitals/Pain Pain Score:  (4-8) Pain Location: L knee Pain Descriptors / Indicators: Aching Pain Intervention(s): Limited activity within patient's tolerance;Monitored during session;Premedicated before session;Repositioned;Ice applied     Hand Dominance     Extremity/Trunk Assessment Upper Extremity Assessment Upper Extremity Assessment: Overall WFL for tasks assessed           Communication Communication Communication: No difficulties   Cognition Arousal/Alertness: Awake/alert Behavior During Therapy: WFL for tasks assessed/performed Overall Cognitive Status: Within Functional Limits for tasks assessed                     General Comments       Exercises       Shoulder Instructions      Home Living Family/patient expects to be discharged to:: Private residence Living Arrangements: Alone Available Help at Discharge: Family Type of Home: House             Bathroom Shower/Tub: Tub/shower unit Shower/tub characteristics: Door Firefighter: Standard  Prior Functioning/Environment Level of Independence: Independent             OT Diagnosis:    acute pain       OT Problem List: Decreased strength;Decreased activity tolerance;Pain   OT Treatment/Interventions: Self-care/ADL training;DME and/or AE instruction;Patient/family education    OT Goals(Current goals can be found in the care plan section) Acute Rehab OT Goals Patient Stated Goal: Regain IND OT Goal Formulation: With patient Time For Goal Achievement: 09/29/15 Potential to Achieve Goals: Good ADL Goals Pt Will Perform Grooming: with supervision;standing Pt Will Transfer to Toilet: with min guard assist;ambulating;regular height toilet;bedside commode Pt Will Perform Toileting - Clothing Manipulation and hygiene: with min guard assist;sit to/from stand  OT Frequency: Min 2X/week   Barriers to D/C:            Co-evaluation              End of  Session    Activity Tolerance: Patient limited by pain Patient left: in chair;with call bell/phone within reach;with family/visitor present   Time: [id number] OT Time Calculation (min): 20 min Charges:  OT General Charges $OT Visit: 1 Procedure OT Evaluation $OT Eval Low Complexity: 1 Procedure G-Codes:    Azaryah Oleksy 09/22/2015, 12:45 PM  Marica OtterMaryellen Verdun Rackley, OTR/L [phone] 09/22/2015

## 2015-09-22 NOTE — Discharge Instructions (Signed)
INSTRUCTIONS AFTER JOINT REPLACEMENT   o Remove items at home which could result in a fall. This includes throw rugs or furniture in walking pathways o ICE to the affected joint every three hours while awake for 30 minutes at a time, for at least the first 3-5 days, and then as needed for pain and swelling.  Continue to use ice for pain and swelling. You may notice swelling that will progress down to the foot and ankle.  This is normal after surgery.  Elevate your leg when you are not up walking on it.   o Continue to use the breathing machine you got in the hospital (incentive spirometer) which will help keep your temperature down.  It is common for your temperature to cycle up and down following surgery, especially at night when you are not up moving around and exerting yourself.  The breathing machine keeps your lungs expanded and your temperature down.   DIET:  As you were doing prior to hospitalization, we recommend a well-balanced diet.  DRESSING / WOUND CARE / SHOWERING  You may shower 3 days after surgery, but keep the wounds dry during showering.  You may use an occlusive plastic wrap (Press'n Seal for example), NO SOAKING/SUBMERGING IN THE BATHTUB.  If the bandage gets wet, change with a clean dry gauze.  If the incision gets wet, pat the wound dry with a clean towel.  ACTIVITY  o Increase activity slowly as tolerated, but follow the weight bearing instructions below.   o No driving for 6 weeks or until further direction given by your physician.  You cannot drive while taking narcotics.  o No lifting or carrying greater than 10 lbs. until further directed by your surgeon. o Avoid periods of inactivity such as sitting longer than an hour when not asleep. This helps prevent blood clots.  o You may return to work once you are authorized by your doctor.     WEIGHT BEARING   As tolerated left leg.   EXERCISES  Results after joint replacement surgery are often greatly improved when  you follow the exercise, range of motion and muscle strengthening exercises prescribed by your doctor. Safety measures are also important to protect the joint from further injury. Any time any of these exercises cause you to have increased pain or swelling, decrease what you are doing until you are comfortable again and then slowly increase them. If you have problems or questions, call your caregiver or physical therapist for advice.   Rehabilitation is important following a joint replacement. After just a few days of immobilization, the muscles of the leg can become weakened and shrink (atrophy).  These exercises are designed to build up the tone and strength of the thigh and leg muscles and to improve motion. Often times heat used for twenty to thirty minutes before working out will loosen up your tissues and help with improving the range of motion but do not use heat for the first two weeks following surgery (sometimes heat can increase post-operative swelling).   These exercises can be done on a training (exercise) mat, on the floor, on a table or on a bed. Use whatever works the best and is most comfortable for you.    Use music or television while you are exercising so that the exercises are a pleasant break in your day. This will make your life better with the exercises acting as a break in your routine that you can look forward to.   Perform all  exercises about fifteen times, three times per day or as directed.  You should exercise both the operative leg and the other leg as well.  Exercises include:    Quad Sets - Tighten up the muscle on the front of the thigh (Quad) and hold for 5-10 seconds.    Straight Leg Raises - With your knee straight (if you were given a brace, keep it on), lift the leg to 60 degrees, hold for 3 seconds, and slowly lower the leg.  Perform this exercise against resistance later as your leg gets stronger.   Leg Slides: Lying on your back, slowly slide your foot toward  your buttocks, bending your knee up off the floor (only go as far as is comfortable). Then slowly slide your foot back down until your leg is flat on the floor again.   Angel Wings: Lying on your back spread your legs to the side as far apart as you can without causing discomfort.   Hamstring Strength:  Lying on your back, push your heel against the floor with your leg straight by tightening up the muscles of your buttocks.  Repeat, but this time bend your knee to a comfortable angle, and push your heel against the floor.  You may put a pillow under the heel to make it more comfortable if necessary.   A rehabilitation program following joint replacement surgery can speed recovery and prevent re-injury in the future due to weakened muscles. Contact your doctor or a physical therapist for more information on knee rehabilitation.    CONSTIPATION  Constipation is defined medically as fewer than three stools per week and severe constipation as less than one stool per week.  Even if you have a regular bowel pattern at home, your normal regimen is likely to be disrupted due to multiple reasons following surgery.  Combination of anesthesia, postoperative narcotics, change in appetite and fluid intake all can affect your bowels.   YOU MUST use at least one of the following options; they are listed in order of increasing strength to get the job done.  They are all available over the counter, and you may need to use some, POSSIBLY even all of these options:    Drink plenty of fluids (prune juice may be helpful) and high fiber foods Colace 100 mg by mouth twice a day  Senokot for constipation as directed and as needed Dulcolax (bisacodyl), take with full glass of water  Miralax (polyethylene glycol) once or twice a day as needed.  If you have tried all these things and are unable to have a bowel movement in the first 3-4 days after surgery call either your surgeon or your primary doctor.    If you  experience loose stools or diarrhea, hold the medications until you stool forms back up.  If your symptoms do not get better within 1 week or if they get worse, check with your doctor.  If you experience "the worst abdominal pain ever" or develop nausea or vomiting, please contact the office immediately for further recommendations for treatment.   ITCHING:  If you experience itching with your medications, try taking only a single pain pill, or even half a pain pill at a time.  You can also use Benadryl over the counter for itching or also to help with sleep.   TED HOSE STOCKINGS:  Use stockings on both legs until for at least 2 weeks or as directed by physician office. They may be removed at night for sleeping.  MEDICATIONS:  See your medication summary on the After Visit Summary that nursing will review with you.  You may have some home medications which will be placed on hold until you complete the course of blood thinner medication.  It is important for you to complete the blood thinner medication as prescribed.  PRECAUTIONS:  If you experience chest pain or shortness of breath - call 911 immediately for transfer to the hospital emergency department.   If you develop a fever greater that 101 F, purulent drainage from wound, increased redness or drainage from wound, foul odor from the wound/dressing, or calf pain - CONTACT YOUR SURGEON.                                                   FOLLOW-UP APPOINTMENTS:  If you do not already have a post-op appointment, please call the office for an appointment to be seen by your surgeon.  Guidelines for how soon to be seen are listed in your After Visit Summary, but are typically between 1-4 weeks after surgery.  OTHER INSTRUCTIONS:   Knee Replacement:  Do not place pillow under knee, focus on keeping the knee straight while resting. CPM instructions: 0-90 degrees, 2 hours in the morning, 2 hours in the afternoon, and 2 hours in the evening. Place foam  block, curve side up under heel at all times except when in CPM or when walking.  DO NOT modify, tear, cut, or change the foam block in any way.  MAKE SURE YOU:   Understand these instructions.   Get help right away if you are not doing well or get worse.    Thank you for letting us be a part of your medical care team.  It is a privilege we respect greatly.  We hope these instructions will help you stay on track for a fast and full recovery!

## 2015-09-22 NOTE — Progress Notes (Signed)
Physical Therapy Treatment Patient Details Name: Jasmine PiggMarian T Anastasi MRN: [id number] DOB: 03/03/1933 Today's Date: 09/22/2015    History of Present Illness s/p L TKR with hx of R TKR 1/17    PT Comments    Pt continues cooperative but ltd by pain.  Completed very ltd therex with frequent and extended rest breaks but OOB deferred this session.  Follow Up Recommendations  Home health PT     Equipment Recommendations  None recommended by PT    Recommendations for Other Services OT consult     Precautions / Restrictions Precautions Precautions: Fall;Knee Required Braces or Orthoses: Knee Immobilizer - Left Knee Immobilizer - Right: Discontinue once straight leg raise with < 10 degree lag Knee Immobilizer - Left: Discontinue once straight leg raise with < 10 degree lag Restrictions Weight Bearing Restrictions: No Other Position/Activity Restrictions: WBAT    Mobility  Bed Mobility Overal bed mobility: Needs Assistance Bed Mobility: Sit to Supine     Supine to sit: Min assist Sit to supine: Min assist;Mod assist   General bed mobility comments: cues for sequence and use of R LE to self assist  Transfers Overall transfer level: Needs assistance Equipment used: Rolling walker (2 wheeled) Transfers: Sit to/from Stand Sit to Stand: Min assist;Mod assist;+2 safety/equipment         General transfer comment: steadying assistance; cues for LE placement  Ambulation/Gait Ambulation/Gait assistance: Min assist;Mod assist;+2 safety/equipment Ambulation Distance (Feet): 2 Feet Assistive device: Rolling walker (2 wheeled) Gait Pattern/deviations: Step-to pattern;Shuffle;Trunk flexed;Decreased stance time - left Gait velocity: decr Gait velocity interpretation: Below normal speed for age/gender General Gait Details: Pt tolerating no WB on L LE 2* calf pain.  Cues for sequence, posture and position from RW   Stairs            Wheelchair Mobility    Modified Rankin  (Stroke Patients Only)       Balance                                    Cognition Arousal/Alertness: Awake/alert Behavior During Therapy: WFL for tasks assessed/performed Overall Cognitive Status: Within Functional Limits for tasks assessed                      Exercises Total Joint Exercises Ankle Circles/Pumps: AROM;Both;Supine;20 reps (ltd movement on L but improved tolerance from am session) Quad Sets: AROM;Both;5 reps;Supine Heel Slides: AAROM;Left;5 reps Straight Leg Raises: AAROM;Left;Other (comment);Supine (7) Goniometric ROM: AAROM -12 - 30 pain ltd    General Comments        Pertinent Vitals/Pain Pain Assessment: Faces Pain Score:  (4-8) Faces Pain Scale: Hurts whole lot Pain Location: L knee Pain Descriptors / Indicators: Aching;Grimacing;Guarding Pain Intervention(s): Limited activity within patient's tolerance;Monitored during session;Premedicated before session;Ice applied    Home Living                      Prior Function Level of Independence: Independent          PT Goals (current goals can now be found in the care plan section) Acute Rehab PT Goals Patient Stated Goal: Regain IND PT Goal Formulation: With patient Time For Goal Achievement: 09/25/15 Potential to Achieve Goals: Good Progress towards PT goals: Not progressing toward goals - comment (pain ltd)    Frequency    7X/week      PT Plan Current plan remains  appropriate    Co-evaluation             End of Session Equipment Utilized During Treatment: Gait belt;Left knee immobilizer Activity Tolerance: Patient limited by pain Patient left: in bed;with call bell/phone within reach;with family/visitor present     Time: 1610-9604 PT Time Calculation (min) (ACUTE ONLY): 24 min  Charges:  $Therapeutic Exercise: 23-37 mins $Therapeutic Activity: 23-37 mins                    G Codes:      Santosh Petter October 01, 2015, 3:19 PM

## 2015-09-22 NOTE — Progress Notes (Signed)
   Subjective: 1 Day Post-Op Procedure(s) (LRB): LEFT TOTAL KNEE ARTHROPLASTY (Left) Patient reports pain as mild and moderate.   Patient seen in rounds for Dr. Shelle IronBeane. She had a tough night  But doing better this morning. Patient is well, but has had some minor complaints of pain in the knee, requiring pain medications . We will resume therapy today.  She got up and walked 18 feet yesterday. Plan is to go home with daughter after hospital stay.  Objective: Vital signs in last 24 hours: Temp:  [98.2 F (36.8 C)-98.8 F (37.1 C)] 98.2 F (36.8 C) (09/15 1003) Pulse Rate:  [80-100] 94 (09/15 1003) Resp:  [12-21] 16 (09/15 1003) BP: (106-177)/(57-107) 158/78 (09/15 1003) SpO2:  [98 %-100 %] 100 % (09/15 1003)  Intake/Output from previous day:  Intake/Output Summary (Last 24 hours) at 09/22/15 1014 Last data filed at 09/22/15 1011  Gross per 24 hour  Intake           3472.5 ml  Output             4330 ml  Net           -857.5 ml    Intake/Output this shift: Total I/O In: 480 [P.O.:480] Out: 400 [Urine:400]  Labs:  Recent Labs  09/22/15 0423  HGB 13.3    Recent Labs  09/22/15 0423  WBC 14.3*  RBC 4.57  HCT 40.1  PLT 240    Recent Labs  09/22/15 0423  NA 136  K 4.0  CL 103  CO2 26  BUN 8  CREATININE 0.60  GLUCOSE 152*  CALCIUM 8.8*   No results for input(s): LABPT, INR in the last 72 hours.  EXAM General - Patient is Alert, Appropriate and Oriented Extremity - Neurovascular intact Sensation intact distally Dorsiflexion/Plantar flexion intact Dressing - dressing C/D/I Motor Function - intact, moving foot and toes well on exam.   Past Medical History:  Diagnosis Date  . Acute blood loss anemia   . Arthritis   . Benign essential HTN   . Constipation   . GERD (gastroesophageal reflux disease)    infrequently - otc med as needed  . 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  Hyponatremia   . Hypothyroidism   . Leukocytosis   . Primary osteoarthritis of right knee   . Rosacea   . Unsteady gait     Assessment/Plan: 1 Day Post-Op Procedure(s) (LRB): LEFT TOTAL KNEE ARTHROPLASTY (Left) Active Problems:   Right knee DJD  Estimated body mass index is 28.42 kg/m as calculated from the following:   Height as of 09/15/15: 4\' 10"  (1.473 m).   Weight as of 09/15/15: 61.7 kg (136 lb). Advance diet Up with therapy Plan for discharge tomorrow Discharge home with home health  DVT Prophylaxis - Aspirin 325 mg BID Weight-Bearing as tolerated to left leg D/C O2 and Pulse OX and try on Room Air Home with ASA 325mg  BID post-op for DVT ppx. Plan Percocet, Colace, Miralax for D/C and Movantek peri-operatively. Plan home with HHPT post-op with daughter at home for assistance. Will follow up 10-14 days post-op for suture removal and xrays.  Avel Peacerew Matteo Banke, PA-C Orthopaedic Surgery 09/22/2015, 10:14 AM

## 2015-09-22 NOTE — Progress Notes (Signed)
Physical Therapy Treatment Patient Details Name: Jasmine PiggMarian T Hoffman MRN: [id number] DOB: 12/01/1933 Today's Date: 09/22/2015    History of Present Illness s/p L TKR with hx of R TKR 1/17    PT Comments    Pt cooperative but extremely ltd by knee and calf pain.  Pt unable to DF or WB on L LE without ++pain - Dr Shelle IronBeane and RN aware.    Follow Up Recommendations  Home health PT     Equipment Recommendations  None recommended by PT    Recommendations for Other Services OT consult     Precautions / Restrictions Precautions Precautions: Fall;Knee Required Braces or Orthoses: Knee Immobilizer - Left Knee Immobilizer - Right: Discontinue once straight leg raise with < 10 degree lag Knee Immobilizer - Left: Discontinue once straight leg raise with < 10 degree lag Restrictions Weight Bearing Restrictions: No Other Position/Activity Restrictions: WBAT    Mobility  Bed Mobility Overal bed mobility: Needs Assistance Bed Mobility: Sit to Supine     Supine to sit: Min assist Sit to supine: Min assist;Mod assist   General bed mobility comments: cues for sequence and use of R LE to self assist  Transfers Overall transfer level: Needs assistance Equipment used: Rolling walker (2 wheeled) Transfers: Sit to/from Stand Sit to Stand: Min assist;Mod assist;+2 safety/equipment         General transfer comment: steadying assistance; cues for LE placement  Ambulation/Gait Ambulation/Gait assistance: Min assist;Mod assist;+2 safety/equipment Ambulation Distance (Feet): 2 Feet Assistive device: Rolling walker (2 wheeled) Gait Pattern/deviations: Step-to pattern;Shuffle;Trunk flexed;Decreased stance time - left Gait velocity: decr Gait velocity interpretation: Below normal speed for age/gender General Gait Details: Pt tolerating no WB on L LE 2* calf pain.  Cues for sequence, posture and position from RW   Stairs            Wheelchair Mobility    Modified Rankin (Stroke  Patients Only)       Balance                                    Cognition Arousal/Alertness: Awake/alert Behavior During Therapy: WFL for tasks assessed/performed Overall Cognitive Status: Within Functional Limits for tasks assessed                      Exercises Total Joint Exercises Ankle Circles/Pumps: AROM;Both;5 reps;Supine Quad Sets: AROM;Both;5 reps;Supine    General Comments        Pertinent Vitals/Pain Pain Assessment: Faces Pain Score:  (4-8) Faces Pain Scale: Hurts worst Pain Location: Calf pain with attempts to WB or dorsiflex and knee pain Pain Descriptors / Indicators: Spasm;Grimacing;Guarding;Crying Pain Intervention(s): Limited activity within patient's tolerance;Monitored during session;Premedicated before session;Patient requesting pain meds-RN notified;Ice applied    Home Living Family/patient expects to be discharged to:: Private residence Living Arrangements: Alone Available Help at Discharge: Family Type of Home: House              Prior Function Level of Independence: Independent          PT Goals (current goals can now be found in the care plan section) Acute Rehab PT Goals Patient Stated Goal: Regain IND PT Goal Formulation: With patient Time For Goal Achievement: 09/25/15 Potential to Achieve Goals: Good Progress towards PT goals: Progressing toward goals    Frequency   PT Diagnosis  7X/week   Primary osteoarthritis of left knee - Plan: Weight  bearing as tolerated  S/P TKR (total knee replacement) using cement - Plan: DG Knee Left Port, DG Knee Left Port, Weight bearing as tolerated  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

## 2015-09-23 LAB — CBC
HCT: 37.2 % (ref 36.0–46.0)
Hemoglobin: 12.2 g/dL (ref 12.0–15.0)
MCH: 28.4 pg (ref 26.0–34.0)
MCHC: 32.8 g/dL (ref 30.0–36.0)
MCV: 86.7 fL (ref 78.0–100.0)
PLATELETS: 203 10*3/uL (ref 150–400)
RBC: 4.29 MIL/uL (ref 3.87–5.11)
RDW: 13.7 % (ref 11.5–15.5)
WBC: 8.7 10*3/uL (ref 4.0–10.5)

## 2015-09-23 NOTE — Discharge Summary (Signed)
Physician Discharge Summary  Patient ID: [name] MRN: [id number] DOB/AGE: 1933-10-10 82 y.o.  Admit date: 09/21/2015 Discharge date: 09/23/2015  Admission Diagnoses: L knee DJD; hypothyroid  Discharge Diagnoses:  Same as above  Discharged Condition: stable  Hospital Course: Patient reported to the [facility] operating room on 09/21/15 for elective L TKR by Dr. Shelle Iron.  The patient tolerated the procedure well without complication.  The patient was then admitted to the hospital.  She worked successfully with therapy and is to D/C'd home on 09/23/15.  The patient tolerated her stay well without complication  Consults: PT/OT  Significant Diagnostic Studies: radiology: X-Ray: to ensure satisfactory anatomic alignment during operative procedure.  Treatments: IV hydration, antibiotics: Ancef, analgesia: acetaminophen, Vicodin and Dilaudid, anticoagulation: ASA and surgery: as stated above  Discharge Exam: Blood pressure (!) 144/64, pulse (!) 102, temperature 98.6 F (37 C), temperature source Oral, resp. rate 14, height 4' 10.5" (1.486 m), weight 60.8 kg (134 lb), SpO2 96 %. General: WDWN patient in NAD. Psych:  Appropriate mood and affect. Neuro:  A&O x 3, Moving all extremities, sensation intact to light touch HEENT:  EOMs intact Chest:  Even non-labored respirations Skin:  ACE bandage removed, dressing C/D/I, no rashes or lesions Extremities: warm/dry, mild edema, no erythmea or echymosis.  No lymphadenopathy. Pulses: Popliteus 2+ MSK:  ROM: Full ankle ROM, MMT: patient is able to perform quad set, (-) Homan's   Disposition: home  Discharge Instructions    Call MD / Call 911    Complete by:  As directed    If you experience chest pain or shortness of breath, CALL 911 and be transported to the hospital emergency room.  If you develope a fever above 101 F, pus (white drainage) or increased drainage or redness at the wound, or calf pain, call your surgeon's office.   Call  MD / Call 911    Complete by:  As directed    If you experience chest pain or shortness of breath, CALL 911 and be transported to the hospital emergency room.  If you develope a fever above 101 F, pus (white drainage) or increased drainage or redness at the wound, or calf pain, call your surgeon's office.   Change dressing    Complete by:  As directed    Change dressing daily with sterile 4 x 4 inch gauze dressing and apply TED hose. Do not submerge the incision under water.   Constipation Prevention    Complete by:  As directed    Drink plenty of fluids.  Prune juice may be helpful.  You may use a stool softener, such as Colace (over the counter) 100 mg twice a day.  Use MiraLax (over the counter) for constipation as needed.   Constipation Prevention    Complete by:  As directed    Drink plenty of fluids.  Prune juice may be helpful.  You may use a stool softener, such as Colace (over the counter) 100 mg twice a day.  Use MiraLax (over the counter) for constipation as needed.   Diet - low sodium heart healthy    Complete by:  As directed    Diet - low sodium heart healthy    Complete by:  As directed    Discharge instructions    Complete by:  As directed    Pick up stool softner and laxative for home use following surgery while on pain medications. Do not submerge incision under water. Please use good hand washing techniques while  changing dressing each day. May shower starting three days after surgery. Please use a clean towel to pat the incision dry following showers. Continue to use ice for pain and swelling after surgery. Do not use any lotions or creams on the incision until instructed by your surgeon.   Postoperative Constipation Protocol  Constipation - defined medically as fewer than three stools per week and severe constipation as less than one stool per week.  One of the most common issues patients have following surgery is constipation.  Even if you have a regular bowel  pattern at home, your normal regimen is likely to be disrupted due to multiple reasons following surgery.  Combination of anesthesia, postoperative narcotics, change in appetite and fluid intake all can affect your bowels.  In order to avoid complications following surgery, here are some recommendations in order to help you during your recovery period.  Colace (docusate) - Pick up an over-the-counter form of Colace or another stool softener and take twice a day as long as you are requiring postoperative pain medications.  Take with a full glass of water daily.  If you experience loose stools or diarrhea, hold the colace until you stool forms back up.  If your symptoms do not get better within 1 week or if they get worse, check with your doctor.  Dulcolax (bisacodyl) - Pick up over-the-counter and take as directed by the product packaging as needed to assist with the movement of your bowels.  Take with a full glass of water.  Use this product as needed if not relieved by Colace only.   MiraLax (polyethylene glycol) - Pick up over-the-counter to have on hand.  MiraLax is a solution that will increase the amount of water in your bowels to assist with bowel movements.  Take as directed and can mix with a glass of water, juice, soda, coffee, or tea.  Take if you go more than two days without a movement. Do not use MiraLax more than once per day. Call your doctor if you are still constipated or irregular after using this medication for 7 days in a row.  If you continue to have problems with postoperative constipation, please contact the office for further assistance and recommendations.  If you experience "the worst abdominal pain ever" or develop nausea or vomiting, please contact the office immediatly for further recommendations for treatment.   Do not put a pillow under the knee. Place it under the heel.    Complete by:  As directed    Do not sit on low chairs, stoools or toilet seats, as it may be  difficult to get up from low surfaces    Complete by:  As directed    Driving restrictions    Complete by:  As directed    No driving until released by the physician.   Increase activity slowly as tolerated    Complete by:  As directed    Increase activity slowly as tolerated    Complete by:  As directed    Lifting restrictions    Complete by:  As directed    No lifting until released by the physician.   Patient may shower    Complete by:  As directed    You may shower without a dressing once there is no drainage.  Do not wash over the wound.  If drainage remains, do not shower until drainage stops.   TED hose    Complete by:  As directed    Use stockings (  TED hose) for 3 weeks on both leg(s).  You may remove them at night for sleeping.   Weight bearing as tolerated    Complete by:  As directed    Laterality:  left   Extremity:  Lower   Weight bearing as tolerated    Complete by:  As directed    Laterality:  left   Extremity:  Lower       Medication List    STOP taking these medications   Biotin 2500 MCG Caps   Calcium-Magnesium-Zinc-D3 Tabs   pyridoxine 100 MG tablet Commonly known as:  B-6   vitamin C 500 MG tablet Commonly known as:  ASCORBIC ACID     TAKE these medications   aspirin EC 325 MG tablet Take 1 tablet (325 mg total) by mouth 2 (two) times daily after a meal. What changed:  medication strength  how much to take  when to take this   docusate sodium 100 MG capsule Commonly known as:  COLACE Take 1 capsule (100 mg total) by mouth 2 (two) times daily as needed for mild constipation.   levothyroxine 88 MCG tablet Commonly known as:  SYNTHROID, LEVOTHROID Take 88 mcg by mouth daily before breakfast.   oxyCODONE-acetaminophen 5-325 MG tablet Commonly known as:  PERCOCET Take 1-2 tablets by mouth every 4 (four) hours as needed for severe pain.   polyethylene glycol packet Commonly known as:  MIRALAX / GLYCOLAX Take 17 g by mouth daily.    SYSTANE OP Apply 1 drop to eye 2 (two) times daily. Both eyes      Follow-up Information    Javier DockerBEANE,JEFFREY C, MD.   Specialty:  Orthopedic Surgery Contact information: [address] [place] [address] [phone]        [facility] .   Why:  physical therapy Contact information: [address] [place] [address] [phone]           Signed: Alfredo MartinezJustin Aloni Chuang, Cordelia PochePA-C, ATC [facility] Office:  [phone]

## 2015-09-23 NOTE — Progress Notes (Signed)
Occupational Therapy Treatment Patient Details Name: [name] MRN: [id number] DOB: [birth year] Today's Date: 09/23/2015    History of present illness s/p L TKR with hx of R TKR 1/17   OT comments  Pt demonstrates improving functional mobility and ability to assist with ADLs.  Daughter will assist at discharge.   Follow Up Recommendations  Supervision - Intermittent    Equipment Recommendations  None recommended by OT    Recommendations for Other Services      Precautions / Restrictions Precautions Precautions: Fall;Knee Required Braces or Orthoses: Knee Immobilizer - Left Knee Immobilizer - Left: Discontinue once straight leg raise with < 10 degree lag Restrictions Weight Bearing Restrictions: No Other Position/Activity Restrictions: WBAT       Mobility Bed Mobility Overal bed mobility: Needs Assistance Bed Mobility: Supine to Sit     Supine to sit: Min assist     General bed mobility comments: cues for sequence and use of R LE to self assist  Transfers Overall transfer level: Needs assistance Equipment used: Rolling walker (2 wheeled) Transfers: Sit to/from [employer] Sit to Stand: Min guard Stand pivot transfers: Min guard       General transfer comment: steadying assistance; cues for LE placement    Balance                                   ADL Overall ADL's : Needs assistance/impaired     Grooming: Wash/dry hands;Min guard;Standing               Lower Body Dressing: Moderate assistance;Sit to/from stand Lower Body Dressing Details (indicate cue type and reason): daughter will assisst at discharge.  Instructed pt to don Lt LE first  Toilet Transfer: Min guard;Ambulation;Comfort height toilet;Grab bars;RW Statistician Details (indicate cue type and reason): discussed options for toilet seats  Toileting- Clothing Manipulation and Hygiene: Min guard;Sit to/from stand     Tub/Shower Transfer Details  (indicate cue type and reason): Discussed options for tub transfer and DME.  Pt reports she will initially sponge bateh           Vision                     Perception     Praxis      Cognition   Behavior During Therapy: [facility] for tasks assessed/performed Overall Cognitive Status: Within Functional Limits for tasks assessed                       Extremity/Trunk Assessment               Exercises Total Joint Exercises Ankle Circles/Pumps: AROM;Both;Supine;20 reps Quad Sets: AROM;Both;Supine;10 reps Heel Slides: AAROM;Left;10 reps;Supine Straight Leg Raises: AAROM;Left;10 reps;Supine Goniometric ROM: AAROM at L knee -10 - 35   Shoulder Instructions       General Comments      Pertinent Vitals/ Pain       Pain Assessment: No/denies pain Pain Score: 4  Pain Location: L knee Pain Descriptors / Indicators: Aching;Sore Pain Intervention(s): Limited activity within patient's tolerance;Monitored during session;Premedicated before session;Ice applied  Home Living                                          Prior Functioning/Environment  Frequency  Min 2X/week        Progress Toward Goals  OT Goals(current goals can now be found in the care plan section)  Progress towards OT goals: Progressing toward goals  Acute Rehab OT Goals Patient Stated Goal: Regain IND  Plan Discharge plan remains appropriate    Co-evaluation                 End of Session Equipment Utilized During Treatment: Rolling walker   Activity Tolerance Patient tolerated treatment well   Patient Left in chair;with call bell/phone within reach;with chair alarm set   Nurse Communication Mobility status        Time: [id number] OT Time Calculation (min): 31 min  Charges: OT General Charges $OT Visit: 1 Procedure OT Treatments $Self Care/Home Management : 23-37 mins  Kenasia Scheller M 09/23/2015, 1:55 PM

## 2015-09-23 NOTE — Progress Notes (Signed)
Physical Therapy Treatment Patient Details Name: Jasmine PiggMarian T Coakley MRN: [id number] DOB: 02/27/1933 Today's Date: 09/23/2015    History of Present Illness s/p L TKR with hx of R TKR 1/17    PT Comments    Pt continues to progress with mobility.  Dtr present and participating in session including don/doff KI, stairs, and bed mobility.  Follow Up Recommendations  Home health PT     Equipment Recommendations  None recommended by PT    Recommendations for Other Services OT consult     Precautions / Restrictions Precautions Precautions: Fall;Knee Required Braces or Orthoses: Knee Immobilizer - Left Knee Immobilizer - Left: Discontinue once straight leg raise with < 10 degree lag Restrictions Weight Bearing Restrictions: No Other Position/Activity Restrictions: WBAT    Mobility  Bed Mobility Overal bed mobility: Needs Assistance Bed Mobility: Sit to Supine       Sit to supine: Min assist   General bed mobility comments: cues for sequence and use of R LE to self assist  Transfers Overall transfer level: Needs assistance Equipment used: Rolling walker (2 wheeled) Transfers: Sit to/from Stand Sit to Stand: Min guard Stand pivot transfers: Min guard       General transfer comment: steadying assistance; cues for LE placement  Ambulation/Gait Ambulation/Gait assistance: Min guard Ambulation Distance (Feet): 35 Feet Assistive device: Rolling walker (2 wheeled) Gait Pattern/deviations: Step-to pattern;Decreased step length - right;Decreased step length - left;Shuffle;Trunk flexed Gait velocity: decr Gait velocity interpretation: Below normal speed for age/gender General Gait Details: min cues for sequence, posture, position from RW.     Stairs Stairs: Yes Stairs assistance: Min assist Stair Management: No rails;Step to pattern;Backwards;With walker Number of Stairs: 4 General stair comments: 2 stairs twice with dtr assisting on second attempt.  Cues for sequence  and foot/RW placement  Wheelchair Mobility    Modified Rankin (Stroke Patients Only)       Balance                                    Cognition Arousal/Alertness: Awake/alert Behavior During Therapy: WFL for tasks assessed/performed Overall Cognitive Status: Within Functional Limits for tasks assessed                      Exercises      General Comments        Pertinent Vitals/Pain Pain Assessment: 0-10 Pain Score: 4  Pain Location: L knee Pain Descriptors / Indicators: Aching;Sore Pain Intervention(s): Limited activity within patient's tolerance;Monitored during session;Premedicated before session;Ice applied    Home Living                      Prior Function            PT Goals (current goals can now be found in the care plan section) Acute Rehab PT Goals Patient Stated Goal: Regain IND PT Goal Formulation: With patient Time For Goal Achievement: 09/25/15 Potential to Achieve Goals: Good Progress towards PT goals: Progressing toward goals    Frequency    7X/week      PT Plan Current plan remains appropriate    Co-evaluation             End of Session Equipment Utilized During Treatment: Gait belt;Left knee immobilizer Activity Tolerance: Patient tolerated treatment well;Patient limited by fatigue Patient left: in bed;with call bell/phone within reach;with family/visitor present  Time: 1610-9604 PT Time Calculation (min) (ACUTE ONLY): 41 min  Charges:  $Gait Training: 8-22 mins $Therapeutic Activity: 23-37 mins                    G Codes:      Rolanda Campa September 30, 2015, 5:19 PM

## 2015-09-23 NOTE — Progress Notes (Signed)
Pt's vitals are WNL, tolerating diet and pain is under control. Discussed discharge instructions with both patient and daughter. Discharged to home with prescriptions. Pt already has DME at home.

## 2015-09-23 NOTE — Progress Notes (Signed)
Subjective: 2 Days Post-Op Procedure(s) (LRB): LEFT TOTAL KNEE ARTHROPLASTY (Left)  Patient reports pain as mild to moderate.  Reports that her pain is much improved today.  Denies fever, chills, N/V.  Denies BM, however admits to flatulence.  Objective:   VITALS:  Temp:  [98.2 F (36.8 C)-100.2 F (37.9 C)] 98.6 F (37 C) (09/16 0551) Pulse Rate:  [92-110] 102 (09/16 0551) Resp:  [14-16] 14 (09/16 0551) BP: (144-165)/(64-78) 144/64 (09/16 0551) SpO2:  [96 %-100 %] 96 % (09/16 0551) Weight:  [60.8 kg (134 lb)] 60.8 kg (134 lb) (09/15 2147)  P.E. General: WDWN patient in NAD. Psych:  Appropriate mood and affect. Neuro:  A&O x 3, Moving all extremities, sensation intact to light touch HEENT:  EOMs intact Chest:  Even non-labored respirations Skin: ACE bandage removed.  Dressing C/D/I, no rashes or lesions Extremities: warm/dry, mild edema, no erythema or echymosis.  No lymphadenopathy. Pulses: Popliteus 2+ MSK:  ROM: Full ankle ROM, MMT: patient is able to perform a quad set, (-) Homan's    LABS  Recent Labs  09/22/15 0423 09/23/15 0413  HGB 13.3 12.2  WBC 14.3* 8.7  PLT 240 203    Recent Labs  09/22/15 0423  NA 136  K 4.0  CL 103  CO2 26  BUN 8  CREATININE 0.60  GLUCOSE 152*   No results for input(s): LABPT, INR in the last 72 hours.   Assessment/Plan: 2 Days Post-Op Procedure(s) (LRB): LEFT TOTAL KNEE ARTHROPLASTY (Left)  WBAT L LE ACE bandage removed, knee immobilizer with ice reapplied. D/C home today Scripts on chart Plan for outpatient post-op visit with Dr. Shelle IronBeane.  Alfredo MartinezJustin Janiel Crisostomo, PA-C, ATC [facility] Office:  [phone]

## 2015-09-23 NOTE — Progress Notes (Signed)
Physical Therapy Treatment Patient Details Name: [name] MRN: [id number] DOB: 1933-01-19 Today's Date: 09/23/2015    History of Present Illness s/p L TKR with hx of R TKR 1/17    PT Comments    Mod progress with mobility and with improved pain control.  Follow Up Recommendations  Home health PT     Equipment Recommendations  None recommended by PT    Recommendations for Other Services OT consult     Precautions / Restrictions Precautions Precautions: Fall;Knee Required Braces or Orthoses: Knee Immobilizer - Left Knee Immobilizer - Left: Discontinue once straight leg raise with < 10 degree lag Restrictions Weight Bearing Restrictions: No Other Position/Activity Restrictions: WBAT    Mobility  Bed Mobility Overal bed mobility: Needs Assistance Bed Mobility: Supine to Sit     Supine to sit: Min assist     General bed mobility comments: cues for sequence and use of R LE to self assist  Transfers Overall transfer level: Needs assistance Equipment used: Rolling walker (2 wheeled) Transfers: Sit to/from Stand Sit to Stand: Min assist         General transfer comment: steadying assistance; cues for LE placement  Ambulation/Gait Ambulation/Gait assistance: Min assist Ambulation Distance (Feet): 26 Feet Assistive device: Rolling walker (2 wheeled) Gait Pattern/deviations: Step-to pattern;Decreased step length - right;Decreased step length - left;Shuffle;Trunk flexed Gait velocity: decr Gait velocity interpretation: Below normal speed for age/gender General Gait Details: cues for sequence, posture, position from RW.     Stairs            Wheelchair Mobility    Modified Rankin (Stroke Patients Only)       Balance                                    Cognition Arousal/Alertness: Awake/alert Behavior During Therapy: WFL for tasks assessed/performed Overall Cognitive Status: Within Functional Limits for tasks assessed                      Exercises Total Joint Exercises Ankle Circles/Pumps: AROM;Both;Supine;20 reps Quad Sets: AROM;Both;Supine;10 reps Heel Slides: AAROM;Left;10 reps;Supine Straight Leg Raises: AAROM;Left;10 reps;Supine Goniometric ROM: AAROM at L knee -10 - 35    General Comments        Pertinent Vitals/Pain Pain Assessment: 0-10 Pain Score: 4  Pain Location: L knee Pain Descriptors / Indicators: Aching;Sore Pain Intervention(s): Limited activity within patient's tolerance;Monitored during session;Premedicated before session;Ice applied    Home Living                      Prior Function            PT Goals (current goals can now be found in the care plan section) Acute Rehab PT Goals Patient Stated Goal: Regain IND PT Goal Formulation: With patient Time For Goal Achievement: 09/25/15 Potential to Achieve Goals: Good Progress towards PT goals: Progressing toward goals    Frequency    7X/week      PT Plan Current plan remains appropriate    Co-evaluation             End of Session Equipment Utilized During Treatment: Gait belt;Left knee immobilizer Activity Tolerance: Patient limited by fatigue;Patient limited by pain Patient left: in chair;with call bell/phone within reach     Time: 0906-0936 PT Time Calculation (min) (ACUTE ONLY): 30 min  Charges:  $Gait Training: 8-22 mins $  Therapeutic Exercise: 8-22 mins                    G Codes:      [name] 09/23/2015, 1:14 PM

## 2015-09-25 DIAGNOSIS — Z79891 Long term (current) use of opiate analgesic: Secondary | ICD-10-CM | POA: Diagnosis not present

## 2015-09-25 DIAGNOSIS — Z471 Aftercare following joint replacement surgery: Secondary | ICD-10-CM | POA: Diagnosis not present

## 2015-09-25 DIAGNOSIS — Z7901 Long term (current) use of anticoagulants: Secondary | ICD-10-CM | POA: Diagnosis not present

## 2015-09-25 DIAGNOSIS — M4806 Spinal stenosis, lumbar region: Secondary | ICD-10-CM | POA: Diagnosis not present

## 2015-09-25 DIAGNOSIS — Z7982 Long term (current) use of aspirin: Secondary | ICD-10-CM | POA: Diagnosis not present

## 2015-09-25 DIAGNOSIS — Z96653 Presence of artificial knee joint, bilateral: Secondary | ICD-10-CM | POA: Diagnosis not present

## 2015-09-27 ENCOUNTER — Other Ambulatory Visit (HOSPITAL_COMMUNITY): Payer: Self-pay | Admitting: Specialist

## 2015-09-27 ENCOUNTER — Ambulatory Visit (HOSPITAL_COMMUNITY)
Admission: RE | Admit: 2015-09-27 | Discharge: 2015-09-27 | Disposition: A | Discharge status: Home / Self Care | Payer: PPO | Source: Ambulatory Visit | Attending: Internal Medicine | Admitting: Internal Medicine

## 2015-09-27 DIAGNOSIS — I82442 Acute embolism and thrombosis of left tibial vein: Secondary | ICD-10-CM | POA: Insufficient documentation

## 2015-09-27 DIAGNOSIS — I82432 Acute embolism and thrombosis of left popliteal vein: Secondary | ICD-10-CM | POA: Insufficient documentation

## 2015-09-27 DIAGNOSIS — M79605 Pain in left leg: Secondary | ICD-10-CM | POA: Diagnosis not present

## 2015-09-27 DIAGNOSIS — R609 Edema, unspecified: Secondary | ICD-10-CM

## 2015-09-27 DIAGNOSIS — R600 Localized edema: Secondary | ICD-10-CM | POA: Diagnosis not present

## 2015-10-02 DIAGNOSIS — Z7982 Long term (current) use of aspirin: Secondary | ICD-10-CM | POA: Diagnosis not present

## 2015-10-02 DIAGNOSIS — Z471 Aftercare following joint replacement surgery: Secondary | ICD-10-CM | POA: Diagnosis not present

## 2015-10-02 DIAGNOSIS — Z7901 Long term (current) use of anticoagulants: Secondary | ICD-10-CM | POA: Diagnosis not present

## 2015-10-02 DIAGNOSIS — Z79891 Long term (current) use of opiate analgesic: Secondary | ICD-10-CM | POA: Diagnosis not present

## 2015-10-02 DIAGNOSIS — Z96653 Presence of artificial knee joint, bilateral: Secondary | ICD-10-CM | POA: Diagnosis not present

## 2015-10-02 DIAGNOSIS — M4806 Spinal stenosis, lumbar region: Secondary | ICD-10-CM | POA: Diagnosis not present

## 2015-10-05 DIAGNOSIS — Z96652 Presence of left artificial knee joint: Secondary | ICD-10-CM | POA: Diagnosis not present

## 2015-10-09 DIAGNOSIS — M1712 Unilateral primary osteoarthritis, left knee: Secondary | ICD-10-CM | POA: Diagnosis not present

## 2015-10-11 DIAGNOSIS — M1712 Unilateral primary osteoarthritis, left knee: Secondary | ICD-10-CM | POA: Diagnosis not present

## 2015-10-13 DIAGNOSIS — M1712 Unilateral primary osteoarthritis, left knee: Secondary | ICD-10-CM | POA: Diagnosis not present

## 2015-10-16 DIAGNOSIS — M1712 Unilateral primary osteoarthritis, left knee: Secondary | ICD-10-CM | POA: Diagnosis not present

## 2015-10-18 DIAGNOSIS — M1712 Unilateral primary osteoarthritis, left knee: Secondary | ICD-10-CM | POA: Diagnosis not present

## 2015-10-20 DIAGNOSIS — M1711 Unilateral primary osteoarthritis, right knee: Secondary | ICD-10-CM | POA: Diagnosis not present

## 2015-10-23 DIAGNOSIS — M1712 Unilateral primary osteoarthritis, left knee: Secondary | ICD-10-CM | POA: Diagnosis not present

## 2015-10-25 DIAGNOSIS — M1712 Unilateral primary osteoarthritis, left knee: Secondary | ICD-10-CM | POA: Diagnosis not present

## 2015-10-27 DIAGNOSIS — M1711 Unilateral primary osteoarthritis, right knee: Secondary | ICD-10-CM | POA: Diagnosis not present

## 2015-10-30 DIAGNOSIS — M1712 Unilateral primary osteoarthritis, left knee: Secondary | ICD-10-CM | POA: Diagnosis not present

## 2015-11-01 DIAGNOSIS — M1711 Unilateral primary osteoarthritis, right knee: Secondary | ICD-10-CM | POA: Diagnosis not present

## 2015-11-02 DIAGNOSIS — I82402 Acute embolism and thrombosis of unspecified deep veins of left lower extremity: Secondary | ICD-10-CM | POA: Diagnosis not present

## 2015-11-02 DIAGNOSIS — Z96652 Presence of left artificial knee joint: Secondary | ICD-10-CM | POA: Diagnosis not present

## 2015-11-06 DIAGNOSIS — M1712 Unilateral primary osteoarthritis, left knee: Secondary | ICD-10-CM | POA: Diagnosis not present

## 2015-11-09 DIAGNOSIS — M1712 Unilateral primary osteoarthritis, left knee: Secondary | ICD-10-CM | POA: Diagnosis not present

## 2015-11-10 ENCOUNTER — Ambulatory Visit (INDEPENDENT_AMBULATORY_CARE_PROVIDER_SITE_OTHER): Payer: PPO | Admitting: Vascular Surgery

## 2015-11-10 ENCOUNTER — Encounter (INDEPENDENT_AMBULATORY_CARE_PROVIDER_SITE_OTHER): Payer: Self-pay | Admitting: Vascular Surgery

## 2015-11-10 DIAGNOSIS — I82432 Acute embolism and thrombosis of left popliteal vein: Secondary | ICD-10-CM | POA: Diagnosis not present

## 2015-11-10 DIAGNOSIS — E785 Hyperlipidemia, unspecified: Secondary | ICD-10-CM | POA: Insufficient documentation

## 2015-11-10 DIAGNOSIS — I82409 Acute embolism and thrombosis of unspecified deep veins of unspecified lower extremity: Secondary | ICD-10-CM | POA: Insufficient documentation

## 2015-11-10 NOTE — Progress Notes (Signed)
Subjective:    Patient ID: Jasmine PiggMarian T Sanguinetti, female    DOB: 10/10/1933, 82 y.o.   MRN: [id number] Chief Complaint  Patient presents with  . New Patient (Initial Visit)   Presents as a new patient referred by Dr. Shelle IronBeane for a left lower extremity DVT s/p a left total knee replacement. The patient endorses a history of undergoing a left total knee replacement on 09/21/15. About a week later after increasing pain in her calf she underwent a venous duplex which was notable for DVT in her popliteal, posterior tibial and peroneal veins. The patient was started on Xarelto and thigh high compression stockings. Patient reports improvement in her symptoms. Has been undergoing physical therapy and remaining active. Denies any SOB or chest pain.    Review of Systems  Constitutional: Negative.   HENT: Negative.   Eyes: Negative.   Respiratory: Negative.   Cardiovascular: Positive for leg swelling.       Left Lower Extremity DVT  Gastrointestinal: Negative.   Endocrine: Negative.   Genitourinary: Negative.   Musculoskeletal: Negative.   Skin: Negative.   Allergic/Immunologic: Negative.   Neurological: Negative.   Hematological: Negative.   Psychiatric/Behavioral: Negative.       Objective:   Physical Exam  Constitutional: She is oriented to person, place, and time. She appears well-developed and well-nourished.  HENT:  Head: Normocephalic and atraumatic.  Eyes: Conjunctivae and EOM are normal. Pupils are equal, round, and reactive to light.  Neck: Normal range of motion.  Cardiovascular: Normal rate, regular rhythm, normal heart sounds and intact distal pulses.   Pulses:      Radial pulses are 2+ on the right side, and 2+ on the left side.       Dorsalis pedis pulses are 2+ on the right side, and 2+ on the left side.       Posterior tibial pulses are 2+ on the right side, and 2+ on the left side.  Left Lower Extremity: Mild Edema. Non-tender to palpation. No pain with dorsi-flexion.     Pulmonary/Chest: Effort normal and breath sounds normal. No respiratory distress. She has no wheezes.  Abdominal: Soft. Bowel sounds are normal. She exhibits no distension. There is no tenderness. There is no rebound.  Musculoskeletal: Normal range of motion.  Neurological: She is alert and oriented to person, place, and time.  Skin: Skin is warm and dry.  Left total knee incision healing well.   Psychiatric: She has a normal mood and affect. Her behavior is normal. Judgment and thought content normal.   BP 135/76   Pulse 89   Resp 16   Ht 4\' 10"  (1.473 m)   Wt 129 lb (58.5 kg)   BMI 26.96 kg/m   Past Medical History:  Diagnosis Date  . Acute blood loss anemia   . Arthritis   . Benign essential HTN   . Constipation   . GERD (gastroesophageal reflux disease)    infrequently - otc med as needed  . 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 Hyponatremia   . Hypothyroidism   . Leukocytosis   . Primary osteoarthritis of right knee   . Rosacea   . Unsteady gait    Social History   Social History  . Marital status: Widowed    Spouse name: N/A  . Number of children: N/A  . Years of education: N/A   Occupational History  . Not  on file.   Social History Main Topics  . Smoking status: Never Smoker  . Smokeless tobacco: Never Used  . Alcohol use No  . Drug use: No  . Sexual activity: Not on file   Other Topics Concern  . Not on file   Social History Narrative  . No narrative on file   Past Surgical History:  Procedure Laterality Date  . BUNIONECTOMY    . CHOLECYSTECTOMY    . FINGER SURGERY     rt thumb  . TOTAL KNEE ARTHROPLASTY Right 02/02/2015   Procedure: TOTAL RIGHT KNEE ARTHROPLASTY;  Surgeon: Jene EveryJeffrey Beane, MD;  Location: [facility];  Service: Orthopedics;  Laterality: Right;  . TOTAL KNEE ARTHROPLASTY Left 09/21/2015   Procedure: LEFT TOTAL KNEE ARTHROPLASTY;  Surgeon: Jene EveryJeffrey Beane, MD;   Location: [facility];  Service: Orthopedics;  Laterality: Left;  . TUBAL LIGATION     Family History CHF, MI, HTN, CVA  Allergies  Allergen Reactions  . Ampicillin Cough    Has patient had a PCN reaction causing immediate rash, facial/tongue/throat swelling, SOB or lightheadedness with hypotension: No Has patient had a PCN reaction causing severe rash involving mucus membranes or skin necrosis: No Has patient had a PCN reaction that required hospitalization No Has patient had a PCN reaction occurring within the last 10 years: No If all of the above answers are "NO", then may proceed with Cephalosporin use.  . Latex Rash      Assessment & Plan:  Presents as a new patient referred by Dr. Shelle IronBeane for a left lower extremity DVT s/p a left total knee replacement. The patient endorses a history of undergoing a left total knee replacement on 09/21/15. About a week later after increasing pain in her calf she underwent a venous duplex which was notable for DVT in her popliteal, posterior tibial and peroneal veins. The patient was started on Xarelto and thigh high compression stockings. Patient reports improvement in her symptoms. Has been undergoing physical therapy and remaining active. Denies any SOB or chest pain.   1. Acute deep vein thrombosis (DVT) of popliteal vein of left lower extremity (HCC) - Stable Asymptomatic. Continue Xarelto for 3-6 months Continue compression and elevation. Remain active. Will bring patient back in three months (end of dec for repeat DVT study). Patient to seek medical attention if she experiences any SOB or chest pain. She expresses her understanding.   2) Hyperlipidemia - Stable Diet controlled. Encouraged good control as its slows the progression of atherosclerotic disease.  Current Outpatient Prescriptions on File Prior to Visit  Medication Sig Dispense Refill  . levothyroxine (SYNTHROID, LEVOTHROID) 88 MCG tablet Take 88 mcg by mouth daily before breakfast.      . oxyCODONE-acetaminophen (PERCOCET) 5-325 MG tablet Take 1-2 tablets by mouth every 4 (four) hours as needed for severe pain. 50 tablet 0  . Polyethyl Glycol-Propyl Glycol (SYSTANE OP) Apply 1 drop to eye 2 (two) times daily. Both eyes    . aspirin EC 325 MG tablet Take 1 tablet (325 mg total) by mouth 2 (two) times daily after a meal. (Patient not taking: Reported on 11/10/2015) 60 tablet 1  . docusate sodium (COLACE) 100 MG capsule Take 1 capsule (100 mg total) by mouth 2 (two) times daily as needed for mild constipation. (Patient not taking: Reported on 11/10/2015) 30 capsule 1  . polyethylene glycol (MIRALAX / GLYCOLAX) packet Take 17 g by mouth daily. (Patient not taking: Reported on 11/10/2015) 14 each 0   No current facility-administered  medications on file prior to visit.     There are no Patient Instructions on file for this visit. No Follow-up on file.   KIMBERLY A STEGMAYER, PA-C

## 2015-11-13 DIAGNOSIS — M1711 Unilateral primary osteoarthritis, right knee: Secondary | ICD-10-CM | POA: Diagnosis not present

## 2015-11-15 DIAGNOSIS — M1712 Unilateral primary osteoarthritis, left knee: Secondary | ICD-10-CM | POA: Diagnosis not present

## 2015-11-20 DIAGNOSIS — M1712 Unilateral primary osteoarthritis, left knee: Secondary | ICD-10-CM | POA: Diagnosis not present

## 2015-11-22 ENCOUNTER — Other Ambulatory Visit (INDEPENDENT_AMBULATORY_CARE_PROVIDER_SITE_OTHER): Payer: Self-pay | Admitting: Vascular Surgery

## 2015-11-22 NOTE — Telephone Encounter (Signed)
Pt want us to refill her Xarelto 20mg  that was prescribe to her by Dr Shelle IronBeane, but pt was told to call us because we see her for her blood clots. Pt want a call back to discuss if we can refill this medication at [phone]

## 2015-11-22 NOTE — Telephone Encounter (Signed)
Left message for patient to call pharmacy to fax a refill request to be sent to the office

## 2015-11-23 DIAGNOSIS — M1712 Unilateral primary osteoarthritis, left knee: Secondary | ICD-10-CM | POA: Diagnosis not present

## 2015-11-28 DIAGNOSIS — M1712 Unilateral primary osteoarthritis, left knee: Secondary | ICD-10-CM | POA: Diagnosis not present

## 2015-12-05 DIAGNOSIS — M1712 Unilateral primary osteoarthritis, left knee: Secondary | ICD-10-CM | POA: Diagnosis not present

## 2015-12-07 DIAGNOSIS — M1712 Unilateral primary osteoarthritis, left knee: Secondary | ICD-10-CM | POA: Diagnosis not present

## 2015-12-12 DIAGNOSIS — M1712 Unilateral primary osteoarthritis, left knee: Secondary | ICD-10-CM | POA: Diagnosis not present

## 2015-12-20 DIAGNOSIS — M1712 Unilateral primary osteoarthritis, left knee: Secondary | ICD-10-CM | POA: Diagnosis not present

## 2015-12-22 DIAGNOSIS — M1712 Unilateral primary osteoarthritis, left knee: Secondary | ICD-10-CM | POA: Diagnosis not present

## 2015-12-27 ENCOUNTER — Other Ambulatory Visit (INDEPENDENT_AMBULATORY_CARE_PROVIDER_SITE_OTHER): Payer: Self-pay | Admitting: Vascular Surgery

## 2015-12-27 DIAGNOSIS — M1712 Unilateral primary osteoarthritis, left knee: Secondary | ICD-10-CM | POA: Diagnosis not present

## 2015-12-29 DIAGNOSIS — M1712 Unilateral primary osteoarthritis, left knee: Secondary | ICD-10-CM | POA: Diagnosis not present

## 2016-01-02 DIAGNOSIS — M1712 Unilateral primary osteoarthritis, left knee: Secondary | ICD-10-CM | POA: Diagnosis not present

## 2016-01-04 DIAGNOSIS — M1712 Unilateral primary osteoarthritis, left knee: Secondary | ICD-10-CM | POA: Diagnosis not present

## 2016-01-05 ENCOUNTER — Other Ambulatory Visit (INDEPENDENT_AMBULATORY_CARE_PROVIDER_SITE_OTHER): Payer: Self-pay | Admitting: Vascular Surgery

## 2016-01-05 ENCOUNTER — Ambulatory Visit (INDEPENDENT_AMBULATORY_CARE_PROVIDER_SITE_OTHER): Payer: PPO | Admitting: Vascular Surgery

## 2016-01-05 ENCOUNTER — Encounter (INDEPENDENT_AMBULATORY_CARE_PROVIDER_SITE_OTHER): Payer: Self-pay | Admitting: Vascular Surgery

## 2016-01-05 ENCOUNTER — Ambulatory Visit (INDEPENDENT_AMBULATORY_CARE_PROVIDER_SITE_OTHER): Payer: PPO

## 2016-01-05 VITALS — BP 150/67 | HR 81 | Resp 16 | Wt 128.0 lb

## 2016-01-05 DIAGNOSIS — I82432 Acute embolism and thrombosis of left popliteal vein: Secondary | ICD-10-CM

## 2016-01-05 DIAGNOSIS — E785 Hyperlipidemia, unspecified: Secondary | ICD-10-CM | POA: Diagnosis not present

## 2016-01-05 NOTE — Assessment & Plan Note (Signed)
lipid control important in reducing the progression of atherosclerotic disease.   

## 2016-01-05 NOTE — Progress Notes (Signed)
MRN : [id number]  Jari PiggMarian T Hoffman is a 82 y.o. (11/03/1933) female who presents with chief complaint of  Chief Complaint  Patient presents with  . Follow-up  .  History of Present Illness: Patient returns today in follow up of DVT. She was diagnosed after her knee replacement with a DVT in the left popliteal and tibial veins. She has been on anticoagulation for just over 3 months. Her leg swelling and pain has largely resolved. She remains in physical therapy for her knee. She has had no bleeding complications. Her duplex today shows resolution of her left popliteal and tibial vein DVT.  Review of Systems  Constitutional: Negative.   HENT: Negative.   Eyes: Negative.   Respiratory: Negative.   Cardiovascular: Positive for leg swelling.       Left Lower Extremity DVT  Gastrointestinal: Negative.   Endocrine: Negative.   Genitourinary: Negative.   Musculoskeletal: Negative.   Skin: Negative.   Allergic/Immunologic: Negative.   Neurological: Negative.   Hematological: Negative.   Psychiatric/Behavioral: Negative.       Objective:   Physical Exam  Constitutional: She is oriented to person, place, and time. She appears well-developed and well-nourished.  HENT:  Head: Normocephalic and atraumatic.  Eyes: Conjunctivae and EOM are normal. Pupils are equal, round, and reactive to light.  Neck: Normal range of motion.  Cardiovascular: Normal rate, regular rhythm, normal heart sounds and intact distal pulses.   Pulses:      Radial pulses are 2+ on the right side, and 2+ on the left side.       Dorsalis pedis pulses are 2+ on the right side, and 2+ on the left side.       Posterior tibial pulses are 2+ on the right side, and 2+ on the left side.  Left Lower Extremity: Mild Edema. Non-tender to palpation. No pain with dorsi-flexion.   Pulmonary/Chest: Effort normal and breath sounds normal. No respiratory distress. She has no wheezes.  Abdominal: Soft. Bowel sounds are normal.  She exhibits no distension. There is no tenderness. There is no rebound.  Musculoskeletal: Normal range of motion.  Neurological: She is alert and oriented to person, place, and time.  Skin: Skin is warm and dry.  Left total knee incision healing well.   Psychiatric: She has a normal mood and affect. Her behavior is normal. Judgment and thought content normal.   BP 135/76   Pulse 89   Resp 16   Ht 4\' 10"  (1.473 m)   Wt 129 lb (58.5 kg)   BMI 26.96 kg/m       Past Medical History:  Diagnosis Date  . Acute blood loss anemia   . Arthritis   . Benign essential HTN   . Constipation   . GERD (gastroesophageal reflux disease)    infrequently - otc med as needed  . 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 Hyponatremia   . Hypothyroidism   . Leukocytosis   . Primary osteoarthritis of right knee   . Rosacea   . Unsteady gait    Social History        Social History  . Marital status: Widowed    Spouse name: N/A  . Number of children: N/A  . Years of education: N/A      Occupational History  . Not on file.       Social History Main Topics  .  Smoking status: Never Smoker  . Smokeless tobacco: Never Used  . Alcohol use No  . Drug use: No  . Sexual activity: Not on file   Other Topics Concern  . Not on file      Social History Narrative  . No narrative on file        Past Surgical History:  Procedure Laterality Date  . BUNIONECTOMY    . CHOLECYSTECTOMY    . FINGER SURGERY     rt thumb  . TOTAL KNEE ARTHROPLASTY Right 02/02/2015   Procedure: TOTAL RIGHT KNEE ARTHROPLASTY;  Surgeon: Jene EveryJeffrey Beane, MD;  Location: [facility];  Service: Orthopedics;  Laterality: Right;  . TOTAL KNEE ARTHROPLASTY Left 09/21/2015   Procedure: LEFT TOTAL KNEE ARTHROPLASTY;  Surgeon: Jene EveryJeffrey Beane, MD;  Location: [facility];  Service: Orthopedics;  Laterality: Left;  . TUBAL LIGATION      Family History CHF, MI, HTN, CVA       Allergies  Allergen Reactions  . Ampicillin Cough    Has patient had a PCN reaction causing immediate rash, facial/tongue/throat swelling, SOB or lightheadedness with hypotension: No Has patient had a PCN reaction causing severe rash involving mucus membranes or skin necrosis: No Has patient had a PCN reaction that required hospitalization No Has patient had a PCN reaction occurring within the last 10 years: No If all of the above answers are "NO", then may proceed with Cephalosporin use.  . Latex Rash     Assessment/Plan  Hyperlipidemia lipid control important in reducing the progression of atherosclerotic disease.    DVT (deep venous thrombosis) (HCC) The patient has recently completed a 3 month course of anticoagulation for left lower extremity DVT after knee replacement. Her duplex today shows resolution of her left popliteal and tibial vein DVT. 3 months of therapy is appropriate for popliteal and tibial vein DVTs, so I think she can stop her anticoagulation at this point. She should take a 325 mg aspirin daily for the next 90 days and then I'm okay if she switches to a baby aspirin daily. Compression stockings and elevation as needed to avoid postphlebitic symptoms. Return to clinic when necessary.    Festus BarrenJason Dew, MD  01/05/2016 3:11 PM    This note was created with Dragon medical transcription system.  Any errors from dictation are purely unintentional

## 2016-01-05 NOTE — Assessment & Plan Note (Signed)
The patient has recently completed a 3 month course of anticoagulation for left lower extremity DVT after knee replacement. Her duplex today shows resolution of her left popliteal and tibial vein DVT. 3 months of therapy is appropriate for popliteal and tibial vein DVTs, so I think she can stop her anticoagulation at this point. She should take a 325 mg aspirin daily for the next 90 days and then I'm okay if she switches to a baby aspirin daily. Compression stockings and elevation as needed to avoid postphlebitic symptoms. Return to clinic when necessary.

## 2016-01-10 DIAGNOSIS — M1712 Unilateral primary osteoarthritis, left knee: Secondary | ICD-10-CM | POA: Diagnosis not present

## 2016-01-12 DIAGNOSIS — M1712 Unilateral primary osteoarthritis, left knee: Secondary | ICD-10-CM | POA: Diagnosis not present

## 2016-01-15 DIAGNOSIS — M1712 Unilateral primary osteoarthritis, left knee: Secondary | ICD-10-CM | POA: Diagnosis not present

## 2016-01-17 DIAGNOSIS — M1712 Unilateral primary osteoarthritis, left knee: Secondary | ICD-10-CM | POA: Diagnosis not present

## 2016-01-22 DIAGNOSIS — M1712 Unilateral primary osteoarthritis, left knee: Secondary | ICD-10-CM | POA: Diagnosis not present

## 2016-01-29 DIAGNOSIS — M1712 Unilateral primary osteoarthritis, left knee: Secondary | ICD-10-CM | POA: Diagnosis not present

## 2016-01-31 DIAGNOSIS — M1712 Unilateral primary osteoarthritis, left knee: Secondary | ICD-10-CM | POA: Diagnosis not present

## 2016-02-05 DIAGNOSIS — M1712 Unilateral primary osteoarthritis, left knee: Secondary | ICD-10-CM | POA: Diagnosis not present

## 2016-02-07 DIAGNOSIS — M25562 Pain in left knee: Secondary | ICD-10-CM | POA: Diagnosis not present

## 2016-02-16 DIAGNOSIS — M1712 Unilateral primary osteoarthritis, left knee: Secondary | ICD-10-CM | POA: Diagnosis not present

## 2016-02-19 DIAGNOSIS — M1712 Unilateral primary osteoarthritis, left knee: Secondary | ICD-10-CM | POA: Diagnosis not present

## 2016-02-23 DIAGNOSIS — M1712 Unilateral primary osteoarthritis, left knee: Secondary | ICD-10-CM | POA: Diagnosis not present

## 2016-02-27 DIAGNOSIS — M1712 Unilateral primary osteoarthritis, left knee: Secondary | ICD-10-CM | POA: Diagnosis not present

## 2016-02-29 DIAGNOSIS — M1711 Unilateral primary osteoarthritis, right knee: Secondary | ICD-10-CM | POA: Diagnosis not present

## 2016-03-04 DIAGNOSIS — M1712 Unilateral primary osteoarthritis, left knee: Secondary | ICD-10-CM | POA: Diagnosis not present

## 2016-03-06 DIAGNOSIS — M1712 Unilateral primary osteoarthritis, left knee: Secondary | ICD-10-CM | POA: Diagnosis not present

## 2016-03-12 DIAGNOSIS — M1712 Unilateral primary osteoarthritis, left knee: Secondary | ICD-10-CM | POA: Diagnosis not present

## 2016-03-18 DIAGNOSIS — Z96652 Presence of left artificial knee joint: Secondary | ICD-10-CM | POA: Diagnosis not present

## 2016-03-18 DIAGNOSIS — M1711 Unilateral primary osteoarthritis, right knee: Secondary | ICD-10-CM | POA: Diagnosis not present

## 2016-03-18 DIAGNOSIS — Z471 Aftercare following joint replacement surgery: Secondary | ICD-10-CM | POA: Diagnosis not present

## 2016-04-22 DIAGNOSIS — E782 Mixed hyperlipidemia: Secondary | ICD-10-CM | POA: Diagnosis not present

## 2016-04-22 DIAGNOSIS — Z131 Encounter for screening for diabetes mellitus: Secondary | ICD-10-CM | POA: Diagnosis not present

## 2016-04-22 DIAGNOSIS — E039 Hypothyroidism, unspecified: Secondary | ICD-10-CM | POA: Diagnosis not present

## 2016-04-22 DIAGNOSIS — Z0000 Encounter for general adult medical examination without abnormal findings: Secondary | ICD-10-CM | POA: Diagnosis not present

## 2016-05-29 ENCOUNTER — Other Ambulatory Visit: Payer: Self-pay | Admitting: Obstetrics and Gynecology

## 2016-05-29 ENCOUNTER — Other Ambulatory Visit: Payer: Self-pay | Admitting: Family Medicine

## 2016-05-29 DIAGNOSIS — Z1231 Encounter for screening mammogram for malignant neoplasm of breast: Secondary | ICD-10-CM

## 2016-07-01 ENCOUNTER — Ambulatory Visit
Admission: RE | Admit: 2016-07-01 | Discharge: 2016-07-01 | Disposition: A | Discharge status: Home / Self Care | Payer: PPO | Source: Ambulatory Visit | Attending: Family Medicine | Admitting: Family Medicine

## 2016-07-01 DIAGNOSIS — Z1231 Encounter for screening mammogram for malignant neoplasm of breast: Secondary | ICD-10-CM

## 2016-07-03 DIAGNOSIS — H2513 Age-related nuclear cataract, bilateral: Secondary | ICD-10-CM | POA: Diagnosis not present

## 2016-09-12 DIAGNOSIS — Z96652 Presence of left artificial knee joint: Secondary | ICD-10-CM | POA: Diagnosis not present

## 2016-09-12 DIAGNOSIS — Z471 Aftercare following joint replacement surgery: Secondary | ICD-10-CM | POA: Diagnosis not present

## 2016-09-26 DIAGNOSIS — M25662 Stiffness of left knee, not elsewhere classified: Secondary | ICD-10-CM | POA: Diagnosis not present

## 2016-10-02 DIAGNOSIS — M25662 Stiffness of left knee, not elsewhere classified: Secondary | ICD-10-CM | POA: Diagnosis not present

## 2016-10-04 DIAGNOSIS — M25662 Stiffness of left knee, not elsewhere classified: Secondary | ICD-10-CM | POA: Diagnosis not present

## 2016-10-08 DIAGNOSIS — M25662 Stiffness of left knee, not elsewhere classified: Secondary | ICD-10-CM | POA: Diagnosis not present

## 2016-10-10 DIAGNOSIS — M25662 Stiffness of left knee, not elsewhere classified: Secondary | ICD-10-CM | POA: Diagnosis not present

## 2016-10-15 DIAGNOSIS — M25662 Stiffness of left knee, not elsewhere classified: Secondary | ICD-10-CM | POA: Diagnosis not present

## 2016-10-17 DIAGNOSIS — M25662 Stiffness of left knee, not elsewhere classified: Secondary | ICD-10-CM | POA: Diagnosis not present

## 2016-10-22 DIAGNOSIS — M25662 Stiffness of left knee, not elsewhere classified: Secondary | ICD-10-CM | POA: Diagnosis not present

## 2016-10-24 DIAGNOSIS — M25662 Stiffness of left knee, not elsewhere classified: Secondary | ICD-10-CM | POA: Diagnosis not present

## 2016-10-29 DIAGNOSIS — M25662 Stiffness of left knee, not elsewhere classified: Secondary | ICD-10-CM | POA: Diagnosis not present

## 2016-11-01 DIAGNOSIS — M25662 Stiffness of left knee, not elsewhere classified: Secondary | ICD-10-CM | POA: Diagnosis not present

## 2016-11-05 DIAGNOSIS — M25662 Stiffness of left knee, not elsewhere classified: Secondary | ICD-10-CM | POA: Diagnosis not present

## 2016-11-07 DIAGNOSIS — M25662 Stiffness of left knee, not elsewhere classified: Secondary | ICD-10-CM | POA: Diagnosis not present

## 2016-12-25 DIAGNOSIS — Z96652 Presence of left artificial knee joint: Secondary | ICD-10-CM | POA: Diagnosis not present

## 2017-03-12 IMAGING — DX DG KNEE 1-2V*R*
2 series · 2 of 2 positions shown · non-contrast
Comparison: MRI 10/01/2014.

CLINICAL DATA: Total knee replacement.

EXAM:
RIGHT KNEE - 1-2 VIEW

[knee ap bilat standing (1 of 2)]
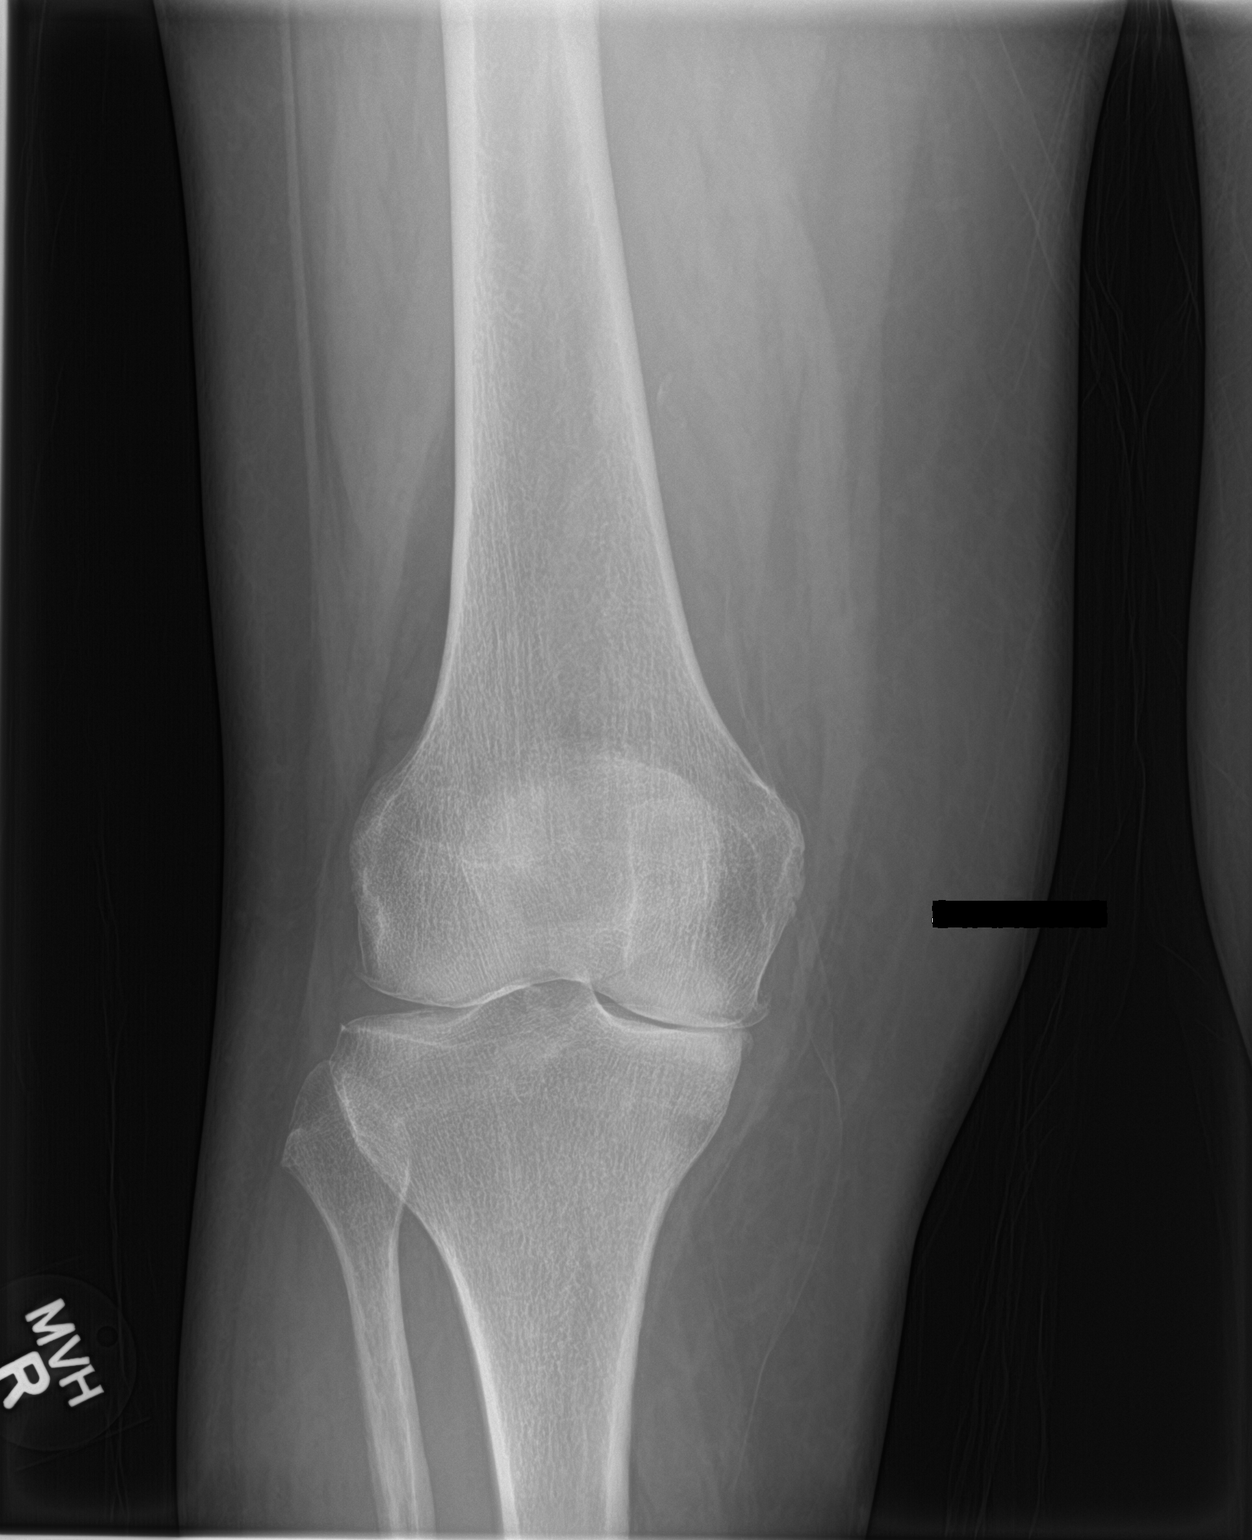

[knee ap bilat standing (2 of 2)]
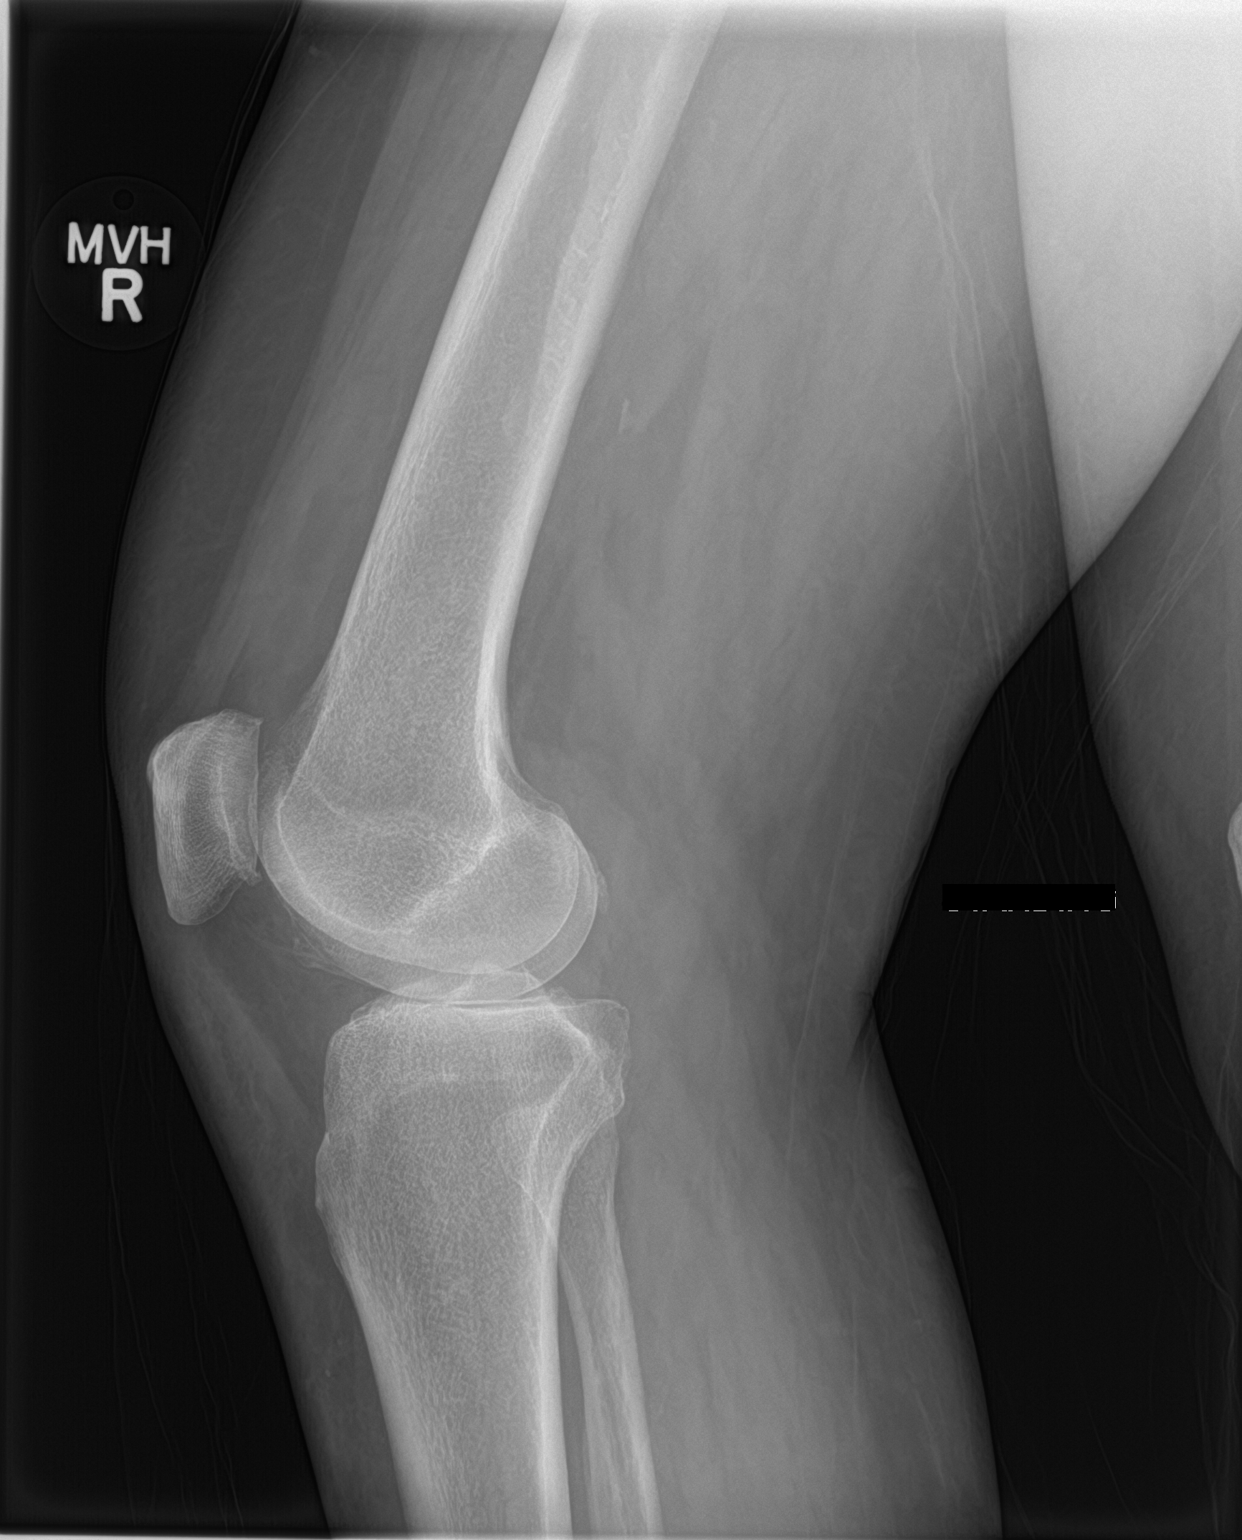

[2 of 2 positions shown; findings below may reference images not displayed]

FINDINGS: Prominent tricompartment degenerative change. No acute bony
abnormality. Small knee joint effusion cannot be excluded .
Peripheral vascular calcification
IMPRESSION: 1. Prominent tricompartment degenerative change. Small knee joint
effusion cannot be excluded. No acute bony abnormality.

2.  Peripheral vascular disease.

## 2017-03-21 IMAGING — DX DG KNEE 1-2V PORT*R*
2 series · 2 of 2 positions shown · non-contrast
Comparison: 01/24/2015

CLINICAL DATA: Status post right total knee replacement.

EXAM:
PORTABLE RIGHT KNEE - 1-2 VIEW

[knee ap]
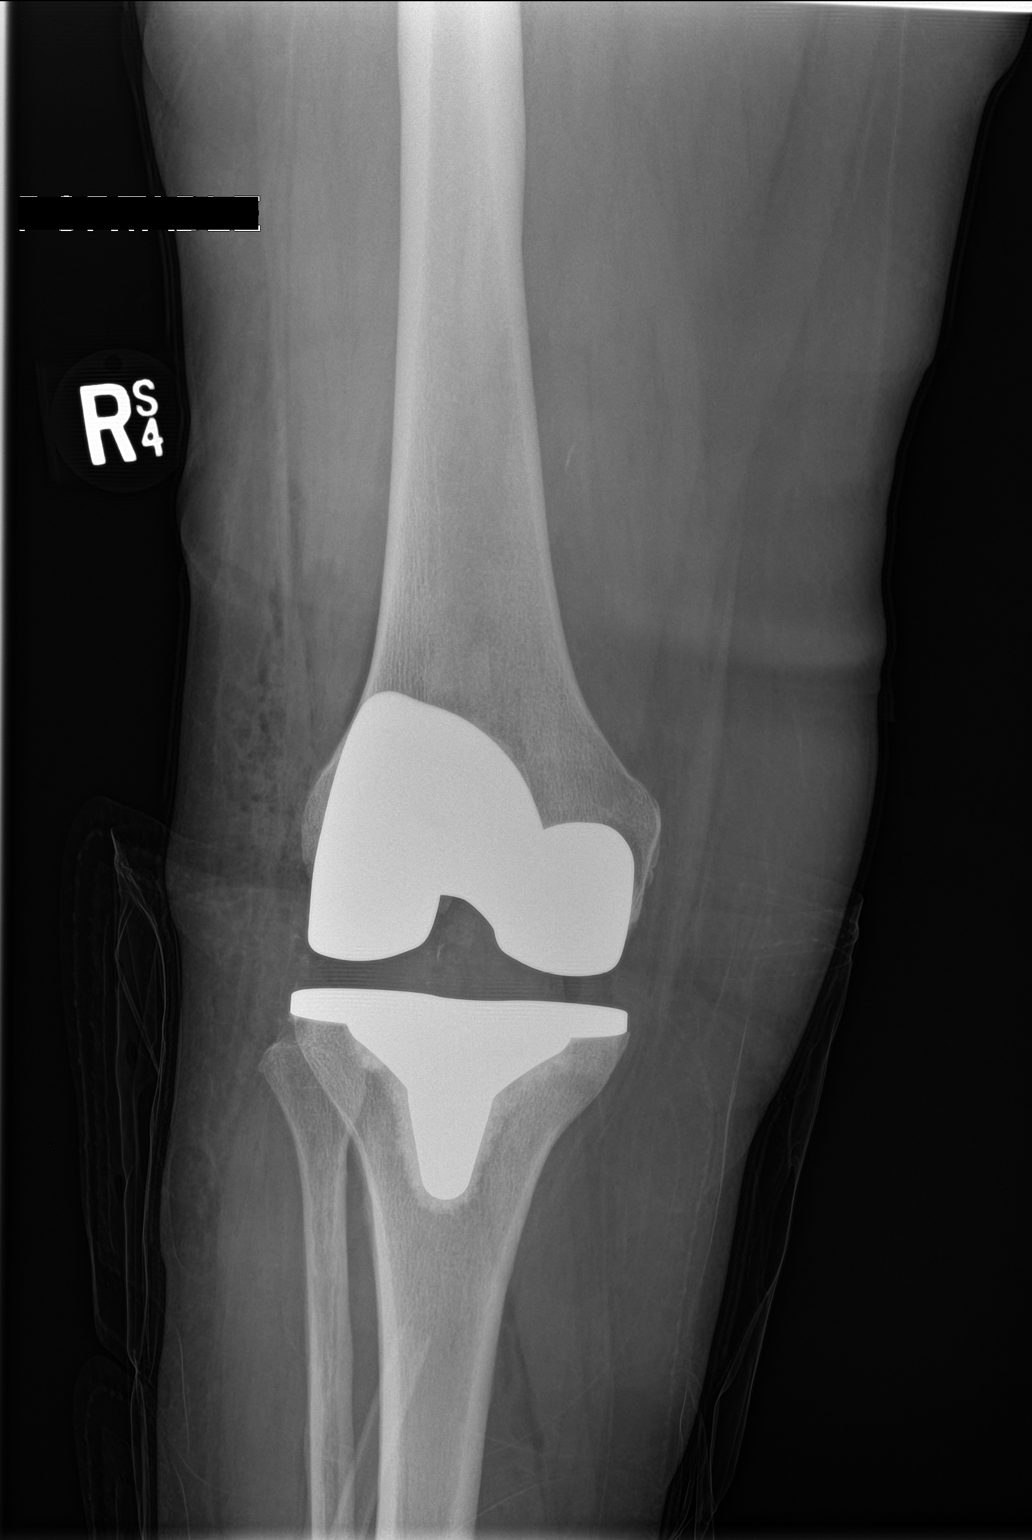

[knee lat]
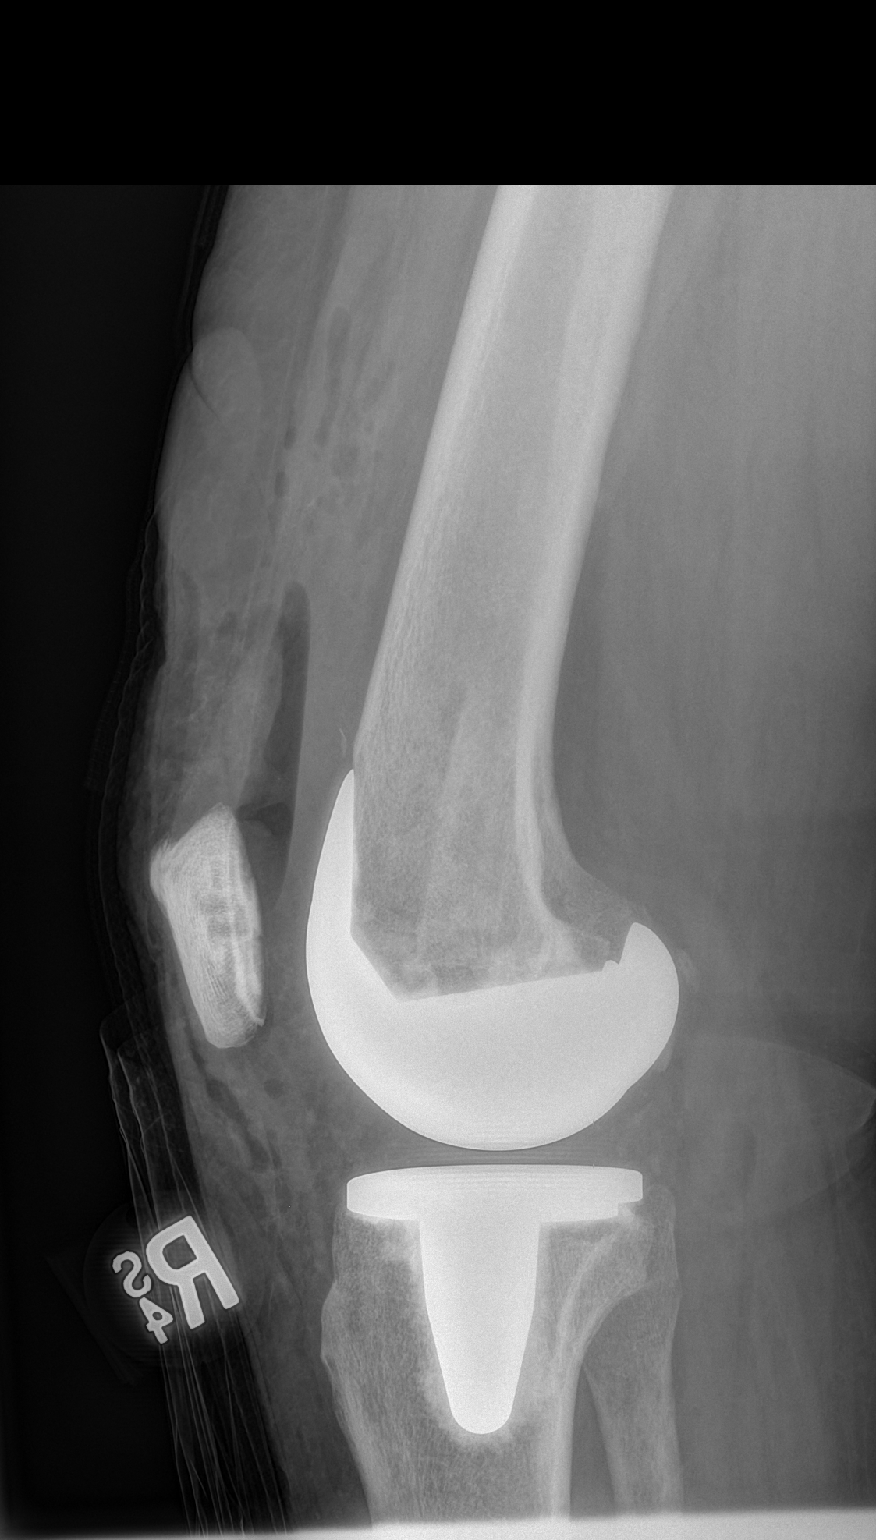

[2 of 2 positions shown; findings below may reference images not displayed]

FINDINGS: A right total knee prosthesis is in place with expected alignment
and appearance. No periprosthetic fracture or other complicating
feature is observed. Expected gas in the joint and soft tissues.
Expected positioning of a polymer component.

There appears to be a tube projecting over the proximal tibial and
fibular shafts. At first glance this resembles the fibula but the
wall thickness is too regular for this to be bone.
IMPRESSION: 1. Total knee prosthesis in place without periprosthetic fracture or
other complicating feature.

## 2017-05-14 DIAGNOSIS — R29890 Loss of height: Secondary | ICD-10-CM | POA: Diagnosis not present

## 2017-05-14 DIAGNOSIS — Z1389 Encounter for screening for other disorder: Secondary | ICD-10-CM | POA: Diagnosis not present

## 2017-05-14 DIAGNOSIS — Z131 Encounter for screening for diabetes mellitus: Secondary | ICD-10-CM | POA: Diagnosis not present

## 2017-05-14 DIAGNOSIS — R413 Other amnesia: Secondary | ICD-10-CM | POA: Diagnosis not present

## 2017-05-14 DIAGNOSIS — Z0000 Encounter for general adult medical examination without abnormal findings: Secondary | ICD-10-CM | POA: Diagnosis not present

## 2017-05-14 DIAGNOSIS — E782 Mixed hyperlipidemia: Secondary | ICD-10-CM | POA: Diagnosis not present

## 2017-05-14 DIAGNOSIS — E039 Hypothyroidism, unspecified: Secondary | ICD-10-CM | POA: Diagnosis not present

## 2017-05-15 ENCOUNTER — Other Ambulatory Visit: Payer: Self-pay | Admitting: Family Medicine

## 2017-05-15 DIAGNOSIS — E2839 Other primary ovarian failure: Secondary | ICD-10-CM

## 2017-05-23 ENCOUNTER — Other Ambulatory Visit: Payer: Self-pay | Admitting: Family Medicine

## 2017-05-23 DIAGNOSIS — Z1231 Encounter for screening mammogram for malignant neoplasm of breast: Secondary | ICD-10-CM

## 2017-07-04 ENCOUNTER — Ambulatory Visit
Admission: RE | Admit: 2017-07-04 | Discharge: 2017-07-04 | Disposition: A | Discharge status: Home / Self Care | Payer: PPO | Source: Ambulatory Visit | Attending: Family Medicine | Admitting: Family Medicine

## 2017-07-04 DIAGNOSIS — Z1231 Encounter for screening mammogram for malignant neoplasm of breast: Secondary | ICD-10-CM

## 2017-07-04 DIAGNOSIS — E2839 Other primary ovarian failure: Secondary | ICD-10-CM

## 2017-07-04 DIAGNOSIS — M85852 Other specified disorders of bone density and structure, left thigh: Secondary | ICD-10-CM | POA: Diagnosis not present

## 2017-07-04 DIAGNOSIS — Z780 Asymptomatic menopausal state: Secondary | ICD-10-CM | POA: Diagnosis not present

## 2017-07-21 ENCOUNTER — Ambulatory Visit (INDEPENDENT_AMBULATORY_CARE_PROVIDER_SITE_OTHER): Payer: PPO | Admitting: Neurology

## 2017-07-21 ENCOUNTER — Encounter: Payer: Self-pay | Admitting: *Deleted

## 2017-07-21 ENCOUNTER — Telehealth: Payer: Self-pay | Admitting: Neurology

## 2017-07-21 ENCOUNTER — Encounter: Payer: Self-pay | Admitting: Neurology

## 2017-07-21 VITALS — BP 135/75 | HR 85 | Ht <= 58 in | Wt 144.0 lb

## 2017-07-21 DIAGNOSIS — G9340 Encephalopathy, unspecified: Secondary | ICD-10-CM | POA: Diagnosis not present

## 2017-07-21 DIAGNOSIS — E538 Deficiency of other specified B group vitamins: Secondary | ICD-10-CM

## 2017-07-21 DIAGNOSIS — R413 Other amnesia: Secondary | ICD-10-CM | POA: Diagnosis not present

## 2017-07-21 NOTE — Progress Notes (Signed)
[facility]    Provider:  Dr Lucia Gaskins Referring Provider: Daisy Floro, MD Primary Care Physician:  Daisy Floro, MD  CC:  Memory changes  HPI:  [name] is a 84 y.o. female here as a referral from Dr. Tenny Craw for memory changes.  Past medical history hypothyroidism, hyperlipidemia, memory changes.HLD, memory changes. Here with daughter who also provides information. Sister has dementia diagnosed in the last few years she is 19, No other family history of dementia. Recently her daughters noticed changes, she went to a sale and couldn't find it but hadn't been there in a long while and her direction skills have never been good so not unusual. Daughter also says her memory worsens after her knee surgeries. She is not sleeping well. More short term memory changes. Little things, she remembers appointments, she is starting to write things down because she stop to stop for milk the other day, she doesn't repeat stories or ask the same questions in the same day. She lives alone, drives, maintains her own hoe, was mowing the yard until her knees started bothering her. She doesn't miss medication, she manage all her own finances, she cooks but not as often because she is alone but she does make a lasagna and cooks for families. Doesn't lose keys or wallet or other things in the home. No confusion she otherwise does not get lost and doesn't get lost going to familiar places.   Reviewed notes, labs and imaging from outside physicians, which showed:  Reviewed referring physician notes, sister has dementia, never smoked, no alcohol, some caffeine, recent memory changes, stays active and eats right, examination was unremarkable. Review of Systems: Patient complains of symptoms per HPI as well as the following symptoms: easy bruising, restless legs. Pertinent negatives and positives per HPI. All others negative.   Social History   Socioeconomic History  . Marital status:  Widowed    Spouse name: Not on file  . Number of children: 2  . Years of education: Not on file  . Highest education level: High school graduate  Occupational History  . Not on file  Social Needs  . Financial resource strain: Not on file  . Food insecurity:    Worry: Not on file    Inability: Not on file  . Transportation needs:    Medical: Not on file    Non-medical: Not on file  Tobacco Use  . Smoking status: Never Smoker  . Smokeless tobacco: Never Used  Substance and Sexual Activity  . Alcohol use: No  . Drug use: No  . Sexual activity: Not on file  Lifestyle  . Physical activity:    Days per week: Not on file    Minutes per session: Not on file  . Stress: Not on file  Relationships  . Social connections:    Talks on phone: Not on file    Gets together: Not on file    Attends religious service: Not on file    Active member of club or organization: Not on file    Attends meetings of clubs or organizations: Not on file    Relationship status: Not on file  . Intimate partner violence:    Fear of current or ex partner: Not on file    Emotionally abused: Not on file    Physically abused: Not on file    Forced sexual activity: Not on file  Other Topics Concern  . Not on file  Social History Narrative  Lives at home alone   Right handed    Family History  Problem Relation Age of Onset  . Stroke Mother        ?  Marland Kitchen. Congestive Heart Failure Father   . Rheum arthritis Sister   . Lung cancer Brother   . Prostate cancer Brother   . Aneurysm Brother        ?  . Lung cancer Brother   . Other Brother        open heart surgery  . Breast cancer Sister   . Dementia Sister   . Fibromyalgia Sister     Past Medical History:  Diagnosis Date  . Acute blood loss anemia   . Arthritis   . Benign essential HTN    pt denies   . Constipation   . GERD (gastroesophageal reflux disease)    infrequently - otc med as needed  . Heart murmur    "leacky heart valve"agrivated  by caffiene / activity - no heart studies have been done per pt  . Hyponatremia   . Hypothyroidism   . Leukocytosis   . MVP (mitral valve prolapse)   . Primary osteoarthritis of right knee   . Rosacea   . Unsteady gait     Past Surgical History:  Procedure Laterality Date  . BUNIONECTOMY    . CHOLECYSTECTOMY    . FINGER SURGERY     rt thumb  . TOTAL KNEE ARTHROPLASTY Right 02/02/2015   Procedure: TOTAL RIGHT KNEE ARTHROPLASTY;  Surgeon: Jene EveryJeffrey Beane, MD;  Location: [facility];  Service: Orthopedics;  Laterality: Right;  . TOTAL KNEE ARTHROPLASTY Left 09/21/2015   Procedure: LEFT TOTAL KNEE ARTHROPLASTY;  Surgeon: Jene EveryJeffrey Beane, MD;  Location: [facility];  Service: Orthopedics;  Laterality: Left;  . TUBAL LIGATION      Current Outpatient Medications  Medication Sig Dispense Refill  . Biotin 1000 MCG tablet Take 1,000 mcg by mouth daily.    . Calcium Carbonate-Vitamin D 600-200 MG-UNIT TABS Take 1 tablet by mouth daily.    . Homeopathic Products (CALENDULA EX) Apply topically.    Marland Kitchen. levothyroxine (SYNTHROID, LEVOTHROID) 88 MCG tablet Take 88 mcg by mouth. Take 1 tablet daily and on Sunday take only 1/2 tablet.    Marland Kitchen. MAGNESIUM PO Take by mouth.    Bertram Gala. Polyethyl Glycol-Propyl Glycol (SYSTANE OP) Apply 1 drop to eye 2 (two) times daily. Both eyes    . Pyridoxine HCl (VITAMIN B-6 PO) Take 1 tablet by mouth daily.    . vitamin C (ASCORBIC ACID) 500 MG tablet Take 500 mg by mouth.     No current facility-administered medications for this visit.     Allergies as of 07/21/2017 - Review Complete 07/21/2017  Allergen Reaction Noted  . Ampicillin Cough 01/20/2015  . Elastic bandages & [zinc]  07/21/2017  . Sulfa antibiotics Cough 07/21/2017  . Latex Rash 09/15/2015    Vitals: BP 135/75 (BP Location: Right Arm, Patient Position: Sitting)   Pulse 85   Ht 4' 9.5" (1.461 m)   Wt 144 lb (65.3 kg)   BMI 30.62 kg/m  Last Weight:  Wt Readings from Last 1 Encounters:  07/21/17 144 lb (65.3 kg)    Last Height:   Ht Readings from Last 1 Encounters:  07/21/17 4' 9.5" (1.461 m)   Physical exam: Exam: Gen: NAD, conversant, well nourised, well groomed                     CV: RRR, no  MRG. No Carotid Bruits. No peripheral edema, warm, nontender Eyes: Conjunctivae clear without exudates or hemorrhage  Neuro: Detailed Neurologic Exam  Speech:    Speech is normal; fluent and spontaneous with normal comprehension.  Cognition:    The patient is oriented to person, place, and time;     recent and remote memory intact;     language fluent;     normal attention, concentration,     fund of knowledge  MMSE - Mini Mental State Exam 07/21/2017  Orientation to time 5  Orientation to Place 5  Registration 3  Attention/ Calculation 5  Recall 3  Language- name 2 objects 2  Language- repeat 1  Language- follow 3 step command 3  Language- read & follow direction 1  Write a sentence 1  Copy design 1  Total score 30    Cranial Nerves:    The pupils are equal, round, and reactive to light.  Attempted fundoscopic exam could not visualize. Visual fields are full to finger confrontation. Extraocular movements are intact. Trigeminal sensation is intact and the muscles of mastication are normal. The face is symmetric. The palate elevates in the midline. Hearing intact. Voice is normal. Shoulder shrug is normal. The tongue has normal motion without fasciculations.   Coordination:    Normal finger to nose and heel to shin. Normal rapid alternating movements.   Gait:    Mild stiffness LE when walking (reports knee pain) no shuffling or ataxia  Motor Observation:    No asymmetry, no atrophy, and no involuntary movements noted. Tone:    Normal muscle tone.    Posture:    Mildly     Strength:    Strength is V/V in the upper and lower limbs.      Sensation: intact to LT     Reflex Exam:  DTR's:    Deep tendon reflexes in the upper and lower extremities are symmetrical bilaterally.    Toes:    The toes are equiv bilaterally.   Clonus:    Clonus is absent.       Assessment/Plan:  [age] year old with likely normal cognitive aging  But needs a thorough workup given some short-term memory loss and Fhx of Dementia in sister.   MRI of the brain to evaluate for reversible causes of dementia Labs today Will clinically follow and suggest a baseline neurocognitive examination  Orders Placed This Encounter  Procedures  . MR BRAIN WO CONTRAST  . B12 and Folate Panel  . Methylmalonic acid, serum  . RPR  . Homocysteine  . Vitamin D, 25-hydroxy  . Ambulatory referral to Neuropsychology     Cc:Dr. Concepcion Living, MD  [facility] [address] [place], [place] [address]  Phone [phone] Fax [phone]

## 2017-07-21 NOTE — Patient Instructions (Signed)
MRI brain Formal Memory Testing Labs     Memory Compensation Strategies  1. Use "WARM" strategy.  W= write it down  A= associate it  R= repeat it  M= make a mental note  2.   You can keep a Glass blower/designerMemory Notebook.  Use a 3-ring notebook with sections for the following: calendar, important names and phone numbers,  medications, doctors' names/phone numbers, lists/reminders, and a section to journal what you did  each day.   3.    Use a calendar to write appointments down.  4.    Write yourself a schedule for the day.  This can be placed on the calendar or in a separate section of the Memory Notebook.  Keeping a  regular schedule can help memory.  5.    Use medication organizer with sections for each day or morning/evening pills.  You may need help loading it  6.    Keep a basket, or pegboard by the door.  Place items that you need to take out with you in the basket or on the pegboard.  You may also want to  include a message board for reminders.  7.    Use sticky notes.  Place sticky notes with reminders in a place where the task is performed.  For example: " turn off the  stove" placed by the stove, "lock the door" placed on the door at eye level, " take your medications" on  the bathroom mirror or by the place where you normally take your medications.  8.    Use alarms/timers.  Use while cooking to remind yourself to check on food or as a reminder to take your medicine, or as a  reminder to make a call, or as a reminder to perform another task, etc.

## 2017-07-21 NOTE — Telephone Encounter (Signed)
Health team order sent to [facility]no auth per their website they will reach out to the pt to schedule.

## 2017-07-22 ENCOUNTER — Encounter: Payer: Self-pay | Admitting: Psychology

## 2017-07-23 LAB — VITAMIN D 25 HYDROXY (VIT D DEFICIENCY, FRACTURES): Vit D, 25-Hydroxy: 26.8 ng/mL — ABNORMAL LOW (ref 30.0–100.0)

## 2017-07-23 LAB — RPR: RPR: NONREACTIVE

## 2017-07-23 LAB — HOMOCYSTEINE: HOMOCYSTEINE: 12.7 umol/L (ref 0.0–15.0)

## 2017-07-23 LAB — METHYLMALONIC ACID, SERUM: Methylmalonic Acid: 259 nmol/L (ref 0–378)

## 2017-07-23 LAB — B12 AND FOLATE PANEL
FOLATE: 8.1 ng/mL (ref >3.0)
VITAMIN B 12: 375 pg/mL (ref 232–1245)

## 2017-07-24 ENCOUNTER — Telehealth: Payer: Self-pay | Admitting: *Deleted

## 2017-07-24 NOTE — Telephone Encounter (Signed)
Called patient. Went over results per Dr. Lucia GaskinsAhern note. She will pick up OTC Vit D supplement 1000-2000units daily. Added to pt med list.  She will call back if she does not hear about scheduling for neuropsych testing as well within 1-2 weeks.

## 2017-07-24 NOTE — Telephone Encounter (Signed)
-----   Message from Anson FretAntonia B Ahern, MD sent at 07/23/2017  5:16 PM EDT ----- Vitamin D is very low, recommend daily Vitamin D supplementation 1000-2000 units daily

## 2017-07-31 ENCOUNTER — Other Ambulatory Visit: Payer: PPO

## 2017-08-07 ENCOUNTER — Ambulatory Visit
Admission: RE | Admit: 2017-08-07 | Discharge: 2017-08-07 | Disposition: A | Discharge status: Home / Self Care | Payer: PPO | Source: Ambulatory Visit | Attending: Neurology | Admitting: Neurology

## 2017-08-07 DIAGNOSIS — G9340 Encephalopathy, unspecified: Secondary | ICD-10-CM

## 2017-08-07 DIAGNOSIS — R413 Other amnesia: Secondary | ICD-10-CM | POA: Diagnosis not present

## 2017-08-18 ENCOUNTER — Telehealth: Payer: Self-pay | Admitting: Neurology

## 2017-08-18 NOTE — Telephone Encounter (Signed)
Spoke to patient. MRI brain was unremarkable for age. But there was a pannus displacing the upper cervical cord. The cord looks normal, reviewed with neuroradiologist. Spoke to patient she is asymptomatic and declines imaging of the cervical spine. Asked her to follow up with me within 3-4 months so we can review the imaging together and discuss. Also advised should she develop new symptoms, weakness, paresthesias, neck pain, bowel or bladder changes, difficulty with gait or any issues to go to ED. Thanks  MRI brain : This MRI of the brain without contrast shows the following: 1.    Mild generalized cortical atrophy.  This is probably in the expected  range for age. 2.    Mild chronic microvascular ischemic changes. 3.    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 4.    There are no acute findings

## 2017-10-07 DIAGNOSIS — B029 Zoster without complications: Secondary | ICD-10-CM

## 2017-10-07 HISTORY — DX: Zoster without complications: B02.9

## 2017-10-14 DIAGNOSIS — M25512 Pain in left shoulder: Secondary | ICD-10-CM | POA: Diagnosis not present

## 2017-10-20 DIAGNOSIS — B029 Zoster without complications: Secondary | ICD-10-CM | POA: Diagnosis not present

## 2017-10-20 DIAGNOSIS — Z79899 Other long term (current) drug therapy: Secondary | ICD-10-CM | POA: Diagnosis not present

## 2017-11-12 DIAGNOSIS — L03114 Cellulitis of left upper limb: Secondary | ICD-10-CM | POA: Diagnosis not present

## 2017-11-12 DIAGNOSIS — B029 Zoster without complications: Secondary | ICD-10-CM | POA: Diagnosis not present

## 2017-11-13 DIAGNOSIS — B0229 Other postherpetic nervous system involvement: Secondary | ICD-10-CM | POA: Diagnosis not present

## 2017-11-13 DIAGNOSIS — M6281 Muscle weakness (generalized): Secondary | ICD-10-CM | POA: Diagnosis not present

## 2017-11-13 DIAGNOSIS — S51819A Laceration without foreign body of unspecified forearm, initial encounter: Secondary | ICD-10-CM | POA: Diagnosis not present

## 2017-12-01 DIAGNOSIS — M25512 Pain in left shoulder: Secondary | ICD-10-CM | POA: Diagnosis not present

## 2017-12-09 ENCOUNTER — Telehealth: Payer: Self-pay | Admitting: Neurology

## 2017-12-09 ENCOUNTER — Other Ambulatory Visit: Payer: Self-pay | Admitting: *Deleted

## 2017-12-09 DIAGNOSIS — R413 Other amnesia: Secondary | ICD-10-CM

## 2017-12-09 NOTE — Telephone Encounter (Signed)
Order placed

## 2017-12-09 NOTE — Telephone Encounter (Signed)
Spoke with pt's daughter Jamesetta So[place]ela [place]l ([place] and advised that since Dr. Daneen SchickBailer is no longer available Dr. Lucia GaskinsAhern can refer pt to Dr. Jimmye Normanodenborough who is local or there is a chance that [facility] can get pt in sooner. She prefers to stay local. Will place referral to Dr. Jimmye Normanodenborough. Rinaldo Cloudamela was appreciative.

## 2017-12-09 NOTE — Telephone Encounter (Signed)
Pts daughter requesting a call at work [phone] ext [phone]. Stating with Dr. Alinda DoomsBailar leaving who would Dr. Lucia GaskinsAhern prefer the pt to see. Please advise.

## 2017-12-12 ENCOUNTER — Telehealth: Payer: Self-pay | Admitting: Neurology

## 2017-12-12 DIAGNOSIS — M6281 Muscle weakness (generalized): Secondary | ICD-10-CM | POA: Diagnosis not present

## 2017-12-12 DIAGNOSIS — B0229 Other postherpetic nervous system involvement: Secondary | ICD-10-CM | POA: Diagnosis not present

## 2017-12-12 NOTE — Telephone Encounter (Signed)
Dr. Tenny Crawoss office requesting a call to discuss the pt coming in for left arm weakness due to recent shingles. Stating they can be reached at [phone]. Please call to discuss.

## 2017-12-12 NOTE — Telephone Encounter (Signed)
Patient had shingles and continues to have pain. This is not acute, she has had it for months and already had PT. Told them we would reach out to her to get her scheduled in the next few weeks, their office confirmed this is not urgent thanks

## 2017-12-15 NOTE — Telephone Encounter (Signed)
Called pt's daughter and LVM. Advised not an emergency but asked for call back as soon as she can. Left office number in message.

## 2017-12-15 NOTE — Telephone Encounter (Signed)
Spoke with Pam pt's daughter. Discussed appt scheduled for wed 12/11 @ 3:30 pm for L arm weakness. Discussed directions to office. Her questions were answered. She verbalized appreciation and is aware of check in time 3:00 to 3:15. She will be bringing pt to appt.

## 2017-12-15 NOTE — Telephone Encounter (Signed)
Spoke with pt and offered appt tomorrow 11 AM. This did not work for her. Offered 3:30 appt on Wed 12/11, arrival 3:00-3:15. Pt verbalized appreciation. She stated her daughter Elita Quickam would be coming with her. RN will call Pam to discuss appt and directions to office. Pt appreciative.

## 2017-12-15 NOTE — Telephone Encounter (Signed)
Patient's daughter Elita Quickam returning a call.

## 2017-12-17 ENCOUNTER — Encounter: Payer: Self-pay | Admitting: *Deleted

## 2017-12-17 ENCOUNTER — Encounter: Payer: Self-pay | Admitting: Neurology

## 2017-12-17 ENCOUNTER — Ambulatory Visit (INDEPENDENT_AMBULATORY_CARE_PROVIDER_SITE_OTHER): Payer: PPO | Admitting: Neurology

## 2017-12-17 VITALS — BP 138/77 | HR 89 | Ht <= 58 in | Wt 139.0 lb

## 2017-12-17 DIAGNOSIS — R29898 Other symptoms and signs involving the musculoskeletal system: Secondary | ICD-10-CM

## 2017-12-17 DIAGNOSIS — G540 Brachial plexus disorders: Secondary | ICD-10-CM

## 2017-12-17 DIAGNOSIS — G541 Lumbosacral plexus disorders: Secondary | ICD-10-CM

## 2017-12-17 DIAGNOSIS — G839 Paralytic syndrome, unspecified: Secondary | ICD-10-CM

## 2017-12-17 DIAGNOSIS — M79602 Pain in left arm: Secondary | ICD-10-CM | POA: Diagnosis not present

## 2017-12-17 DIAGNOSIS — B349 Viral infection, unspecified: Secondary | ICD-10-CM | POA: Diagnosis not present

## 2017-12-17 DIAGNOSIS — G373 Acute transverse myelitis in demyelinating disease of central nervous system: Secondary | ICD-10-CM | POA: Diagnosis not present

## 2017-12-17 DIAGNOSIS — G8389 Other specified paralytic syndromes: Secondary | ICD-10-CM | POA: Diagnosis not present

## 2017-12-17 NOTE — Progress Notes (Signed)
[facility]    Provider:  Dr Lucia Gaskins Referring Provider: Daisy Floro, MD Primary Care Physician:  Daisy Floro, MD  CC:  Acute weakness left arm  Interval history 12/17/2017: 84 year old with shingles and now arm weakness. Shingles 10/17/2017, she was treated, went to physical therapy. Her arm hurts all the time and she has no strength. She can't use her arm now. Here with daughter. Developed the rash going from the outer arm to the several first digits and the forearm. Now numb. She went to physical therapy once and therapist recommended neurology as well.   HPI:  [name] is a 84 y.o. female here as a referral from Dr. Tenny Craw for memory changes.  Past medical history hypothyroidism, hyperlipidemia, memory changes.HLD, memory changes. Here with daughter who also provides information. Sister has dementia diagnosed in the last few years she is 26, No other family history of dementia. Recently her daughters noticed changes, she went to a sale and couldn't find it but hadn't been there in a long while and her direction skills have never been good so not unusual. Daughter also says her memory worsens after her knee surgeries. She is not sleeping well. More short term memory changes. Little things, she remembers appointments, she is starting to write things down because she stop to stop for milk the other day, she doesn't repeat stories or ask the same questions in the same day. She lives alone, drives, maintains her own hoe, was mowing the yard until her knees started bothering her. She doesn't miss medication, she manage all her own finances, she cooks but not as often because she is alone but she does make a lasagna and cooks for families. Doesn't lose keys or wallet or other things in the home. No confusion she otherwise does not get lost and doesn't get lost going to familiar places.   Reviewed notes, labs and imaging from outside physicians, which  showed:  Reviewed referring physician notes, sister has dementia, never smoked, no alcohol, some caffeine, recent memory changes, stays active and eats right, examination was unremarkable. Review of Systems: Patient complains of symptoms per HPI as well as the following symptoms: easy bruising, restless legs. Pertinent negatives and positives per HPI. All others negative.   Social History   Socioeconomic History  . Marital status: Widowed    Spouse name: Not on file  . Number of children: 2  . Years of education: Not on file  . Highest education level: High school graduate  Occupational History  . Not on file  Social Needs  . Financial resource strain: Not on file  . Food insecurity:    Worry: Not on file    Inability: Not on file  . Transportation needs:    Medical: Not on file    Non-medical: Not on file  Tobacco Use  . Smoking status: Never Smoker  . Smokeless tobacco: Never Used  Substance and Sexual Activity  . Alcohol use: No  . Drug use: No  . Sexual activity: Not on file  Lifestyle  . Physical activity:    Days per week: Not on file    Minutes per session: Not on file  . Stress: Not on file  Relationships  . Social connections:    Talks on phone: Not on file    Gets together: Not on file    Attends religious service: Not on file    Active member of club or organization: Not on file  Attends meetings of clubs or organizations: Not on file    Relationship status: Not on file  . Intimate partner violence:    Fear of current or ex partner: Not on file    Emotionally abused: Not on file    Physically abused: Not on file    Forced sexual activity: Not on file  Other Topics Concern  . Not on file  Social History Narrative   Lives at home alone   Right handed    Family History  Problem Relation Age of Onset  . Stroke Mother        ?  Marland Kitchen Congestive Heart Failure Father   . Rheum arthritis Sister   . Lung cancer Brother   . Prostate cancer Brother   .  Aneurysm Brother        ?  . Lung cancer Brother   . Other Brother        open heart surgery  . Breast cancer Sister   . Dementia Sister   . Fibromyalgia Sister     Past Medical History:  Diagnosis Date  . Acute blood loss anemia   . Arthritis   . Benign essential HTN    pt denies   . Constipation   . GERD (gastroesophageal reflux disease)    infrequently - otc med as needed  . 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 Hyponatremia   . Hypothyroidism   . Leukocytosis   . MVP (mitral valve prolapse)   . Primary osteoarthritis of right knee   . Rosacea   . Shingles 10/2017  . Unsteady gait     Past Surgical History:  Procedure Laterality Date  . BUNIONECTOMY    . CHOLECYSTECTOMY    . COLONOSCOPY     2004 & 06/17/12  . FINGER SURGERY     rt thumb  . TOTAL KNEE ARTHROPLASTY Right 02/02/2015   Procedure: TOTAL RIGHT KNEE ARTHROPLASTY;  Surgeon: Jene Every, MD;  Location: [facility];  Service: Orthopedics;  Laterality: Right;  . TOTAL KNEE ARTHROPLASTY Left 09/21/2015   Procedure: LEFT TOTAL KNEE ARTHROPLASTY;  Surgeon: Jene Every, MD;  Location: [facility];  Service: Orthopedics;  Laterality: Left;  . TUBAL LIGATION      Current Outpatient Medications  Medication Sig Dispense Refill  . Biotin 1000 MCG tablet Take 1,000 mcg by mouth daily.    . Calcium Carbonate-Vitamin D 600-200 MG-UNIT TABS Take 1 tablet by mouth daily.    . Cholecalciferol (VITAMIN D PO) Take 1,000-2,000 Units by mouth daily.    Marland Kitchen gabapentin (NEURONTIN) 300 MG capsule Take 300-600 mg by mouth at bedtime.    . Homeopathic Products (CALENDULA EX) Apply topically.    Marland Kitchen levothyroxine (SYNTHROID, LEVOTHROID) 88 MCG tablet Take 88 mcg by mouth. Take 1 tablet daily and on Sunday take only 1/2 tablet.    . lidocaine (LIDODERM) 5 % Place 1-2 patches onto the skin daily. Remove & Discard patch within 12 hours or as directed by MD    .  MAGNESIUM PO Take 1 tablet by mouth daily.     Bertram Gala Glycol-Propyl Glycol (SYSTANE OP) Apply 1 drop to eye 2 (two) times daily. Both eyes    . Pyridoxine HCl (VITAMIN B-6 PO) Take 1 tablet by mouth daily.    . traMADol (ULTRAM) 50 MG tablet Take 50 mg by mouth 3 (three) times daily as needed.    Marland Kitchen  vitamin C (ASCORBIC ACID) 500 MG tablet Take 500 mg by mouth.     No current facility-administered medications for this visit.     Allergies as of 12/17/2017 - Review Complete 12/17/2017  Allergen Reaction Noted  . Ampicillin Cough 01/20/2015  . Elastic bandages & [zinc]  07/21/2017  . Sulfa antibiotics Cough 07/21/2017  . Latex Rash 09/15/2015    Vitals: BP 138/77 (BP Location: Right Arm, Patient Position: Sitting)   Pulse 89   Ht 4\' 9"  (1.448 m)   Wt 139 lb (63 kg)   BMI 30.08 kg/m  Last Weight:  Wt Readings from Last 1 Encounters:  12/17/17 139 lb (63 kg)   Last Height:   Ht Readings from Last 1 Encounters:  12/17/17 4\' 9"  (1.448 m)   Physical exam: Exam: Gen: NAD, conversant, well nourised, well groomed                     CV: RRR, no MRG. No Carotid Bruits. No peripheral edema, warm, nontender Eyes: Conjunctivae clear without exudates or hemorrhage  Neuro: Detailed Neurologic Exam  Speech:    Speech is normal; fluent and spontaneous with normal comprehension.  Cognition:    The patient is oriented to person, place, and time;     recent and remote memory intact;     language fluent;     normal attention, concentration,     fund of knowledge  MMSE - Mini Mental State Exam 07/21/2017  Orientation to time 5  Orientation to Place 5  Registration 3  Attention/ Calculation 5  Recall 3  Language- name 2 objects 2  Language- repeat 1  Language- follow 3 step command 3  Language- read & follow direction 1  Write a sentence 1  Copy design 1  Total score 30    Cranial Nerves:    The pupils are equal, round, and reactive to light.  Attempted fundoscopic exam  could not visualize. Visual fields are full to finger confrontation. Extraocular movements are intact. Trigeminal sensation is intact and the muscles of mastication are normal. The face is symmetric. The palate elevates in the midline. Hearing intact. Voice is normal. Shoulder shrug is normal. The tongue has normal motion without fasciculations.   Coordination:    Normal finger to nose and heel to shin. Normal rapid alternating movements.   Gait:    Mild stiffness LE when walking (reports knee pain) no shuffling or ataxia  Motor Observation:    No asymmetry, no atrophy, and no involuntary movements noted. Tone:    Decreased left arm muscle tone.    Posture:    Mildly     Strength: delts 2/5, triceps and biceps 3/5, pronation 4/5, 4+ finger extensorts       Sensation: decreased left arm     Reflex Exam:  DTR's:    Deep tendon reflexes in the upper and lower extremities are symmetrical bilaterally. Left biceps reflex possibly brisker.   Toes:    The toes are equiv bilaterally.   Clonus:    Clonus is absent.       Assessment/Plan:  [age] year old with acute left arm weakness in the setting of Herpes Zoster. This is likely Herpes Zoster affecting the motor nerves, rare but well documented. Prognosis is good with PT. However Herpes Zoster can cause Brachial Plexopathy need MRI Chest w/wo contrast. Also can cause transvere myelitis which acutely may present only with lower motor neuron signs such as weakness, need MRI cervical  spine    Acute weakness left arm and pain with radiation down the arm as well as numbness and tingling. Has weakness in multiple nerve root distributions:  MRI cervical spine to llook for stenosis and myelopathy vs Zoster-related complications such as tranverse myelitis which can present acutely with only lower motor neuron signs MRI chest for zoster-related brachial plexopathy vs radiculoplexus neuropathy emg/ncs  Orders Placed This Encounter   Procedures  . MR CERVICAL SPINE W WO CONTRAST  . MR CHEST W WO CONTRAST  . NCV with EMG(electromyography)   Cc:Dr. Tenny Crawoss   A total of 45 minutes was spent face-to-face with this patient. Over half this time was spent on counseling patient on the  1. Left arm weakness   2. Acute viral transverse myelitis (HCC)   3. Left arm pain   4. Flaccid paralysis (HCC)   5. Brachial plexopathy   6. Radiculoplexus neuropathy    diagnosis and different diagnostic and therapeutic options, counseling and coordination of care, risks ans benefits of management, compliance, or risk factor reduction and education.     Naomie DeanAntonia Nita Whitmire, MD  [facility] [address] [place], [place] [id number]  Phone [phone] Fax [phone]

## 2017-12-17 NOTE — Patient Instructions (Signed)
MRI chest MRI cervical spine Physical therapy   Shingles Shingles, which is also known as herpes zoster, is an infection that causes a painful skin rash and fluid-filled blisters. Shingles is not related to genital herpes, which is a sexually transmitted infection. Shingles only develops in people who:  Have had chickenpox.  Have received the chickenpox vaccine. (This is rare.)  What are the causes? Shingles is caused by varicella-zoster virus (VZV). This is the same virus that causes chickenpox. After exposure to VZV, the virus stays in the body in an inactive (dormant) state. Shingles develops if the virus reactivates. This can happen many years after the initial exposure to VZV. It is not known what causes this virus to reactivate. What increases the risk? People who have had chickenpox or received the chickenpox vaccine are at risk for shingles. Infection is more common in people who:  Are older than age 82.  Have a weakened defense (immune) system, such as those with HIV, AIDS, or cancer.  Are taking medicines that weaken the immune system, such as transplant medicines.  Are under great stress.  What are the signs or symptoms? Early symptoms of this condition include itching, tingling, and pain in an area on your skin. Pain may be described as burning, stabbing, or throbbing. A few days or weeks after symptoms start, a painful red rash appears, usually on one side of the body in a bandlike or beltlike pattern. The rash eventually turns into fluid-filled blisters that break open, scab over, and dry up in about 2-3 weeks. At any time during the infection, you may also develop:  A fever.  Chills.  A headache.  An upset stomach.  How is this diagnosed? This condition is diagnosed with a skin exam. Sometimes, skin or fluid samples are taken from the blisters before a diagnosis is made. These samples are examined under a microscope or sent to a lab for testing. How is this  treated? There is no specific cure for this condition. Your health care provider will probably prescribe medicines to help you manage pain, recover more quickly, and avoid long-term problems. Medicines may include:  Antiviral drugs.  Anti-inflammatory drugs.  Pain medicines.  If the area involved is on your face, you may be referred to a specialist, such as an eye doctor (ophthalmologist) or an ear, nose, and throat (ENT) doctor to help you avoid eye problems, chronic pain, or disability. Follow these instructions at home: Medicines  Take medicines only as directed by your health care provider.  Apply an anti-itch or numbing cream to the affected area as directed by your health care provider. Blister and Rash Care  Take a cool bath or apply cool compresses to the area of the rash or blisters as directed by your health care provider. This may help with pain and itching.  Keep your rash covered with a loose bandage (dressing). Wear loose-fitting clothing to help ease the pain of material rubbing against the rash.  Keep your rash and blisters clean with mild soap and cool water or as directed by your health care provider.  Check your rash every day for signs of infection. These include redness, swelling, and pain that lasts or increases.  Do not pick your blisters.  Do not scratch your rash. General instructions  Rest as directed by your health care provider.  Keep all follow-up visits as directed by your health care provider. This is important.  Until your blisters scab over, your infection can cause chickenpox in  people who have never had it or been vaccinated against it. To prevent this from happening, avoid contact with other people, especially: ? Babies. ? Pregnant women. ? Children who have eczema. ? Elderly people who have transplants. ? People who have chronic illnesses, such as leukemia or AIDS. Contact a health care provider if:  Your pain is not relieved with  prescribed medicines.  Your pain does not get better after the rash heals.  Your rash looks infected. Signs of infection include redness, swelling, and pain that lasts or increases. Get help right away if:  The rash is on your face or nose.  You have facial pain, pain around your eye area, or loss of feeling on one side of your face.  You have ear pain or you have ringing in your ear.  You have loss of taste.  Your condition gets worse. This information is not intended to replace advice given to you by your health care provider. Make sure you discuss any questions you have with your health care provider. Document Released: 12/24/2004 Document Revised: 08/20/2015 Document Reviewed: 11/04/2013 Elsevier Interactive Patient Education  2018 [employer].

## 2017-12-18 ENCOUNTER — Telehealth: Payer: Self-pay | Admitting: Neurology

## 2017-12-18 NOTE — Telephone Encounter (Signed)
FYI Pt has called so Dr Lucia GaskinsAhern can be made aware that her PT [facility] 4[facility].  Pt is not asking for a call back

## 2017-12-18 NOTE — Telephone Encounter (Signed)
Noted thank you

## 2017-12-22 ENCOUNTER — Telehealth: Payer: Self-pay | Admitting: Neurology

## 2017-12-22 NOTE — Telephone Encounter (Signed)
noted 

## 2017-12-22 NOTE — Telephone Encounter (Signed)
She is seeing Hormel FoodsKrista Eherpsb. Left my cellphone for her to call me. Explained I think this is motor involvement due to zoster. She can go forward with PT at this time but want to discuss

## 2017-12-22 NOTE — Telephone Encounter (Signed)
lvm for pt to be aware. I gave her GI phone number of [phone] and to give them a call if she has not heard in the next 2-3 business days.  °

## 2017-12-22 NOTE — Telephone Encounter (Signed)
Health team order sent to [facility]. No auth they will reach out to the pt to schedule.  °

## 2017-12-23 DIAGNOSIS — M25512 Pain in left shoulder: Secondary | ICD-10-CM | POA: Diagnosis not present

## 2017-12-23 NOTE — Telephone Encounter (Signed)
I spoke to [place], she also thought her weaknes may be due to motor involvement from zoster. Discussed plan. Patient to be seen today.

## 2018-01-03 ENCOUNTER — Ambulatory Visit
Admission: RE | Admit: 2018-01-03 | Discharge: 2018-01-03 | Disposition: A | Discharge status: Home / Self Care | Payer: PPO | Source: Ambulatory Visit | Attending: Neurology | Admitting: Neurology

## 2018-01-03 DIAGNOSIS — G8389 Other specified paralytic syndromes: Secondary | ICD-10-CM

## 2018-01-03 DIAGNOSIS — R29898 Other symptoms and signs involving the musculoskeletal system: Secondary | ICD-10-CM

## 2018-01-03 DIAGNOSIS — G541 Lumbosacral plexus disorders: Secondary | ICD-10-CM

## 2018-01-03 DIAGNOSIS — G540 Brachial plexus disorders: Secondary | ICD-10-CM | POA: Diagnosis not present

## 2018-01-03 DIAGNOSIS — M79602 Pain in left arm: Secondary | ICD-10-CM

## 2018-01-03 DIAGNOSIS — G839 Paralytic syndrome, unspecified: Secondary | ICD-10-CM

## 2018-01-03 DIAGNOSIS — G373 Acute transverse myelitis in demyelinating disease of central nervous system: Secondary | ICD-10-CM

## 2018-01-03 DIAGNOSIS — B349 Viral infection, unspecified: Secondary | ICD-10-CM

## 2018-01-03 MED ORDER — GADOBENATE DIMEGLUMINE 529 MG/ML IV SOLN
12.0000 mL | Freq: Once | INTRAVENOUS | Status: AC | PRN
  Administered 2018-01-03: 12 mL via INTRAVENOUS

## 2018-01-05 DIAGNOSIS — M25512 Pain in left shoulder: Secondary | ICD-10-CM | POA: Diagnosis not present

## 2018-01-08 ENCOUNTER — Telehealth: Payer: Self-pay | Admitting: *Deleted

## 2018-01-08 ENCOUNTER — Telehealth: Payer: Self-pay | Admitting: Diagnostic Neuroimaging

## 2018-01-08 NOTE — Progress Notes (Signed)
Spoke with Tabitha at [facility]. She will have one of the radiologists read it.

## 2018-01-08 NOTE — Telephone Encounter (Signed)
Reviewed MRI brachial plexus and cervical spine results --> complicated zoster with involvement of brachial plexus, cervical spinal cord and shoulder muscles (with enhancement)    PLAN: - check labs (CBC, CMP) - start IV acyclovir 10mg /kg every 8 hours x 14 days - start methylprednisolone 1000mg  IV x 5 days - will offer to admit patient to hospital for treatment vs outpatient home IV therapy   Suanne Marker, MD 01/08/2018, 5:33 PM Certified in Neurology, Neurophysiology and Neuroimaging  [facility] [address], [address] [place], [place] [address] [phone]

## 2018-01-08 NOTE — Telephone Encounter (Signed)
Per Dr. Marjory Lies, pt's scans do show signs of inflammation and that the shingles has affected her nerves. He would like to treat with IV Acyclovir given via home health. Will try to order tomorrow. Called pt's daughter Elita Quick (on [place]), left message (no details) informing her I would call tomorrow and left office number in message. Called pt and spoke with her. She verbalized understanding of the results and the plan. Advised pt that this is not an emergency however we will try to get the process started tomorrow. Pt will pass the message along to her daughter tonight. RN stated she would try to call [facility] tomorrow. Pt stated that the pain and the decreased movement are both pretty much still the same as they have been.  Per Dr. Marjory Lies, may consider admission for the IV treatment if home health unable to initiate tomorrow.

## 2018-01-08 NOTE — Telephone Encounter (Signed)
-----   Message from Anson Fret, MD sent at 01/08/2018 11:04 AM EST ----- Toma Copier, this imaging was completed on 12/28 and no one has read it. Its probably a mistake, [facility] didn't read it because they thought we would - bu [facility] does not read chest films. Can you discuss with Irving Burton or someone to call over there and get it read? thanks  ----- Message ----- From: SYSTEM Sent: 01/08/2018  12:08 AM EST To: Anson Fret, MD

## 2018-01-09 ENCOUNTER — Other Ambulatory Visit: Payer: Self-pay | Admitting: *Deleted

## 2018-01-09 ENCOUNTER — Telehealth: Payer: Self-pay | Admitting: Neurology

## 2018-01-09 ENCOUNTER — Other Ambulatory Visit: Payer: Self-pay | Admitting: Neurology

## 2018-01-09 DIAGNOSIS — B028 Zoster with other complications: Secondary | ICD-10-CM

## 2018-01-09 DIAGNOSIS — B0112 Varicella myelitis: Secondary | ICD-10-CM

## 2018-01-09 DIAGNOSIS — Z792 Long term (current) use of antibiotics: Secondary | ICD-10-CM

## 2018-01-09 DIAGNOSIS — M25512 Pain in left shoulder: Secondary | ICD-10-CM | POA: Diagnosis not present

## 2018-01-09 NOTE — Telephone Encounter (Signed)
Lab orders for CBC w/ diff & CMP signed by Dr. Demetrius Charity and faxed to [facility]. Received a receipt of confirmation.

## 2018-01-09 NOTE — Telephone Encounter (Signed)
I called [name], discussed about placement of the PICC line, prognosis for recovery.  If the majority of the injury was a crossover effect of the motor aspects of the nerve, the prognosis for healing may be fairly good, not clear how much the spinal cord involvement is adding to the clinical deficits.  Motor crossover with the shingles outbreak is much more common in a transverse myelitis.

## 2018-01-09 NOTE — Telephone Encounter (Signed)
Spoke with [name] @ [facility] [place]. She has received all of the additional information requested. She is aware that picc line order was placed, labs are pending and we will call them on Monday to give final ok before Acyclovir is started. They stated they will get the process started and verify insurance coverage so they can anticipate a potential start of therapy via PIV (until PICC placed) around 2-3 pm on Monday 01/12/2018 after they get the "ok" from our office.

## 2018-01-09 NOTE — Telephone Encounter (Signed)
Pts daughter called requesting results of her mothers MRI. Please advise.

## 2018-01-09 NOTE — Telephone Encounter (Signed)
Spoke with [facility] pharmacy. They stated that a PICC line would be needed and daughter will need to be trained. They also need copies of ins card, office note, med profile, and daughters name faxed to [facility]. Dr. Anne Hahn aware. He authorized an order for PICC line to be placed at [facility] radiology however stated that in the meantime the medication can be started via PIV.

## 2018-01-09 NOTE — Telephone Encounter (Signed)
Noted, will call back this morning and document in other phone note.

## 2018-01-09 NOTE — Telephone Encounter (Signed)
[facility] can initiate within 24-48 hours. Per Dr.Penumalli will consult them. Pt had CMP done at Dr. Charlott Rakes office on 10/20/17 (per his office) and they will fax the results to our office this morning.

## 2018-01-09 NOTE — Telephone Encounter (Signed)
Spoke with Tiffany at @ [facility] to inquire if they administer Acyclovir. She will call back.

## 2018-01-09 NOTE — Telephone Encounter (Signed)
Also note Dr. Marjory Lies stated he does not want to order the Methylprednisolone at this time.

## 2018-01-09 NOTE — Telephone Encounter (Signed)
Per Dr. Marjory Lies, will first collect CBC with diff and CMP either today or tomorrow and then proceed with start of Acyclovir therapy on Monday 01/12/2018 after results obtained. RN called pt and she is unable to come to the office today. Will fax orders to [facility]. Spoke with pt's daughter Totiana Vasco (on [place]) and discussed the results of the scans that show enhancement and show damage to the nerve from the shingles. Discussed that Dr. Marjory Lies wants to treat with IV Acyclovir every 8 hours for 14 days at pt's home. However discussed with daughter that if patient's arm is actively getting worse she will need to be taken to the hospital (per Dr. Marjory Lies). Pam verbalized understanding and her questions were answered. She will take pt to get her labs done at the [facility] on [address] in [place] either this afternoon or tomorrow between 8-12 pm [facility] office hours. She verbalized appreciation.   Order written for home health and reviewed with Dr. Marjory Lies. Orders also written for labs, requisitions ready for signature, then will fax to [facility]. Spoke with [facility], their fax number for the [facility] location is [phone].

## 2018-01-09 NOTE — Telephone Encounter (Signed)
Spoke with Pam and discussed the plan. She is aware of the plan for PICC line placement, ins pending, and plan to start on Monday pending lab results. She has more detailed questions about the condition, when it started, likelihood of arm movement improvement. RN advised that she would pass message along to physician to see if they can call her and discuss.

## 2018-01-12 ENCOUNTER — Encounter: Payer: PPO | Admitting: Psychology

## 2018-01-12 ENCOUNTER — Encounter: Payer: Self-pay | Admitting: Adult Health

## 2018-01-12 DIAGNOSIS — B0112 Varicella myelitis: Secondary | ICD-10-CM | POA: Diagnosis not present

## 2018-01-12 NOTE — Telephone Encounter (Signed)
Called the patient's daughter back. Pam states the patient was unable to get the labs drawn because the [facility] didn't have extended hrs even though their voicemail stated they would be open. Patient did go first thing this morning and had the lab work drawn. [facility] on [address]. Pam has made Debbie with [facility] aware of this as well. Advised the patient's daughter I will make Dr Marjory Lies aware of this and see how he wants to proceed on starting the iv medication for the patient. I will call the patient back with when he recommends starting the medication and at that time will also have her get in contact with Debbie at [facility] to make aware of this.

## 2018-01-12 NOTE — Telephone Encounter (Signed)
Pts daughter Elita Quick requesting a call from RN stating they were unable to have pts labs done Friday 01/09/18. Did not wish to discuss further with me, please advise

## 2018-01-12 NOTE — Telephone Encounter (Signed)
[name] to discuss medication Acyclovir, can be reached at [phone]

## 2018-01-13 ENCOUNTER — Encounter: Payer: Self-pay | Admitting: *Deleted

## 2018-01-13 ENCOUNTER — Other Ambulatory Visit (HOSPITAL_COMMUNITY): Payer: Self-pay | Admitting: Neurology

## 2018-01-13 ENCOUNTER — Telehealth (HOSPITAL_COMMUNITY): Payer: Self-pay | Admitting: Radiology

## 2018-01-13 DIAGNOSIS — Z792 Long term (current) use of antibiotics: Secondary | ICD-10-CM

## 2018-01-13 DIAGNOSIS — M25512 Pain in left shoulder: Secondary | ICD-10-CM | POA: Diagnosis not present

## 2018-01-13 NOTE — Telephone Encounter (Signed)
Results received from [facility] for CMP and CBC. Copy sent to medical records.

## 2018-01-13 NOTE — Telephone Encounter (Signed)
Called Melissa @ [facility] pharmacy and informed her that pt is getting her PICC placed tomorrow at 2:00 pm. Informed her that pt's daughter is aware that they may call her about possibly initiating therapy later tomorrow. Efraim Kaufmann will give this message to Debbie in pharmacy and she is aware that RN will fax over the orders now.   Orders faxed to [facility] pharmacy. Received a receipt of confirmation.

## 2018-01-13 NOTE — Telephone Encounter (Addendum)
[facility] They need written order for the Acyclovir with MD signature including order for labs per protocol. Also, she stated that a PICC has to be placed first. Pt's insurance requires pt medication be administered via specific pump and a picc will be needed. Will update [facility] when we have the pt's appt time for PICC placement.    Spoke with Dr. Lucia GaskinsAhern who is aware that pt will need PICC placed first. Order for Acyclovir 10 mg/kg IV every 8 hours x 14 days and labs per protocol written (located in letter section in chart) and signed.   Spoke with Mor[facility]ley at [facility] and collected available appt times for tomorrow PICC placement (9:30, 12 pm, 2 pm). She confirmed that the current order is correct. They are aware that picc will be needed about 2 weeks and pt's L arm is painful and has restricted movement. Called pt's daughter Elita [place]am (on [place]) and spoke with her. Advised her that pt's labs were fine per Dr. Lucia GaskinsAhern and that she will go forward with Acyclovir and PICC will need to be placed first. She spoke with pt and they took the 2:00 pm appt tomorrow 01/14/2018. She is aware that [facility]. I called Mor[facility] and let her know. Spoke with Pam again per her request to confirm appt and answered her questions. [facility] is aware [facility] and then they will be in touch with her regarding starting the therapy. They will also compllete labs per protocol while pt on therapy. She verbalized appreciation.

## 2018-01-13 NOTE — Telephone Encounter (Signed)
Called pt to schedule PICC placement, left VM for her to call back. JM

## 2018-01-13 NOTE — Telephone Encounter (Signed)
I called [facility] and spoke with Katrice. I requested that the results are faxed [phone]. Katrice stated that she would send them over right now.

## 2018-01-13 NOTE — Telephone Encounter (Addendum)
Dr. Lucia Gaskins reviewed the lab results and gave the verbal order to start the IV Acyclovir that Dr. Marjory Lies ordered. Called [facility] pharmacy and spoke with Corrie Dandy (nurse) covering for [place] while she is at lunch. RN informed Corrie Dandy that Dr. Lucia Gaskins reviewed the lab results and has given the verbal order to start the IV Acyclovir. She verbalized understanding and confirmed that they have projected dose of Acyclovir 630 mg IV every 8 hours x 14 days. RN faxed the results to [facility]. Received a receipt of confirmation.

## 2018-01-14 ENCOUNTER — Ambulatory Visit (HOSPITAL_COMMUNITY)
Admission: RE | Admit: 2018-01-14 | Discharge: 2018-01-14 | Disposition: A | Discharge status: Home / Self Care | Payer: PPO | Source: Ambulatory Visit | Attending: Neurology | Admitting: Neurology

## 2018-01-14 DIAGNOSIS — G373 Acute transverse myelitis in demyelinating disease of central nervous system: Secondary | ICD-10-CM | POA: Insufficient documentation

## 2018-01-14 DIAGNOSIS — Z452 Encounter for adjustment and management of vascular access device: Secondary | ICD-10-CM | POA: Diagnosis not present

## 2018-01-14 DIAGNOSIS — B029 Zoster without complications: Secondary | ICD-10-CM | POA: Insufficient documentation

## 2018-01-14 DIAGNOSIS — Z792 Long term (current) use of antibiotics: Secondary | ICD-10-CM | POA: Diagnosis not present

## 2018-01-14 MED ORDER — LIDOCAINE HCL (PF) 1 % IJ SOLN
INTRAMUSCULAR | Status: AC
  Filled 2018-01-14: qty 30

## 2018-01-14 MED ORDER — HEPARIN SOD (PORK) LOCK FLUSH 100 UNIT/ML IV SOLN
INTRAVENOUS | Status: AC
  Filled 2018-01-14: qty 5

## 2018-01-14 MED ORDER — LIDOCAINE HCL 1 % IJ SOLN
INTRAMUSCULAR | Status: AC | PRN
  Administered 2018-01-14: 5 mL

## 2018-01-15 DIAGNOSIS — I10 Essential (primary) hypertension: Secondary | ICD-10-CM | POA: Diagnosis not present

## 2018-01-15 DIAGNOSIS — R262 Difficulty in walking, not elsewhere classified: Secondary | ICD-10-CM | POA: Diagnosis not present

## 2018-01-15 DIAGNOSIS — R413 Other amnesia: Secondary | ICD-10-CM | POA: Diagnosis not present

## 2018-01-15 DIAGNOSIS — Z86718 Personal history of other venous thrombosis and embolism: Secondary | ICD-10-CM | POA: Diagnosis not present

## 2018-01-15 DIAGNOSIS — Z5181 Encounter for therapeutic drug level monitoring: Secondary | ICD-10-CM | POA: Diagnosis not present

## 2018-01-15 DIAGNOSIS — Z79899 Other long term (current) drug therapy: Secondary | ICD-10-CM | POA: Diagnosis not present

## 2018-01-15 DIAGNOSIS — G541 Lumbosacral plexus disorders: Secondary | ICD-10-CM | POA: Diagnosis not present

## 2018-01-15 DIAGNOSIS — M25561 Pain in right knee: Secondary | ICD-10-CM | POA: Diagnosis not present

## 2018-01-15 DIAGNOSIS — G540 Brachial plexus disorders: Secondary | ICD-10-CM | POA: Diagnosis not present

## 2018-01-15 DIAGNOSIS — Z452 Encounter for adjustment and management of vascular access device: Secondary | ICD-10-CM | POA: Diagnosis not present

## 2018-01-15 DIAGNOSIS — K219 Gastro-esophageal reflux disease without esophagitis: Secondary | ICD-10-CM | POA: Diagnosis not present

## 2018-01-15 DIAGNOSIS — E785 Hyperlipidemia, unspecified: Secondary | ICD-10-CM | POA: Diagnosis not present

## 2018-01-15 DIAGNOSIS — Z96651 Presence of right artificial knee joint: Secondary | ICD-10-CM | POA: Diagnosis not present

## 2018-01-15 DIAGNOSIS — Z471 Aftercare following joint replacement surgery: Secondary | ICD-10-CM | POA: Diagnosis not present

## 2018-01-15 DIAGNOSIS — G8324 Monoplegia of upper limb affecting left nondominant side: Secondary | ICD-10-CM | POA: Diagnosis not present

## 2018-01-15 DIAGNOSIS — B0224 Postherpetic myelitis: Secondary | ICD-10-CM | POA: Diagnosis not present

## 2018-01-15 DIAGNOSIS — Z96653 Presence of artificial knee joint, bilateral: Secondary | ICD-10-CM | POA: Diagnosis not present

## 2018-01-16 ENCOUNTER — Telehealth: Payer: Self-pay | Admitting: Neurology

## 2018-01-16 NOTE — Telephone Encounter (Signed)
[facility] called to inform us that pt has a pick line in and that her Acyclovir IV has been started for a 10 day course 3 times a day.

## 2018-01-16 NOTE — Telephone Encounter (Signed)
noted 

## 2018-01-16 NOTE — Telephone Encounter (Signed)
This patient has had a PICC line placed for IV Valtrex for a shingles infection associated with a transverse myelitis.  The course of therapy will take over 3 weeks, for this reason, a PICC line was indicated.

## 2018-01-20 DIAGNOSIS — M25512 Pain in left shoulder: Secondary | ICD-10-CM | POA: Diagnosis not present

## 2018-01-22 ENCOUNTER — Encounter: Payer: Self-pay | Admitting: Adult Health

## 2018-01-22 DIAGNOSIS — Z96651 Presence of right artificial knee joint: Secondary | ICD-10-CM | POA: Diagnosis not present

## 2018-01-22 DIAGNOSIS — R262 Difficulty in walking, not elsewhere classified: Secondary | ICD-10-CM | POA: Diagnosis not present

## 2018-01-22 DIAGNOSIS — M25561 Pain in right knee: Secondary | ICD-10-CM | POA: Diagnosis not present

## 2018-01-22 DIAGNOSIS — Z471 Aftercare following joint replacement surgery: Secondary | ICD-10-CM | POA: Diagnosis not present

## 2018-01-22 DIAGNOSIS — B0224 Postherpetic myelitis: Secondary | ICD-10-CM | POA: Diagnosis not present

## 2018-01-23 DIAGNOSIS — M25512 Pain in left shoulder: Secondary | ICD-10-CM | POA: Diagnosis not present

## 2018-01-27 DIAGNOSIS — M25512 Pain in left shoulder: Secondary | ICD-10-CM | POA: Diagnosis not present

## 2018-01-28 ENCOUNTER — Telehealth: Payer: Self-pay | Admitting: Neurology

## 2018-01-28 NOTE — Telephone Encounter (Signed)
I called the daughter back to see if the patient has improved (L arm weakness, pain) since her abx treatment. I left office number in message and asked for call back (no details left).

## 2018-01-28 NOTE — Telephone Encounter (Signed)
Patient's daughter Elita Quick calling to know what is next step after last treatment for inflamed nerve due to shingles. Also looking for lab results. Please call

## 2018-01-29 ENCOUNTER — Telehealth: Payer: Self-pay

## 2018-01-29 DIAGNOSIS — B0224 Postherpetic myelitis: Secondary | ICD-10-CM | POA: Diagnosis not present

## 2018-01-29 DIAGNOSIS — M25512 Pain in left shoulder: Secondary | ICD-10-CM | POA: Diagnosis not present

## 2018-01-29 NOTE — Telephone Encounter (Addendum)
Ok to pull PICC per v.o. Dr. Lucia Gaskins.  I called Marcelino Duster RN @ [facility] [phone]) and gave her the verbal order from Dr. Lucia Gaskins to pull the pt's PICC line. She verbalized understanding and appreciation for the call.

## 2018-01-29 NOTE — Telephone Encounter (Signed)
[name] [name] from [facility] calling needing a verbal order to pull the picc line. Please advise.

## 2018-01-29 NOTE — Telephone Encounter (Signed)
Noted will call back and document in other phone note.

## 2018-01-29 NOTE — Telephone Encounter (Signed)
Daughter was returning your call from yesterday.

## 2018-01-29 NOTE — Telephone Encounter (Signed)
RN Marcelino Duster from [facility] has called to inform that pt has finished Acyclovir treatment she drew CMP & CBC .  RN needs order to pull her picc line, please call her @ [phone]

## 2018-01-29 NOTE — Telephone Encounter (Signed)
Dr. Lucia GaskinsAhern updated. Will advise pt's daughter Elita Quickam that pt should continue therapy and see how she improves. I called Pam and advised her of this. I advised that if she has any inclination that pt is worse or is not improving with continued physical therapy to call our office. She stated pt has been attending therapy twice weekly and had scheduled through today. She was advised that if PT is unable to schedule further to let us know. She verbalized understanding and appreciation for the call. Her questions were answered.

## 2018-01-29 NOTE — Telephone Encounter (Addendum)
I spoke with pt's daughter Elita Quick @ [phone]. She stated that the patient has improved. She can move her L arm about twice as much as she could pre-treatment. She still has some pain & tingling. She stated that if pt's arm is hanging by her side she can raise it parallel to the ground but is unable to raise her elbow yet. She is still undergoing physical therapy. She stated pt's last treatment was yesterday around 3:00. Pam is aware that we are calling [facility] to give them the order to d/c the PICC. She is also aware that the most recent labs we received from [facility] were fine  (per Dr. Lucia Gaskins).  I informed her that we would call back with next steps per Dr. Lucia Gaskins. She verbalized appreciation.

## 2018-02-02 DIAGNOSIS — M25512 Pain in left shoulder: Secondary | ICD-10-CM | POA: Diagnosis not present

## 2018-02-04 DIAGNOSIS — M25512 Pain in left shoulder: Secondary | ICD-10-CM | POA: Diagnosis not present

## 2018-02-10 ENCOUNTER — Encounter: Payer: PPO | Admitting: Psychology

## 2018-02-10 DIAGNOSIS — M25512 Pain in left shoulder: Secondary | ICD-10-CM | POA: Diagnosis not present

## 2018-02-13 DIAGNOSIS — M25512 Pain in left shoulder: Secondary | ICD-10-CM | POA: Diagnosis not present

## 2018-02-17 DIAGNOSIS — M25512 Pain in left shoulder: Secondary | ICD-10-CM | POA: Diagnosis not present

## 2018-02-19 DIAGNOSIS — M25512 Pain in left shoulder: Secondary | ICD-10-CM | POA: Diagnosis not present

## 2018-02-23 ENCOUNTER — Encounter: Payer: PPO | Attending: Psychology | Admitting: Psychology

## 2018-02-23 DIAGNOSIS — R413 Other amnesia: Secondary | ICD-10-CM | POA: Diagnosis not present

## 2018-02-24 DIAGNOSIS — M25512 Pain in left shoulder: Secondary | ICD-10-CM | POA: Diagnosis not present

## 2018-02-26 DIAGNOSIS — M25512 Pain in left shoulder: Secondary | ICD-10-CM | POA: Diagnosis not present

## 2018-03-03 DIAGNOSIS — M25512 Pain in left shoulder: Secondary | ICD-10-CM | POA: Diagnosis not present

## 2018-03-05 DIAGNOSIS — M25512 Pain in left shoulder: Secondary | ICD-10-CM | POA: Diagnosis not present

## 2018-03-20 ENCOUNTER — Other Ambulatory Visit: Payer: Self-pay

## 2018-03-20 ENCOUNTER — Encounter: Payer: PPO | Attending: Psychology | Admitting: Psychology

## 2018-03-20 ENCOUNTER — Encounter: Payer: Self-pay | Admitting: Psychology

## 2018-03-20 DIAGNOSIS — K219 Gastro-esophageal reflux disease without esophagitis: Secondary | ICD-10-CM | POA: Insufficient documentation

## 2018-03-20 DIAGNOSIS — Z79899 Other long term (current) drug therapy: Secondary | ICD-10-CM | POA: Insufficient documentation

## 2018-03-20 DIAGNOSIS — Z79890 Hormone replacement therapy (postmenopausal): Secondary | ICD-10-CM | POA: Diagnosis not present

## 2018-03-20 DIAGNOSIS — R413 Other amnesia: Secondary | ICD-10-CM

## 2018-03-20 DIAGNOSIS — I341 Nonrheumatic mitral (valve) prolapse: Secondary | ICD-10-CM | POA: Diagnosis not present

## 2018-03-20 DIAGNOSIS — E039 Hypothyroidism, unspecified: Secondary | ICD-10-CM | POA: Insufficient documentation

## 2018-03-20 NOTE — Progress Notes (Signed)
Mental Status Examination & Behavior Observations: The patient arrived on time to her 15:00 testing appointment and was accompanied by her daughter. Patient was administered the Repeatable Battery for the Assessment of Neuropsychological Status (RBANS), Update, Form B. The evaluation lasted 60 minutes. She was alert and fully oriented. She was appropriately dressed and appeared to have good hygiene. She ambulated with some mild difficulty but did not require assistance. Her spontaneous speech was clear and prosodic with normal rate, tone, and volume. No evidence of word finding difficulties were noted. Mood was bright and euthymic with appropriate and congruent affect. Thought processes were organized, logical, and goal directed. Insight and Judgement appeared grossly intact. She was cooperative with all assigned tasks and appeared to give good effort. She did not have any difficulty hearing or understanding test instructions and did not require additional prompting. Of note, performance on semantic fluency subtest of the RBANS likely represents an underestimate of her true ability. Her score significantly increased on another semantic fluency test (see Verbal Fluency: Fruits and Vegetables score).  Results of the RBANS-Update (Form B):  Measure: Standard or Scaled Score Percentile Description  Immediate Memory 87 19 Low Average  List Learning 8 25 Average  Story Memory 8 25 Average  Visuospatial/Constructional 116 86 High Average  Figure Copy 10 50 Average  Line Orientation - >75 High Average  Language 92 30 Average  Picture Naming - >75 High Average  Semantic Fluency 6 9 Low Average  Attention 100 50 Average  Digit Span 12 75 Average  Coding 8 25 Average  Delayed Memory 107 68 Average  List Recall - 26-50 Average  List Recognition - 51-75 Average  Story Recall 10 50 Average  Figure Recall 12 75 Average  Total Score 100 50 Average   Results of Verbal Fluency Test:   Semantic Fluency  (Fruits & Vegetables)= 17, WNL

## 2018-03-22 ENCOUNTER — Encounter: Payer: Self-pay | Admitting: Psychology

## 2018-03-22 NOTE — Progress Notes (Signed)
Neuropsychological Consultation   Patient:   [name]   DOB:   06/11/1933  MR Number:  [id number]  [facility] [address] [id number] [facility] [place] [id number] Dept: [phone]           Date of Service:   02/23/2018  Start Time:   1 PM End Time:   2 PM  Provider/Observer:  Arley PhenixJohn Yazeed Pryer, Psy.D.       Clinical Neuropsychologist       Billing Code/Service: Neurobehavioral status exam  Chief Complaint:    [name]. [name] is an 85 year old female who was referred by Dr. Lucia GaskinsAhern for neuropsychological evaluation due to increasing issues with memory difficulties.  The patient reports that she does not notice any changes other than residual pain and difficulties with her arm after she developed shingles in October 2019.  The patient's daughter reports that she and her sister noticed the patient having some difficulties with memory after knee replacement surgery in 2016.  They report that the patient appeared to get better but then in Christmas of 2018 they started seeing more short-term memory issues but felt that the difficulties have been generally stable.  There is been one episode of geographic disorientation but the family reports that the patient has never been very good with directions.  The patient is also having significant sleep difficulties.  Reason for Service:  [name]. [name] is an 85 year old female who was referred by Dr. Lucia GaskinsAhern for neuropsychological evaluation due to increasing issues with memory difficulties.  The patient reports that she does not notice any changes other than residual pain and difficulties with her arm after she developed shingles in October 2019.  The patient's daughter reports that she and her sister noticed the patient having some difficulties with memory after knee replacement surgery in 2016.  They report that the  patient appeared to get better but then in Christmas of 2018 they started seeing more short-term memory issues but felt that the difficulties have been generally stable.  There is been one episode of geographic disorientation but the family reports that the patient has never been very good with directions.  The patient is also having significant sleep difficulties.  The patient is described as having a past medical history of hypothyroidism, hyperlipidemia, and memory changes.  There is also a family history of dementia with the patient's sister being diagnosed in the past year.  There is no other family history of dementia noted.  The patient is having increasing memory difficulties and is having to write things down to aid her memory.  The patient does continue to live alone and maintain her house and dated the living activities.  She continues to manage her medications, finances, and cooks.  Recent MRI of the brain at the reported impressions related to mild generalized cortical atrophy consistent with age as well as mild chronic microvascular ischemic changes.  There were no acute findings noted.  Current Status:  The patient describes issues with pain in her right arm after developing shingles in 2016.  She acknowledges having history with hypothyroidism and a family history of her sister being diagnosed with dementia over the past year.  No other family members are noted.  While the patient denies any significant changes her daughters have noticed increasing short-term memory issues that initially presented around January 2016 after knee replacement surgery but improved.  They then again noticed changes  starting in 2018 but feel that the memory difficulties have been fairly stable over the past year.  The patient had been sleeping well until she developed shingles.  Reliability of Information: Information is derived from a 1 hour face-to-face clinical interview with the patient is  Behavioral  Observation: [name]  presents as a 85 y.o.-year-old Right Caucasian Female who appeared her stated age. her dress was Appropriate and she was Well Groomed and her manners were Appropriate to the situation.  her participation was indicative of Appropriate and Attentive behaviors.  There were not any physical disabilities noted.  she displayed an appropriate level of cooperation and motivation.     Interactions:    Active Appropriate and Attentive  Attention:   within normal limits and attention span and concentration were age appropriate  Memory:   within normal limits; recent and remote memory intact  Visuo-spatial:  not examined  Speech (Volume):  normal  Speech:   normal;   Thought Process:  Coherent and Relevant  Though Content:  WNL; not suicidal and not homicidal  Orientation:   person, place, time/date and situation  Judgment:   Good  Planning:   Good  Affect:    Appropriate  Mood:    Euthymic  Insight:   Good  Intelligence:   normal  Marital Status/Living: The patient was born and raised in [place] and had 5 siblings.  She currently lives alone and is lives alone for the past 23 years.  The patient is widowed.  The patient has 2 daughters both of whom are in their 31s without significant medical issues except for the youngest daughter with sleep apnea.  Current Employment: The patient is retired.  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  She was honorably discharged and discharged with a rank of a/2C.  The patient's hobbies and interests include flower gardening, playing bridge and doing yard work.  Substance Use:  No concerns of substance abuse are reported.    Education:   HS Graduate  Medical History:   Past Medical History:  Diagnosis Date  . Acute blood loss anemia   . Arthritis   . Benign  essential HTN    pt denies   . Constipation   . GERD (gastroesophageal reflux disease)    infrequently - otc med as needed  . 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 Hyponatremia   . Hypothyroidism   . Leukocytosis   . MVP (mitral valve prolapse)   . Primary osteoarthritis of right knee   . Rosacea   . Shingles 10/2017  . Unsteady gait             Abuse/Trauma History: The patient denies any significant history of abuse or trauma.  Psychiatric History:  The patient denies any prior psychiatric history and there are no indications of depression or anxiety noted in her past medical history.  Family Med/Psych History:  Family History  Problem Relation Age of Onset  . Stroke Mother        ?  Marland Kitchen Congestive Heart Failure Father   . Rheum arthritis Sister   . Lung cancer Brother   . Prostate cancer Brother   . Aneurysm Brother        ?  Marland Kitchen  Lung cancer Brother   . Other Brother        open heart surgery  . Breast cancer Sister   . Dementia Sister   . Fibromyalgia Sister     Risk of Suicide/Violence: virtually non-existent the patient denies any suicidal or homicidal ideation.  Impression/DX:  Jasmine Climes. Oherron is an 85 year old female who was referred by Dr. Lucia Gaskins for neuropsychological evaluation due to increasing issues with memory difficulties.  The patient reports that she does not notice any changes other than residual pain and difficulties with her arm after she developed shingles in October 2019.  The patient's daughter reports that she and her sister noticed the patient having some difficulties with memory after knee replacement surgery in 2016.  They report that the patient appeared to get better but then in Christmas of 2018 they started seeing more short-term memory issues but felt that the difficulties have been generally stable.  There is been one episode of geographic disorientation but the family  reports that the patient has never been very good with directions.  The patient is also having significant sleep difficulties.  The patient is described as having a past medical history of hypothyroidism, hyperlipidemia, and memory changes.  There is also a family history of dementia with the patient's sister being diagnosed in the past year.  There is no other family history of dementia noted.  The patient is having increasing memory difficulties and is having to write things down to aid her memory.  The patient does continue to live alone and maintain her house and dated the living activities.  She continues to manage her medications, finances, and cooks.  Recent MRI of the brain at the reported impressions related to mild generalized cortical atrophy consistent with age as well as mild chronic microvascular ischemic changes.  There were no acute findings noted.   Disposition/Plan:  We have set the patient up for formal neuropsychological evaluation utilizing the RBANS repeatable neuropsychological test battery once that is completed a determination will be made as to the need for any further neuropsychological testing.  Diagnosis:    Memory loss         Electronically Signed   _______________________ Arley Phenix, Psy.D.

## 2018-05-07 ENCOUNTER — Encounter: Payer: PPO | Attending: Psychology | Admitting: Psychology

## 2018-05-07 ENCOUNTER — Other Ambulatory Visit: Payer: Self-pay

## 2018-05-07 ENCOUNTER — Encounter: Payer: Self-pay | Admitting: Psychology

## 2018-05-07 DIAGNOSIS — K219 Gastro-esophageal reflux disease without esophagitis: Secondary | ICD-10-CM | POA: Insufficient documentation

## 2018-05-07 DIAGNOSIS — Z818 Family history of other mental and behavioral disorders: Secondary | ICD-10-CM | POA: Diagnosis not present

## 2018-05-07 DIAGNOSIS — I341 Nonrheumatic mitral (valve) prolapse: Secondary | ICD-10-CM | POA: Insufficient documentation

## 2018-05-07 DIAGNOSIS — Z96659 Presence of unspecified artificial knee joint: Secondary | ICD-10-CM | POA: Insufficient documentation

## 2018-05-07 DIAGNOSIS — R4181 Age-related cognitive decline: Secondary | ICD-10-CM | POA: Diagnosis not present

## 2018-05-07 DIAGNOSIS — Z79899 Other long term (current) drug therapy: Secondary | ICD-10-CM | POA: Diagnosis not present

## 2018-05-07 DIAGNOSIS — Z79890 Hormone replacement therapy (postmenopausal): Secondary | ICD-10-CM | POA: Insufficient documentation

## 2018-05-07 DIAGNOSIS — E785 Hyperlipidemia, unspecified: Secondary | ICD-10-CM | POA: Insufficient documentation

## 2018-05-07 DIAGNOSIS — R413 Other amnesia: Secondary | ICD-10-CM | POA: Diagnosis not present

## 2018-05-07 DIAGNOSIS — E039 Hypothyroidism, unspecified: Secondary | ICD-10-CM | POA: Diagnosis not present

## 2018-05-07 NOTE — Progress Notes (Signed)
Neuropsychological evaluation   Patient:  [name]   DOB: [birth year]  MR Number: [id number]  Location: [facility] [place] [facility] [address] [place], [address] [id number] [facility] [place] [address] Dept: [phone]  Start: 2 PM End: 3 PM  This visit was for interpretation, analysis and report writing.  The duration was 1 hour.  This was not an in person visit.  Provider/Observer:     Hershal Coria PsyD  Chief Complaint:      Chief Complaint  Patient presents with  . Memory Loss    Reason For Service:     [name]. [name] is an 85 year old female who was referred by Dr. Lucia Gaskins for neuropsychological evaluation due to increasing issues with memory difficulties.  The patient reports that she does not notice any changes other than residual pain and difficulties with her arm after she developed shingles in October 2019.  The patient's daughter reports that she and her sister noticed the patient having some difficulties with memory after knee replacement surgery in 2016.  They report that the patient appeared to get better but then in Christmas of 2018 they started seeing more short-term memory issues but felt that the difficulties have been generally stable.  There is been one episode of geographic disorientation but the family reports that the patient has never been very good with directions.  The patient is also having significant sleep difficulties.  The patient is described as having a past medical history of hypothyroidism, hyperlipidemia, and memory changes.  There is also a family history of dementia with the patient's sister being diagnosed in the past year.  There is no other family history of dementia noted.  The patient is having increasing memory difficulties and is having to write things down to aid her memory.  The patient does continue to live alone and maintain her house and daily  living activities.  She continues to manage her medications, finances, and cooks.  Recent MRI of the brain at the reported impressions related to mild generalized cortical atrophy consistent with age as well as mild chronic microvascular ischemic changes.  There were no acute findings noted.  Testing Administered:  The patient was administered the RBANS B neuropsychological test battery that assesses a broad range of cognitive functioning and memory functions.  Participation Level:   Active  Participation Quality:  Appropriate      Behavioral Observation:  Well Groomed, Alert, and Appropriate.  The test battery was administered by Greig Right, Psy.D. below are the behavioral observations he produced during the administration of this neuropsychological test battery.  Mental Status Examination & Behavior Observations: The patient arrived on time to her 15:00 testing appointment and was accompanied by her daughter. Patient was administered the Repeatable Battery for the Assessment of Neuropsychological Status (RBANS), Update, Form B. The evaluation lasted 60 minutes. She was alert and fully oriented. She was appropriately dressed and appeared to have good hygiene. She ambulated with some mild difficulty but did not require assistance. Her spontaneous speech was clear and prosodic with normal rate, tone, and volume. No evidence of word finding difficulties were noted. Mood was bright and euthymic with appropriate and congruent affect. Thought processes were organized, logical, and goal directed. Insight and Judgement appeared grossly intact. She was cooperative with all assigned tasks and appeared to give good effort. She did not have any difficulty hearing or understanding test instructions and did not require additional prompting. Of note,  performance on semantic fluency subtest of the RBANS likely represents an underestimate of her true ability. Her score significantly increased on another semantic  fluency test (see Verbal Fluency: Fruits and Vegetables score).     Test Results:   The patient clearly appeared to try her hardest throughout this test measure and the results of the current evaluation do appear to be a fair and accurate estimation of the patient's current cognitive and memory functioning.     RBANS Update Form B Total Scale 100  The patient produced a total scale index score of 100 which falls at the 50th percentile and suggest that globally the patient is doing quite well when compared against her age-matched comparison group.  This composite score is a good indicator of general cognitive functioning and suggest someone who is likely doing fairly well from a language, memory, and executive functioning standpoint.     RBANS Update Form B Immediate Memory 87 List Learning8 [facility]  The patient produced an immediate memory index score of 87 which falls at the 19th percentile and in the low average range of functioning.  Individual subtest making up this measure were consistent with each other and fell in the average range of functioning including learning individual words and a list over successive trials as well as learning a more complex relational verbal memory related to story memory.  Both of her subtest were at the lower end of the average range  This pattern suggest that the patient should be able to easily manage her medications, finances and other important things such as cooking and remembering appointments.       RBANS Update Form B Visuospatial/ Constructional 116 Figure Copy10 Line Orientation>75  The patient produced a visual spatial/constructional index score of 116 which falls in the high average range of functioning and at the 86 percentile relative to her age-matched comparison group.  This is an excellent score and suggest good visual spatial and visual processing abilities and there were no indications of visual inattention or neglect.        RBANS  Update Form B Attention 100 Coding8 Digit Span12  The patient produced an attention index score of 100 which falls at the 50th percentile and is in the average range.  Individual items making up this show that her auditory encoding abilities fall in the average range and suggest the ability to maintain basic auditory encoding abilities and maintain attention and concentration.  The patient produced a digit span scaled score of 12 which is in the 75th percentile and suggest very good abilities with regard to information processing speed and focus execute abilities.         RBANS Update Form B Language 92 Semantic Fluency6 Picture Naming>75  The patient produced a language index score of 92 which falls in the average range and at the 30th percentile.  This measure assesses expressive language functioning and fluent language functioning.  The patient did very well with regard to picture naming and targeted naming abilities where she performed at the 75th percentile.  This is in the high average range.  The patient had some difficulty and her lowest performance on the somatic fluency scale.  However, during administration of this measure Dr. Vella KohlerZusman noted some particular difficulties initially with understanding the task and therefore he administered the vegetable and fruit naming measure off of another task.  On this other measure she performed in the average range and therefore there do not appear to be any indications of  verbal fluency deficits even though the 1 measure on this test battery was in the mildly impaired range her verbal fluency appears to be fine.     DRBANS Update Form B Delayed Memory 107 List Recall26-50 Story Recall10 Figure Recall12 List Recognition51-75  The patient produced a delayed memory index score of 107 which falls at the 68th percentile and is in the average range.  Individual subtest making up this measure shows that her list recall performance fell between the 26th and  the 50th percentile both which are within the average range, her story recall, figure recall and list recognition were all in the average to high average range of functioning.  The patient did very well with regard to list recognition from previously learned information.  There were clearly no observed objective findings of delayed memory deficits.  Because of observed issues with the somatic fluency test on the RBANS battery the administrator added a another verbal fluency test to check the validity of the initial results.  On the second verbal fluency test which was a somatic fluency measure of fruits and vegetables the patient was able to freely generate 17 fruits and vegetables within 1/62 period of time.  This performance is within normal limits and there were no indications of consistent verbal fluency issues.   Results of Verbal Fluency Test:   Semantic Fluency (Fruits & Vegetables)= 17, WNL     Summary of Results:   Overall, the results of the current objective neuropsychological evaluation suggest that Verner Tewari is doing quite well from a cognitive standpoint.  This is consistent with the patient's reports as well as generally being consistent with her family's reports.  They do discuss some acute issues where some difficulties were identified particularly after her knee replacement surgery in 2016 and the development of shingles in 2019.  However, there do not appear to be any consistent or persistent cognitive deficits that could not be easily explained by normal age-related memory changes.  Recommendations:   As far as recommendations, I do think the patient is doing quite well and I would highly recommend her to continue to do basic types of health maintenance issues including a good regular diet of vegetables and good nutrition along with regular physical activity such as walking and maintaining a very good sleep hygiene pattern.  Other than that, I think the patient is doing very  well and there are no patterns consistent with progressive or even acquired dementia types of processes.  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 Neuropsychologist

## 2018-05-19 ENCOUNTER — Ambulatory Visit: Payer: PPO | Admitting: Psychology

## 2018-05-28 ENCOUNTER — Other Ambulatory Visit: Payer: Self-pay

## 2018-05-28 ENCOUNTER — Encounter: Payer: PPO | Attending: Psychology | Admitting: Psychology

## 2018-05-28 DIAGNOSIS — Z818 Family history of other mental and behavioral disorders: Secondary | ICD-10-CM | POA: Diagnosis not present

## 2018-05-28 DIAGNOSIS — R4181 Age-related cognitive decline: Secondary | ICD-10-CM | POA: Insufficient documentation

## 2018-05-28 DIAGNOSIS — I341 Nonrheumatic mitral (valve) prolapse: Secondary | ICD-10-CM | POA: Diagnosis not present

## 2018-05-28 DIAGNOSIS — Z79890 Hormone replacement therapy (postmenopausal): Secondary | ICD-10-CM | POA: Diagnosis not present

## 2018-05-28 DIAGNOSIS — Z96659 Presence of unspecified artificial knee joint: Secondary | ICD-10-CM | POA: Insufficient documentation

## 2018-05-28 DIAGNOSIS — K219 Gastro-esophageal reflux disease without esophagitis: Secondary | ICD-10-CM | POA: Diagnosis not present

## 2018-05-28 DIAGNOSIS — R413 Other amnesia: Secondary | ICD-10-CM

## 2018-05-28 DIAGNOSIS — E785 Hyperlipidemia, unspecified: Secondary | ICD-10-CM | POA: Diagnosis not present

## 2018-05-28 DIAGNOSIS — Z79899 Other long term (current) drug therapy: Secondary | ICD-10-CM | POA: Insufficient documentation

## 2018-05-28 DIAGNOSIS — E039 Hypothyroidism, unspecified: Secondary | ICD-10-CM | POA: Diagnosis not present

## 2018-06-03 DIAGNOSIS — E782 Mixed hyperlipidemia: Secondary | ICD-10-CM | POA: Diagnosis not present

## 2018-06-03 DIAGNOSIS — E039 Hypothyroidism, unspecified: Secondary | ICD-10-CM | POA: Diagnosis not present

## 2018-06-03 DIAGNOSIS — Z23 Encounter for immunization: Secondary | ICD-10-CM | POA: Diagnosis not present

## 2018-06-03 DIAGNOSIS — Z0000 Encounter for general adult medical examination without abnormal findings: Secondary | ICD-10-CM | POA: Diagnosis not present

## 2018-06-03 DIAGNOSIS — R7301 Impaired fasting glucose: Secondary | ICD-10-CM | POA: Diagnosis not present

## 2018-06-04 ENCOUNTER — Encounter: Payer: Self-pay | Admitting: Psychology

## 2018-06-04 NOTE — Progress Notes (Signed)
Today I provided feedback to the patient and her daughter regarding results of the recent neuropsychological evaluation.  This was an in person visit at my outpatient clinic office.  Below you will find the results of the neuropsychological evaluation.  The full report can be found on her visit dated 05/07/2018.  Summary of Results:                        Overall, the results of the current objective neuropsychological evaluation suggest that [name] is doing quite well from a cognitive standpoint.  This is consistent with the patient's reports as well as generally being consistent with her family's reports.  They do discuss some acute issues where some difficulties were identified particularly after her knee replacement surgery in 2016 and the development of shingles in 2019.  However, there do not appear to be any consistent or persistent cognitive deficits that could not be easily explained by normal age-related memory changes.  Recommendations:                          As far as recommendations, I do think the patient is doing quite well and I would highly recommend her to continue to do basic types of health maintenance issues including a good regular diet of vegetables and good nutrition along with regular physical activity such as walking and maintaining a very good sleep hygiene pattern.  Other than that, I think the patient is doing very well and there are no patterns consistent with progressive or even acquired dementia types of processes.  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 Neuropsychologist

## 2018-06-09 DIAGNOSIS — E039 Hypothyroidism, unspecified: Secondary | ICD-10-CM | POA: Diagnosis not present

## 2018-06-09 DIAGNOSIS — E782 Mixed hyperlipidemia: Secondary | ICD-10-CM | POA: Diagnosis not present

## 2018-06-09 DIAGNOSIS — Z0000 Encounter for general adult medical examination without abnormal findings: Secondary | ICD-10-CM | POA: Diagnosis not present

## 2018-06-09 DIAGNOSIS — B0229 Other postherpetic nervous system involvement: Secondary | ICD-10-CM | POA: Diagnosis not present

## 2018-06-09 DIAGNOSIS — R7303 Prediabetes: Secondary | ICD-10-CM | POA: Diagnosis not present

## 2018-07-17 NOTE — Progress Notes (Deleted)
PATIENT: [name] DOB: 03/09/1933  REASON FOR VISIT: follow up HISTORY FROM: patient  No chief complaint on file.    HISTORY OF PRESENT ILLNESS: Today 07/17/18 [name] is a 85 y.o. female here today for follow up.    HISTORY: (copied from Dr Cathren Laine note on 12/17/2017)  Interval history 12/17/2017: 85 year old with shingles and now arm weakness. Shingles 10/17/2017, she was treated, went to physical therapy. Her arm hurts all the time and she has no strength. She can't use her arm now. Here with daughter. Developed the rash going from the outer arm to the several first digits and the forearm. Now numb. She went to physical therapy once and therapist recommended neurology as well.   HPI:  [name] is a 85 y.o. female here as a referral from Dr. Harrington Challenger for memory changes.  Past medical history hypothyroidism, hyperlipidemia, memory changes.HLD, memory changes. Here with daughter who also provides information. Sister has dementia diagnosed in the last few years she is 26, No other family history of dementia. Recently her daughters noticed changes, she went to a sale and couldn't find it but hadn't been there in a long while and her direction skills have never been good so not unusual. Daughter also says her memory worsens after her knee surgeries. She is not sleeping well. More short term memory changes. Little things, she remembers appointments, she is starting to write things down because she stop to stop for milk the other day, she doesn't repeat stories or ask the same questions in the same day. She lives alone, drives, maintains her own hoe, was mowing the yard until her knees started bothering her. She doesn't miss medication, she manage all her own finances, she cooks but not as often because she is alone but she does make a lasagna and cooks for families. Doesn't lose keys or wallet or other things in the home. No confusion she otherwise does not get lost and  doesn't get lost going to familiar places.   Reviewed notes, labs and imaging from outside physicians, which showed:  Reviewed referring physician notes, sister has dementia, never smoked, no alcohol, some caffeine, recent memory changes, stays active and eats right, examination was unremarkable.  REVIEW OF SYSTEMS: Out of a complete 14 system review of symptoms, the patient complains only of the following symptoms, and all other reviewed systems are negative.  ALLERGIES: Allergies  Allergen Reactions  . Ampicillin Cough    Has patient had a PCN reaction causing immediate rash, facial/tongue/throat swelling, SOB or lightheadedness with hypotension: No Has patient had a PCN reaction causing severe rash involving mucus membranes or skin necrosis: No Has patient had a PCN reaction that required hospitalization No Has patient had a PCN reaction occurring within the last 10 years: No If all of the above answers are "NO", then may proceed with Cephalosporin use.  . Elastic Bandages & [Zinc]   . Sulfa Antibiotics Cough  . Latex Rash    HOME MEDICATIONS: Outpatient Medications Prior to Visit  Medication Sig Dispense Refill  . Biotin 1000 MCG tablet Take 1,000 mcg by mouth daily.    . Calcium Carbonate-Vitamin D 600-200 MG-UNIT TABS Take 1 tablet by mouth daily.    . Cholecalciferol (VITAMIN D PO) Take 1,000-2,000 Units by mouth daily.    Marland Kitchen gabapentin (NEURONTIN) 300 MG capsule Take 300-600 mg by mouth at bedtime.    . Homeopathic Products (CALENDULA EX) Apply topically.    Marland Kitchen  levothyroxine (SYNTHROID, LEVOTHROID) 88 MCG tablet Take 88 mcg by mouth. Take 1 tablet daily and on Sunday take only 1/2 tablet.    . lidocaine (LIDODERM) 5 % Place 1-2 patches onto the skin daily. Remove & Discard patch within 12 hours or as directed by MD    . MAGNESIUM PO Take 1 tablet by mouth daily.     . Polyethyl Glycol-Propyl Glycol (SYSTANE OP) Apply 1 drop to eye 2 (two) times daily. Both eyes    .  Pyridoxine HCl (VITAMIN B-6 PO) Take 1 tablet by mouth daily.    . traMADol (ULTRAM) 50 MG tablet Take 50 mg by mouth 3 (three) times daily as needed.    . vitamin C (ASCORBIC ACID) 500 MG tablet Take 500 mg by mouth.     No facility-administered medications prior to visit.     PAST MEDICAL HISTORY: Past Medical History:  Diagnosis Date  . Acute blood loss anemia   . Arthritis   . Benign essential HTN    pt denies   . Constipation   . GERD (gastroesophageal reflux disease)    infrequently - otc med as needed  . 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 Hyponatremia   . Hypothyroidism   . Leukocytosis   . MVP (mitral valve prolapse)   . Primary osteoarthritis of right knee   . Rosacea   . Shingles 10/2017  . Unsteady gait     PAST SURGICAL HISTORY: Past Surgical History:  Procedure Laterality Date  . BUNIONECTOMY    . CHOLECYSTECTOMY    . COLONOSCOPY     20 04 & 06/17/12  . FINGER SURGERY     rt thumb  . TOTAL KNEE ARTHROPLASTY Right 02/02/2015   Procedure: TOTAL RIGHT KNEE ARTHROPLASTY;  Surgeon: Jene EveryJeffrey Beane, MD;  Location: [facility];  Service: Orthopedics;  Laterality: Right;  . TOTAL KNEE ARTHROPLASTY Left 09/21/2015   Procedure: LEFT TOTAL KNEE ARTHROPLASTY;  Surgeon: Jene EveryJeffrey Beane, MD;  Location: [facility];  Service: Orthopedics;  Laterality: Left;  . TUBAL LIGATION      FAMILY HISTORY: Family History  Problem Relation Age of Onset  . Stroke Mother        ?  Marland Kitchen. Congestive Heart Failure Father   . Rheum arthritis Sister   . Lung cancer Brother   . Prostate cancer Brother   . Aneurysm Brother        ?  . Lung cancer Brother   . Other Brother        open heart surgery  . Breast cancer Sister   . Dementia Sister   . Fibromyalgia Sister     SOCIAL HISTORY: Social History   Socioeconomic History  . Marital status: Widowed    Spouse name: Not on file  . Number of children: 2  . Years of  education: Not on file  . Highest education level: High school graduate  Occupational History  . Not on file  Social Needs  . Financial resource strain: Not on file  . Food insecurity    Worry: Not on file    Inability: Not on file  . Transportation needs    Medical: Not on file    Non-medical: Not on file  Tobacco Use  . Smoking status: Never Smoker  . Smokeless tobacco: Never Used  Substance and Sexual Activity  . Alcohol use: No  . Drug use: No  .  Sexual activity: Not on file  Lifestyle  . Physical activity    Days per week: Not on file    Minutes per session: Not on file  . Stress: Not on file  Relationships  . Social Musicianconnections    Talks on phone: Not on file    Gets together: Not on file    Attends religious service: Not on file    Active member of club or organization: Not on file    Attends meetings of clubs or organizations: Not on file    Relationship status: Not on file  . Intimate partner violence    Fear of current or ex partner: Not on file    Emotionally abused: Not on file    Physically abused: Not on file    Forced sexual activity: Not on file  Other Topics Concern  . Not on file  Social History Narrative   Lives at home alone   Right handed      PHYSICAL EXAM  There were no vitals filed for this visit. There is no height or weight on file to calculate BMI.  Generalized: Well developed, in no acute distress  Cardiology: normal rate and rhythm, no murmur noted Neurological examination  Mentation: Alert oriented to time, place, history taking. Follows all commands speech and language fluent Cranial nerve II-XII: Pupils were equal round reactive to light. Extraocular movements were full, visual field were full on confrontational test. Facial sensation and strength were normal. Uvula tongue midline. Head turning and shoulder shrug  were normal and symmetric. Motor: The motor testing reveals 5 over 5 strength of all 4 extremities. Good symmetric  motor tone is noted throughout.  Sensory: Sensory testing is intact to soft touch on all 4 extremities. No evidence of extinction is noted.  Coordination: Cerebellar testing reveals good finger-nose-finger and heel-to-shin bilaterally.  Gait and station: Gait is normal. Tandem gait is normal. Romberg is negative. No drift is seen.  Reflexes: Deep tendon reflexes are symmetric and normal bilaterally.   DIAGNOSTIC DATA (LABS, IMAGING, TESTING) - I reviewed patient records, labs, notes, testing and imaging myself where available.  MMSE - Mini Mental State Exam 07/21/2017  Orientation to time 5  Orientation to Place 5  Registration 3  Attention/ Calculation 5  Recall 3  Language- name 2 objects 2  Language- repeat 1  Language- follow 3 step command 3  Language- read & follow direction 1  Write a sentence 1  Copy design 1  Total score 30     Lab Results  Component Value Date   WBC 8.7 09/23/2015   HGB 12.2 09/23/2015   HCT 37.2 09/23/2015   MCV 86.7 09/23/2015   PLT 203 09/23/2015      Component Value Date/Time   NA 136 09/22/2015 0423   NA 141 02/07/2015   K 4.0 09/22/2015 0423   CL 103 09/22/2015 0423   CO2 26 09/22/2015 0423   GLUCOSE 152 (H) 09/22/2015 0423   BUN 8 09/22/2015 0423   BUN 9 02/07/2015   CREATININE 0.60 09/22/2015 0423   CALCIUM 8.8 (L) 09/22/2015 0423   AST 32 02/07/2015   ALT 31 02/07/2015   ALKPHOS 76 02/07/2015   GFRNONAA >60 09/22/2015 0423   GFRAA >60 09/22/2015 0423   No results found for: CHOL, HDL, LDLCALC, LDLDIRECT, TRIG, CHOLHDL No results found for: [id number] Lab Results  Component Value Date   VITAMINB12 375 07/21/2017   No results found for: TSH     ASSESSMENT AND  PLAN 85 y.o. year old female  has a past medical history of Acute blood loss anemia, Arthritis, Benign essential HTN, Constipation, GERD (gastroesophageal reflux disease), Heart murmur, Hyponatremia, Hypothyroidism, Leukocytosis, MVP (mitral valve prolapse), Primary  osteoarthritis of right knee, Rosacea, Shingles (10/2017), and Unsteady gait. here with ***    ICD-10-CM   1. Memory loss  R41.3        No orders of the defined types were placed in this encounter.    No orders of the defined types were placed in this encounter.     I spent 15 minutes with the patient. 50% of this time was spent counseling and educating patient on plan of care and medications.    Shawnie Dappermy Azie Mcconahy, FNP-C 07/17/2018, 2:15 PM [facility] [address], [address] [place], [place] [id number] [phone]

## 2018-07-27 ENCOUNTER — Ambulatory Visit: Payer: PPO | Admitting: Family Medicine

## 2018-07-27 ENCOUNTER — Ambulatory Visit: Payer: PPO | Admitting: Adult Health

## 2019-06-09 DIAGNOSIS — Z0000 Encounter for general adult medical examination without abnormal findings: Secondary | ICD-10-CM | POA: Diagnosis not present

## 2019-06-09 DIAGNOSIS — E782 Mixed hyperlipidemia: Secondary | ICD-10-CM | POA: Diagnosis not present

## 2019-06-09 DIAGNOSIS — R7303 Prediabetes: Secondary | ICD-10-CM | POA: Diagnosis not present

## 2019-06-09 DIAGNOSIS — E039 Hypothyroidism, unspecified: Secondary | ICD-10-CM | POA: Diagnosis not present

## 2019-06-09 DIAGNOSIS — M8580 Other specified disorders of bone density and structure, unspecified site: Secondary | ICD-10-CM | POA: Diagnosis not present

## 2019-06-10 ENCOUNTER — Other Ambulatory Visit: Payer: Self-pay | Admitting: Physician Assistant

## 2019-06-10 DIAGNOSIS — M8580 Other specified disorders of bone density and structure, unspecified site: Secondary | ICD-10-CM

## 2019-06-10 DIAGNOSIS — Z1231 Encounter for screening mammogram for malignant neoplasm of breast: Secondary | ICD-10-CM

## 2019-06-17 DIAGNOSIS — E782 Mixed hyperlipidemia: Secondary | ICD-10-CM | POA: Diagnosis not present

## 2019-06-17 DIAGNOSIS — E559 Vitamin D deficiency, unspecified: Secondary | ICD-10-CM | POA: Diagnosis not present

## 2019-06-17 DIAGNOSIS — R7303 Prediabetes: Secondary | ICD-10-CM | POA: Diagnosis not present

## 2019-06-17 DIAGNOSIS — E039 Hypothyroidism, unspecified: Secondary | ICD-10-CM | POA: Diagnosis not present

## 2019-08-27 ENCOUNTER — Ambulatory Visit
Admission: RE | Admit: 2019-08-27 | Discharge: 2019-08-27 | Disposition: A | Discharge status: Home / Self Care | Payer: PPO | Source: Ambulatory Visit | Attending: Physician Assistant | Admitting: Physician Assistant

## 2019-08-27 ENCOUNTER — Other Ambulatory Visit: Payer: Self-pay

## 2019-08-27 DIAGNOSIS — Z1231 Encounter for screening mammogram for malignant neoplasm of breast: Secondary | ICD-10-CM

## 2019-08-27 DIAGNOSIS — M8580 Other specified disorders of bone density and structure, unspecified site: Secondary | ICD-10-CM

## 2019-08-27 DIAGNOSIS — Z780 Asymptomatic menopausal state: Secondary | ICD-10-CM | POA: Diagnosis not present

## 2019-08-27 DIAGNOSIS — M85852 Other specified disorders of bone density and structure, left thigh: Secondary | ICD-10-CM | POA: Diagnosis not present

## 2019-09-17 DIAGNOSIS — E039 Hypothyroidism, unspecified: Secondary | ICD-10-CM | POA: Diagnosis not present

## 2019-09-17 DIAGNOSIS — R7303 Prediabetes: Secondary | ICD-10-CM | POA: Diagnosis not present

## 2019-09-17 DIAGNOSIS — E782 Mixed hyperlipidemia: Secondary | ICD-10-CM | POA: Diagnosis not present

## 2019-09-17 DIAGNOSIS — E559 Vitamin D deficiency, unspecified: Secondary | ICD-10-CM | POA: Diagnosis not present

## 2019-12-21 DIAGNOSIS — E039 Hypothyroidism, unspecified: Secondary | ICD-10-CM | POA: Diagnosis not present

## 2019-12-27 DIAGNOSIS — M545 Low back pain, unspecified: Secondary | ICD-10-CM | POA: Diagnosis not present

## 2019-12-27 DIAGNOSIS — M2140 Flat foot [pes planus] (acquired), unspecified foot: Secondary | ICD-10-CM | POA: Diagnosis not present

## 2020-02-20 IMAGING — MR MR CHEST MEDIASTINUM WO/W CM
11 series · 16 of 16 positions shown · IV contrast (multihance)
Comparison: None

CLINICAL DATA: Left arm weakness following shingles in October 2017

EXAM:
MRI CHEST WITHOUT AND WITH CONTRAST
TECHNIQUE: Multiplanar multi echo sequences were performed before and after
intravenous administration of MultiHance.
CONTRAST:  12mL MULTIHANCE GADOBENATE DIMEGLUMINE 529 MG/ML IV SOLN

[Series 9: T2 fat-sat · axial · 3.0mm · 0.70mm/px · z∈[-178,-54]mm · 2 of 35 slices shown]
[im 1/35]
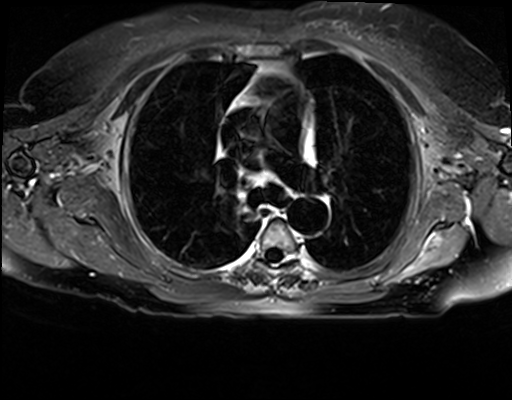
[im 35/35]
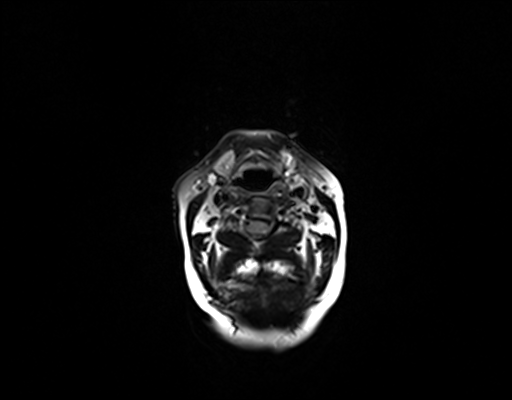

[Series 10: T1 · axial · 3.0mm · 1.06mm/px · z∈[-176,-52]mm · 2 of 35 slices shown (1 of 3)]
[im 1/35]
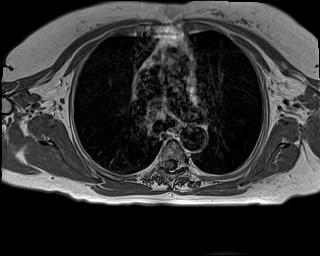
[im 35/35]
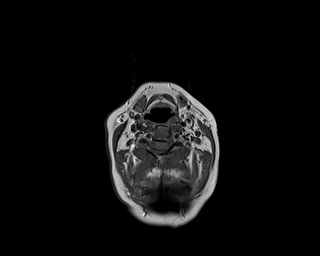

[Series 11: STIR · coronal · 3.0mm · 0.47mm/px · 1 of 30 slices shown]
[im 1/30]
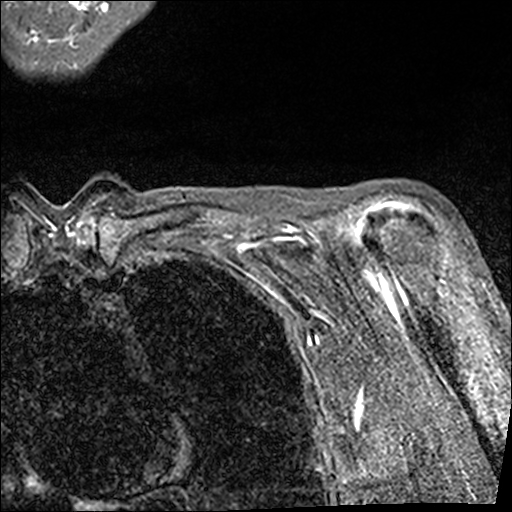

[Series 12: T1 · coronal · 3.0mm · 0.75mm/px · 1 of 30 slices shown (2 of 3)]
[im 1/30]
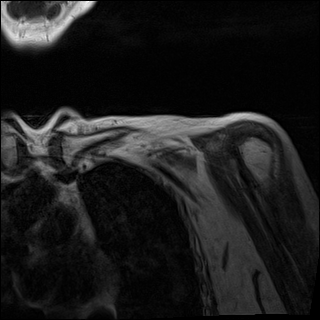

[Series 13: t1_tse sag · sagittal · 3.0mm · 0.75mm/px · 2 of 43 slices shown]
[im 1/43]
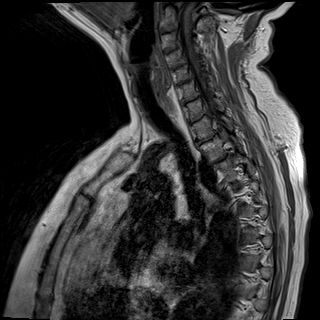
[im 43/43]
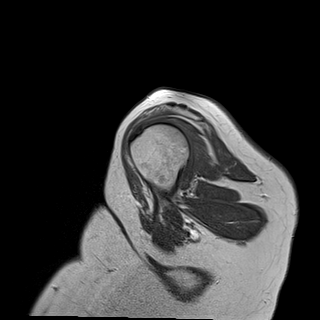

[Series 14: T2 · sagittal · 3.0mm · 0.47mm/px · 2 of 43 slices shown]
[im 1/43]
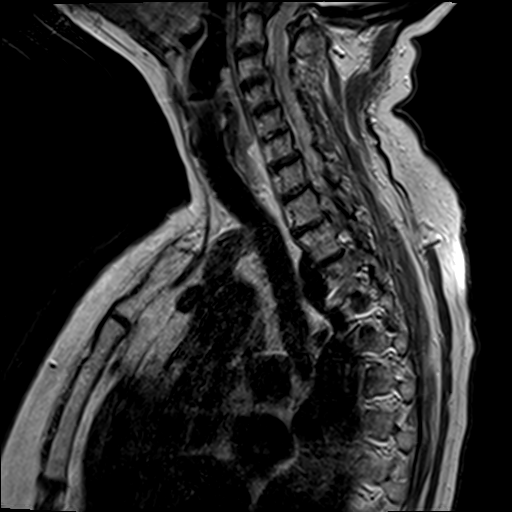
[im 43/43]
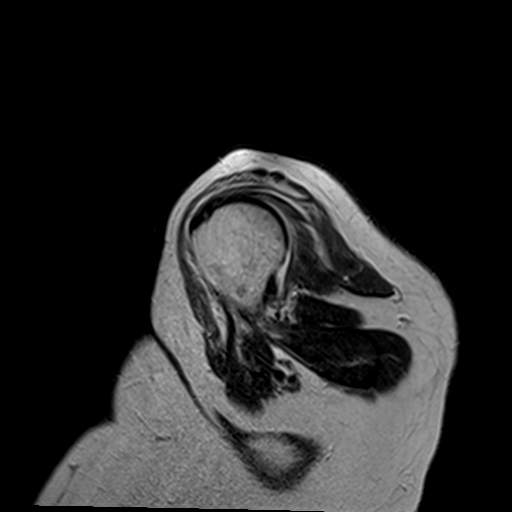

[Series 15: T1 fat-sat · axial · non-contrast · 3.0mm · 1.12mm/px · 1 of 35 slices shown]
[im 1/35]
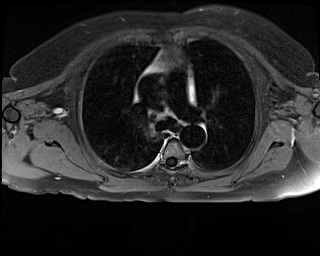

[Series 16: T1 · axial · 3.0mm · 0.35mm/px · 1 of 22 slices shown (3 of 3)]
[im 1/22]
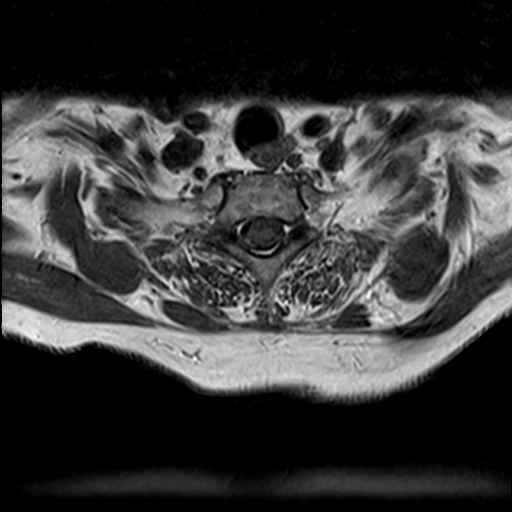

[Series 17: post axial fs · axial · 3.0mm · 1.12mm/px · 1 of 35 slices shown]
[im 1/35]
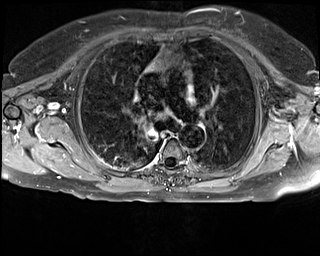

[Series 18: post sag fs · sagittal · 3.0mm · 0.75mm/px · 2 of 43 slices shown]
[im 1/43]
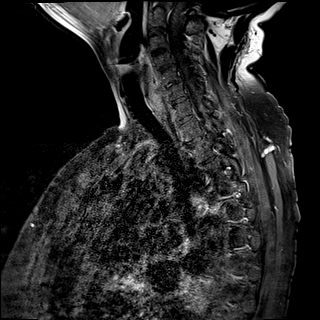
[im 43/43]
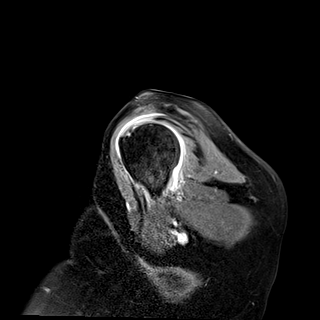

[Series 19: post cor fs · coronal · 3.0mm · 0.75mm/px · 1 of 35 slices shown]
[im 1/35]
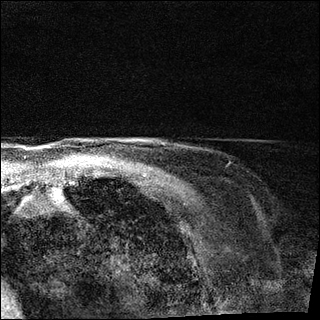

[16 of 16 positions shown; findings below may reference images not displayed]

FINDINGS: There is abnormal edema, atrophy and enhancement of the left
supraspinatus and subscapularis muscles. There is abnormal
enhancement of the upper trunk of the left brachial plexus seen on
series 17 and series 18 after contrast administration.

The patient also has a prominent glenohumeral joint effusion with
debris in the joint and glenohumeral joint osteoarthritic changes.

The patient also has calcific tendinopathy of the anterior aspect of
the distal supraspinatus tendon with inflammation of the overlying
subdeltoid/subacromial bursa. There is no discrete rotator cuff
tear.

There is AC joint arthropathy with a joint effusion.

There is a single 6 mm lymph node in the left supraclavicular region
adjacent to the left jugular vein, enhancing after contrast. This is
not felt to be significant.
IMPRESSION: 1. Findings consistent with zoster-associated left brachial
plexopathy with atrophy, edema, and abnormal enhancement of the
supraspinatus and subscapularis muscles of the left shoulder.
2. Abnormal enhancement of the upper trunk of the left brachial
plexus consistent with inflammation of that branch of the brachial
plexus.
3. Arthritic changes of the left glenohumeral joint with a joint
effusion and debris in the joint.
4. Calcific tendinopathy of the distal supraspinatus tendon with
secondary inflammation of the overlying subdeltoid/subacromial
bursa.

## 2020-06-14 DIAGNOSIS — Z0000 Encounter for general adult medical examination without abnormal findings: Secondary | ICD-10-CM | POA: Diagnosis not present

## 2020-06-14 DIAGNOSIS — E039 Hypothyroidism, unspecified: Secondary | ICD-10-CM | POA: Diagnosis not present

## 2020-06-14 DIAGNOSIS — E559 Vitamin D deficiency, unspecified: Secondary | ICD-10-CM | POA: Diagnosis not present

## 2020-06-14 DIAGNOSIS — E782 Mixed hyperlipidemia: Secondary | ICD-10-CM | POA: Diagnosis not present

## 2020-06-14 DIAGNOSIS — Z1389 Encounter for screening for other disorder: Secondary | ICD-10-CM | POA: Diagnosis not present

## 2020-06-14 DIAGNOSIS — R7303 Prediabetes: Secondary | ICD-10-CM | POA: Diagnosis not present

## 2020-06-19 DIAGNOSIS — R7303 Prediabetes: Secondary | ICD-10-CM | POA: Diagnosis not present

## 2020-06-19 DIAGNOSIS — M8580 Other specified disorders of bone density and structure, unspecified site: Secondary | ICD-10-CM | POA: Diagnosis not present

## 2020-06-19 DIAGNOSIS — R609 Edema, unspecified: Secondary | ICD-10-CM | POA: Diagnosis not present

## 2020-06-19 DIAGNOSIS — E782 Mixed hyperlipidemia: Secondary | ICD-10-CM | POA: Diagnosis not present

## 2020-06-19 DIAGNOSIS — Z0000 Encounter for general adult medical examination without abnormal findings: Secondary | ICD-10-CM | POA: Diagnosis not present

## 2020-06-19 DIAGNOSIS — E559 Vitamin D deficiency, unspecified: Secondary | ICD-10-CM | POA: Diagnosis not present

## 2020-06-19 DIAGNOSIS — E039 Hypothyroidism, unspecified: Secondary | ICD-10-CM | POA: Diagnosis not present

## 2020-06-23 ENCOUNTER — Other Ambulatory Visit: Payer: Self-pay | Admitting: Family Medicine

## 2020-06-23 DIAGNOSIS — Z1231 Encounter for screening mammogram for malignant neoplasm of breast: Secondary | ICD-10-CM

## 2020-08-28 ENCOUNTER — Other Ambulatory Visit: Payer: Self-pay

## 2020-08-28 ENCOUNTER — Ambulatory Visit
Admission: RE | Admit: 2020-08-28 | Discharge: 2020-08-28 | Disposition: A | Discharge status: Home / Self Care | Payer: PPO | Source: Ambulatory Visit | Attending: Family Medicine | Admitting: Family Medicine

## 2020-08-28 DIAGNOSIS — Z1231 Encounter for screening mammogram for malignant neoplasm of breast: Secondary | ICD-10-CM | POA: Diagnosis not present

## 2020-09-19 DIAGNOSIS — Z0000 Encounter for general adult medical examination without abnormal findings: Secondary | ICD-10-CM | POA: Diagnosis not present

## 2020-10-20 DIAGNOSIS — E039 Hypothyroidism, unspecified: Secondary | ICD-10-CM | POA: Diagnosis not present

## 2020-10-20 DIAGNOSIS — L609 Nail disorder, unspecified: Secondary | ICD-10-CM | POA: Diagnosis not present

## 2020-10-20 DIAGNOSIS — Z6833 Body mass index (BMI) 33.0-33.9, adult: Secondary | ICD-10-CM | POA: Diagnosis not present

## 2021-01-17 DIAGNOSIS — L609 Nail disorder, unspecified: Secondary | ICD-10-CM | POA: Diagnosis not present

## 2021-01-17 DIAGNOSIS — E039 Hypothyroidism, unspecified: Secondary | ICD-10-CM | POA: Diagnosis not present

## 2021-01-26 DIAGNOSIS — E039 Hypothyroidism, unspecified: Secondary | ICD-10-CM | POA: Diagnosis not present

## 2021-01-26 DIAGNOSIS — Z79890 Hormone replacement therapy (postmenopausal): Secondary | ICD-10-CM | POA: Diagnosis not present

## 2021-06-22 DIAGNOSIS — E782 Mixed hyperlipidemia: Secondary | ICD-10-CM | POA: Diagnosis not present

## 2021-06-22 DIAGNOSIS — R609 Edema, unspecified: Secondary | ICD-10-CM | POA: Diagnosis not present

## 2021-06-22 DIAGNOSIS — E039 Hypothyroidism, unspecified: Secondary | ICD-10-CM | POA: Diagnosis not present

## 2021-06-22 DIAGNOSIS — E559 Vitamin D deficiency, unspecified: Secondary | ICD-10-CM | POA: Diagnosis not present

## 2021-06-22 DIAGNOSIS — R7303 Prediabetes: Secondary | ICD-10-CM | POA: Diagnosis not present

## 2021-06-27 DIAGNOSIS — B0229 Other postherpetic nervous system involvement: Secondary | ICD-10-CM | POA: Diagnosis not present

## 2021-06-27 DIAGNOSIS — Z6832 Body mass index (BMI) 32.0-32.9, adult: Secondary | ICD-10-CM | POA: Diagnosis not present

## 2021-06-27 DIAGNOSIS — E039 Hypothyroidism, unspecified: Secondary | ICD-10-CM | POA: Diagnosis not present

## 2021-06-27 DIAGNOSIS — R609 Edema, unspecified: Secondary | ICD-10-CM | POA: Diagnosis not present

## 2021-06-27 DIAGNOSIS — E782 Mixed hyperlipidemia: Secondary | ICD-10-CM | POA: Diagnosis not present

## 2021-06-27 DIAGNOSIS — R7303 Prediabetes: Secondary | ICD-10-CM | POA: Diagnosis not present

## 2021-06-27 DIAGNOSIS — M179 Osteoarthritis of knee, unspecified: Secondary | ICD-10-CM | POA: Diagnosis not present

## 2021-06-27 DIAGNOSIS — E559 Vitamin D deficiency, unspecified: Secondary | ICD-10-CM | POA: Diagnosis not present

## 2021-06-27 DIAGNOSIS — Z0000 Encounter for general adult medical examination without abnormal findings: Secondary | ICD-10-CM | POA: Diagnosis not present

## 2021-07-24 DIAGNOSIS — E039 Hypothyroidism, unspecified: Secondary | ICD-10-CM | POA: Diagnosis not present

## 2021-07-24 DIAGNOSIS — Z79890 Hormone replacement therapy (postmenopausal): Secondary | ICD-10-CM | POA: Diagnosis not present

## 2022-02-04 DIAGNOSIS — H35371 Puckering of macula, right eye: Secondary | ICD-10-CM | POA: Diagnosis not present

## 2022-02-11 DIAGNOSIS — H35371 Puckering of macula, right eye: Secondary | ICD-10-CM | POA: Diagnosis not present

## 2022-02-11 DIAGNOSIS — H2511 Age-related nuclear cataract, right eye: Secondary | ICD-10-CM | POA: Diagnosis not present

## 2022-02-11 DIAGNOSIS — H2512 Age-related nuclear cataract, left eye: Secondary | ICD-10-CM | POA: Diagnosis not present

## 2022-03-04 DIAGNOSIS — H2511 Age-related nuclear cataract, right eye: Secondary | ICD-10-CM | POA: Diagnosis not present

## 2022-03-04 DIAGNOSIS — H2513 Age-related nuclear cataract, bilateral: Secondary | ICD-10-CM | POA: Diagnosis not present

## 2022-03-04 DIAGNOSIS — H35371 Puckering of macula, right eye: Secondary | ICD-10-CM | POA: Diagnosis not present

## 2022-03-26 DIAGNOSIS — Z6831 Body mass index (BMI) 31.0-31.9, adult: Secondary | ICD-10-CM | POA: Diagnosis not present

## 2022-03-26 DIAGNOSIS — Z888 Allergy status to other drugs, medicaments and biological substances status: Secondary | ICD-10-CM | POA: Diagnosis not present

## 2022-03-26 DIAGNOSIS — I341 Nonrheumatic mitral (valve) prolapse: Secondary | ICD-10-CM | POA: Diagnosis not present

## 2022-03-26 DIAGNOSIS — H2513 Age-related nuclear cataract, bilateral: Secondary | ICD-10-CM | POA: Diagnosis not present

## 2022-03-26 DIAGNOSIS — E039 Hypothyroidism, unspecified: Secondary | ICD-10-CM | POA: Diagnosis not present

## 2022-03-26 DIAGNOSIS — Z79899 Other long term (current) drug therapy: Secondary | ICD-10-CM | POA: Diagnosis not present

## 2022-03-26 DIAGNOSIS — Z91040 Latex allergy status: Secondary | ICD-10-CM | POA: Diagnosis not present

## 2022-03-26 DIAGNOSIS — H2511 Age-related nuclear cataract, right eye: Secondary | ICD-10-CM | POA: Diagnosis not present

## 2022-03-26 DIAGNOSIS — E669 Obesity, unspecified: Secondary | ICD-10-CM | POA: Diagnosis not present

## 2022-03-27 DIAGNOSIS — H2512 Age-related nuclear cataract, left eye: Secondary | ICD-10-CM | POA: Diagnosis not present

## 2022-04-16 DIAGNOSIS — H2513 Age-related nuclear cataract, bilateral: Secondary | ICD-10-CM | POA: Diagnosis not present

## 2022-04-16 DIAGNOSIS — Z79899 Other long term (current) drug therapy: Secondary | ICD-10-CM | POA: Diagnosis not present

## 2022-04-16 DIAGNOSIS — Z882 Allergy status to sulfonamides status: Secondary | ICD-10-CM | POA: Diagnosis not present

## 2022-04-16 DIAGNOSIS — Z79890 Hormone replacement therapy (postmenopausal): Secondary | ICD-10-CM | POA: Diagnosis not present

## 2022-04-16 DIAGNOSIS — Z86718 Personal history of other venous thrombosis and embolism: Secondary | ICD-10-CM | POA: Diagnosis not present

## 2022-04-16 DIAGNOSIS — I341 Nonrheumatic mitral (valve) prolapse: Secondary | ICD-10-CM | POA: Diagnosis not present

## 2022-04-16 DIAGNOSIS — Z6830 Body mass index (BMI) 30.0-30.9, adult: Secondary | ICD-10-CM | POA: Diagnosis not present

## 2022-04-16 DIAGNOSIS — E039 Hypothyroidism, unspecified: Secondary | ICD-10-CM | POA: Diagnosis not present

## 2022-04-16 DIAGNOSIS — H2512 Age-related nuclear cataract, left eye: Secondary | ICD-10-CM | POA: Diagnosis not present

## 2022-04-16 DIAGNOSIS — Z881 Allergy status to other antibiotic agents status: Secondary | ICD-10-CM | POA: Diagnosis not present

## 2022-04-16 DIAGNOSIS — Z880 Allergy status to penicillin: Secondary | ICD-10-CM | POA: Diagnosis not present

## 2022-04-16 DIAGNOSIS — E669 Obesity, unspecified: Secondary | ICD-10-CM | POA: Diagnosis not present

## 2022-04-16 DIAGNOSIS — Z91040 Latex allergy status: Secondary | ICD-10-CM | POA: Diagnosis not present

## 2022-04-30 DIAGNOSIS — H35373 Puckering of macula, bilateral: Secondary | ICD-10-CM | POA: Diagnosis not present

## 2022-04-30 DIAGNOSIS — H43821 Vitreomacular adhesion, right eye: Secondary | ICD-10-CM | POA: Diagnosis not present

## 2022-05-14 DIAGNOSIS — H43821 Vitreomacular adhesion, right eye: Secondary | ICD-10-CM | POA: Diagnosis not present

## 2022-05-14 DIAGNOSIS — H35373 Puckering of macula, bilateral: Secondary | ICD-10-CM | POA: Diagnosis not present

## 2022-05-29 DIAGNOSIS — H35371 Puckering of macula, right eye: Secondary | ICD-10-CM | POA: Diagnosis not present

## 2022-06-06 DIAGNOSIS — H35373 Puckering of macula, bilateral: Secondary | ICD-10-CM | POA: Diagnosis not present

## 2022-06-06 DIAGNOSIS — H43821 Vitreomacular adhesion, right eye: Secondary | ICD-10-CM | POA: Diagnosis not present

## 2022-07-02 DIAGNOSIS — E559 Vitamin D deficiency, unspecified: Secondary | ICD-10-CM | POA: Diagnosis not present

## 2022-07-02 DIAGNOSIS — E669 Obesity, unspecified: Secondary | ICD-10-CM | POA: Diagnosis not present

## 2022-07-02 DIAGNOSIS — R7303 Prediabetes: Secondary | ICD-10-CM | POA: Diagnosis not present

## 2022-07-02 DIAGNOSIS — E039 Hypothyroidism, unspecified: Secondary | ICD-10-CM | POA: Diagnosis not present

## 2022-07-02 DIAGNOSIS — E782 Mixed hyperlipidemia: Secondary | ICD-10-CM | POA: Diagnosis not present

## 2022-07-02 DIAGNOSIS — Z0000 Encounter for general adult medical examination without abnormal findings: Secondary | ICD-10-CM | POA: Diagnosis not present

## 2022-07-09 DIAGNOSIS — E782 Mixed hyperlipidemia: Secondary | ICD-10-CM | POA: Diagnosis not present

## 2022-07-09 DIAGNOSIS — Z0000 Encounter for general adult medical examination without abnormal findings: Secondary | ICD-10-CM | POA: Diagnosis not present

## 2022-07-09 DIAGNOSIS — E039 Hypothyroidism, unspecified: Secondary | ICD-10-CM | POA: Diagnosis not present

## 2022-07-09 DIAGNOSIS — Z79899 Other long term (current) drug therapy: Secondary | ICD-10-CM | POA: Diagnosis not present

## 2022-07-09 DIAGNOSIS — M8588 Other specified disorders of bone density and structure, other site: Secondary | ICD-10-CM | POA: Diagnosis not present

## 2022-07-09 DIAGNOSIS — R7303 Prediabetes: Secondary | ICD-10-CM | POA: Diagnosis not present

## 2022-07-09 DIAGNOSIS — E559 Vitamin D deficiency, unspecified: Secondary | ICD-10-CM | POA: Diagnosis not present

## 2022-07-09 DIAGNOSIS — Z6831 Body mass index (BMI) 31.0-31.9, adult: Secondary | ICD-10-CM | POA: Diagnosis not present

## 2022-07-09 DIAGNOSIS — M179 Osteoarthritis of knee, unspecified: Secondary | ICD-10-CM | POA: Diagnosis not present

## 2022-07-23 DIAGNOSIS — H35363 Drusen (degenerative) of macula, bilateral: Secondary | ICD-10-CM | POA: Diagnosis not present

## 2022-08-15 DIAGNOSIS — H43821 Vitreomacular adhesion, right eye: Secondary | ICD-10-CM | POA: Diagnosis not present

## 2022-08-15 DIAGNOSIS — H35373 Puckering of macula, bilateral: Secondary | ICD-10-CM | POA: Diagnosis not present

## 2023-04-23 DIAGNOSIS — Z6830 Body mass index (BMI) 30.0-30.9, adult: Secondary | ICD-10-CM | POA: Diagnosis not present

## 2023-04-23 DIAGNOSIS — J029 Acute pharyngitis, unspecified: Secondary | ICD-10-CM | POA: Diagnosis not present

## 2023-04-30 DIAGNOSIS — J029 Acute pharyngitis, unspecified: Secondary | ICD-10-CM | POA: Diagnosis not present

## 2023-04-30 DIAGNOSIS — H539 Unspecified visual disturbance: Secondary | ICD-10-CM | POA: Diagnosis not present

## 2023-04-30 DIAGNOSIS — R509 Fever, unspecified: Secondary | ICD-10-CM | POA: Diagnosis not present

## 2023-04-30 DIAGNOSIS — Z6829 Body mass index (BMI) 29.0-29.9, adult: Secondary | ICD-10-CM | POA: Diagnosis not present

## 2023-05-02 ENCOUNTER — Ambulatory Visit (INDEPENDENT_AMBULATORY_CARE_PROVIDER_SITE_OTHER): Admitting: Otolaryngology

## 2023-05-02 ENCOUNTER — Encounter (INDEPENDENT_AMBULATORY_CARE_PROVIDER_SITE_OTHER): Payer: Self-pay | Admitting: Otolaryngology

## 2023-05-02 VITALS — BP 129/75 | HR 113

## 2023-05-02 DIAGNOSIS — R1310 Dysphagia, unspecified: Secondary | ICD-10-CM

## 2023-05-02 DIAGNOSIS — J069 Acute upper respiratory infection, unspecified: Secondary | ICD-10-CM

## 2023-05-02 DIAGNOSIS — J312 Chronic pharyngitis: Secondary | ICD-10-CM

## 2023-05-02 DIAGNOSIS — K117 Disturbances of salivary secretion: Secondary | ICD-10-CM

## 2023-05-02 NOTE — Patient Instructions (Addendum)
 Biotin mouthwash  Finish antibiotics  See PCP if you continue to have fevers, or develop new symptoms, or your symptoms will not get better

## 2023-05-02 NOTE — Progress Notes (Signed)
 ENT CONSULT:  Reason for Consult: sore throat xerostomia odynophagia x 2 weeks   HPI: Discussed the use of AI scribe software for clinical note transcription with the patient, who gave verbal consent to proceed.  History of Present Illness [name] is an 89 year old female who presents with severe dry mouth and throat discomfort. She was referred by her primary care doctor for further evaluation of her symptoms.  She has been experiencing a very dry and painful throat, rated as 7 to 8 out of 10 on the pain scale when swallowing, significantly impacting her ability to eat and drink. The symptoms began approximately three weeks ago, following a dental visit where a crown was placed. She believes the symptoms started about a week after this visit.  She has seen her primary care doctor twice for these symptoms. Initially, she was prescribed a swish and swallow liquid, Nystatin, because it was thought she had thrush, which did not alleviate her symptoms. Subsequently, she was prescribed amoxicillin, which she has been taking for a week without improvement. Her primary care doctor initially suspected thrush due to a white coating on her tongue. The nystatin helped reduce the whiteness on her tongue, but her throat discomfort persisted.  She reports drinking a lot of water due to the dryness and has been experiencing a lot of mucus or phlegm, which she frequently spits up. Her eyes have been sensitive to light, and she has had a fever, with the highest recorded at 101.28F, bilateral temple area headache. She has been taking Tylenol  to manage the fever, which has helped reduce it to around 44F.  Her current medication includes Synthroid  for thyroid  management. She has not been taking any medications for blood pressure. She has tried using a product for dry mouth but only used it once.  She has been sleeping in a recliner due to discomfort and difficulty turning in bed. Her daughter has been  providing Ensure to supplement her nutrition due to her reduced food intake.    Past Medical History:  Diagnosis Date   Acute blood loss anemia    Arthritis    Benign essential HTN    pt denies    Constipation    GERD (gastroesophageal reflux disease)    infrequently - otc med as needed   Heart murmur    "leacky heart valve"agrivated by caffiene / activity - no heart studies have been done per pt   Hyponatremia    Hypothyroidism    Leukocytosis    MVP (mitral valve prolapse)    Primary osteoarthritis of right knee    Rosacea    Shingles 10/2017   Unsteady gait     Past Surgical History:  Procedure Laterality Date   BUNIONECTOMY     CHOLECYSTECTOMY     COLONOSCOPY     2004 & 06/17/12   FINGER SURGERY     rt thumb   TOTAL KNEE ARTHROPLASTY Right 02/02/2015   Procedure: TOTAL RIGHT KNEE ARTHROPLASTY;  Surgeon: Orvan Blanch, MD;  Location: [facility];  Service: Orthopedics;  Laterality: Right;   TOTAL KNEE ARTHROPLASTY Left 09/21/2015   Procedure: LEFT TOTAL KNEE ARTHROPLASTY;  Surgeon: Orvan Blanch, MD;  Location: [facility];  Service: Orthopedics;  Laterality: Left;   TUBAL LIGATION      Family History  Problem Relation Age of Onset   Stroke Mother        ?   Congestive Heart Failure Father    Rheum arthritis Sister  Lung cancer Brother    Prostate cancer Brother    Aneurysm Brother        ?   Lung cancer Brother    Other Brother        open heart surgery   Breast cancer Sister    Dementia Sister    Fibromyalgia Sister     Social History:  reports that she has never smoked. She has never used smokeless tobacco. She reports that she does not drink alcohol  and does not use drugs.  Allergies:  Allergies  Allergen Reactions   Ampicillin Cough    Has patient had a PCN reaction causing immediate rash, facial/tongue/throat swelling, SOB or lightheadedness with hypotension: No Has patient had a PCN reaction causing severe rash involving mucus membranes or skin necrosis:  No Has patient had a PCN reaction that required hospitalization No Has patient had a PCN reaction occurring within the last 10 years: No If all of the above answers are "NO", then may proceed with Cephalosporin use.   Elastic Bandages & [Zinc]    Sulfa  Antibiotics Cough   Latex Rash    Medications: I have reviewed the patient's current medications.  The PMH, PSH, Medications, Allergies, and SH were reviewed and updated.  ROS: Constitutional: (+) fever,  no weight loss and weight gain. Cardiovascular: Negative for chest pain and dyspnea on exertion. Respiratory: Is not experiencing shortness of breath at rest. Gastrointestinal: Negative for nausea and vomiting. Neurological: (+) headaches. Psychiatric: The patient is not nervous/anxious  Blood pressure 129/75, pulse (!) 113, SpO2 96%. There is no height or weight on file to calculate BMI.  PHYSICAL EXAM:  Exam: General: Well-developed, well-nourished Respiratory Respiratory effort: Equal inspiration and expiration without stridor Cardiovascular Peripheral Vascular: Warm extremities with equal color/perfusion Eyes: No nystagmus with equal extraocular motion bilaterally Neuro/Psych/Balance: Patient oriented to person, place, and time; Appropriate mood and affect; Gait is unsteady with cain (baseline); Cranial nerves I-XII are intact Head and Face Inspection: Normocephalic and atraumatic without mass or lesion Palpation: Facial skeleton intact without bony stepoffs Salivary Glands: Both SMG glands feel firm, palpable stones along left Wharton duct Facial Strength: Facial motility symmetric and full bilaterally ENT Pinna: External ear intact and fully developed External canal: Canal is patent with intact skin Tympanic Membrane: Clear and mobile External Nose: No scar or anatomic deformity Internal Nose: Septum is relatively straight. No polyp, or purulence. Mucosal edema and erythema present.  Bilateral inferior turbinate  hypertrophy.  Lips, Teeth, and gums: Mucosa and teeth intact and viable  TMJ: No pain to palpation with full mobility Oral cavity/oropharynx: No erythema or exudate, no lesions present dry mucosal and dorsal tongue Nasopharynx: No mass or lesion with intact mucosa Hypopharynx: Intact mucosa without pooling of secretions Larynx Glottic: Full true vocal cord mobility without lesion or mass Supraglottic: Normal appearing epiglottis and AE folds Interarytenoid Space: Moderate pachydermia&edema Subglottic Space: Patent without lesion or edema Neck Neck and Trachea: Midline trachea without mass or lesion Thyroid : No mass or nodularity Lymphatics: No lymphadenopathy  Procedure: Preoperative diagnosis: sore throat, odynophagia   Postoperative diagnosis:   Same  Procedure: Flexible fiberoptic laryngoscopy  Surgeon: Artice Last, MD  Anesthesia: Topical lidocaine  and Afrin Complications: None Condition is stable throughout exam  Indications and consent:  The patient presents to the clinic with above symptoms. Indirect laryngoscopy view was incomplete. Thus it was recommended that they undergo a flexible fiberoptic laryngoscopy. All of the risks, benefits, and potential complications were reviewed with the patient preoperatively and  verbal informed consent was obtained.  Procedure: The patient was seated upright in the clinic. Topical lidocaine  and Afrin were applied to the nasal cavity. After adequate anesthesia had occurred, I then proceeded to pass the flexible telescope into the nasal cavity. The nasal cavity was patent without rhinorrhea or polyp. The nasopharynx was also patent without mass or lesion. The base of tongue was visualized and was normal. There were no signs of pooling of secretions in the piriform sinuses. The true vocal folds were mobile bilaterally. There were no signs of glottic or supraglottic mucosal lesion or mass. There was moderate interarytenoid pachydermia and  post cricoid edema. The telescope was then slowly withdrawn and the patient tolerated the procedure throughout.    Assessment/Plan: Encounter Diagnoses  Name Primary?   Chronic sore throat Yes   Xerostomia     Assessment and Plan Assessment & Plan Dry mouth and throat discomfort x 2 weeks, pain with swallowing xerostomia and throat discomfort post-dental visit with severe dryness and headaches, fever, malaise. Viral infection suspected. No bacterial infection or oral thrush signs on exam including flexible laryngoscopy, no lesions or masses on oral exam or flexible laryngoscopy exam. Salivary gland  exam with b/l SMG firmness, but no lymphadenopathy, dry mucous membranes, could be related to URI. Will need CT neck w/con to rule out stones, sialoadenitis. - Recommend lemon candy or lemon cough drops to stimulate salivation. - Advised Biotene mouthwash for xerostomia relief. - Suggest Xylimelts for symptom relief, available OTC. - Encourage adequate hydration. - Order neck CT scan w/com to evaluate salivary glands for stones/chronic sialoadenitis, non-urgent. - Advised follow-up with primary care for workup and management of other sx such as fevers, headache - advised to consider Sjogren's panel  - Consider going to ER visit if symptoms worsen or persist.    Thank you for allowing me to participate in the care of this patient. Please do not hesitate to contact me with any questions or concerns.   Artice Last, MD Otolaryngology [facility] Phone: [phone] Fax: [phone]    05/02/2023, 10:28 AM

## 2023-05-08 ENCOUNTER — Emergency Department (HOSPITAL_COMMUNITY)

## 2023-05-08 ENCOUNTER — Inpatient Hospital Stay (HOSPITAL_COMMUNITY)

## 2023-05-08 ENCOUNTER — Encounter (HOSPITAL_COMMUNITY): Payer: Self-pay

## 2023-05-08 ENCOUNTER — Inpatient Hospital Stay (HOSPITAL_COMMUNITY)
Admission: EM | Admit: 2023-05-08 | Discharge: 2023-05-12 | DRG: 515 | Disposition: A | Discharge status: Home / Self Care | Source: Ambulatory Visit | Attending: Internal Medicine | Admitting: Internal Medicine

## 2023-05-08 ENCOUNTER — Other Ambulatory Visit: Payer: Self-pay

## 2023-05-08 DIAGNOSIS — E0781 Sick-euthyroid syndrome: Secondary | ICD-10-CM | POA: Diagnosis not present

## 2023-05-08 DIAGNOSIS — R634 Abnormal weight loss: Secondary | ICD-10-CM | POA: Diagnosis not present

## 2023-05-08 DIAGNOSIS — I110 Hypertensive heart disease with heart failure: Secondary | ICD-10-CM | POA: Diagnosis not present

## 2023-05-08 DIAGNOSIS — Z91048 Other nonmedicinal substance allergy status: Secondary | ICD-10-CM

## 2023-05-08 DIAGNOSIS — R627 Adult failure to thrive: Secondary | ICD-10-CM | POA: Diagnosis not present

## 2023-05-08 DIAGNOSIS — I82433 Acute embolism and thrombosis of popliteal vein, bilateral: Secondary | ICD-10-CM | POA: Diagnosis present

## 2023-05-08 DIAGNOSIS — F32A Depression, unspecified: Secondary | ICD-10-CM | POA: Diagnosis present

## 2023-05-08 DIAGNOSIS — I5021 Acute systolic (congestive) heart failure: Secondary | ICD-10-CM | POA: Insufficient documentation

## 2023-05-08 DIAGNOSIS — R7401 Elevation of levels of liver transaminase levels: Secondary | ICD-10-CM | POA: Diagnosis not present

## 2023-05-08 DIAGNOSIS — H5462 Unqualified visual loss, left eye, normal vision right eye: Principal | ICD-10-CM

## 2023-05-08 DIAGNOSIS — Z8261 Family history of arthritis: Secondary | ICD-10-CM

## 2023-05-08 DIAGNOSIS — I2699 Other pulmonary embolism without acute cor pulmonale: Secondary | ICD-10-CM | POA: Diagnosis present

## 2023-05-08 DIAGNOSIS — E039 Hypothyroidism, unspecified: Secondary | ICD-10-CM | POA: Diagnosis not present

## 2023-05-08 DIAGNOSIS — Z981 Arthrodesis status: Secondary | ICD-10-CM | POA: Diagnosis not present

## 2023-05-08 DIAGNOSIS — E785 Hyperlipidemia, unspecified: Secondary | ICD-10-CM | POA: Diagnosis not present

## 2023-05-08 DIAGNOSIS — Z880 Allergy status to penicillin: Secondary | ICD-10-CM

## 2023-05-08 DIAGNOSIS — I82493 Acute embolism and thrombosis of other specified deep vein of lower extremity, bilateral: Secondary | ICD-10-CM | POA: Diagnosis not present

## 2023-05-08 DIAGNOSIS — J9811 Atelectasis: Secondary | ICD-10-CM | POA: Diagnosis not present

## 2023-05-08 DIAGNOSIS — R519 Headache, unspecified: Secondary | ICD-10-CM

## 2023-05-08 DIAGNOSIS — R7989 Other specified abnormal findings of blood chemistry: Secondary | ICD-10-CM | POA: Diagnosis not present

## 2023-05-08 DIAGNOSIS — I6523 Occlusion and stenosis of bilateral carotid arteries: Secondary | ICD-10-CM | POA: Diagnosis not present

## 2023-05-08 DIAGNOSIS — I6782 Cerebral ischemia: Secondary | ICD-10-CM | POA: Diagnosis not present

## 2023-05-08 DIAGNOSIS — H47012 Ischemic optic neuropathy, left eye: Secondary | ICD-10-CM | POA: Diagnosis not present

## 2023-05-08 DIAGNOSIS — N83202 Unspecified ovarian cyst, left side: Secondary | ICD-10-CM | POA: Diagnosis present

## 2023-05-08 DIAGNOSIS — I82452 Acute embolism and thrombosis of left peroneal vein: Secondary | ICD-10-CM | POA: Diagnosis not present

## 2023-05-08 DIAGNOSIS — I5043 Acute on chronic combined systolic (congestive) and diastolic (congestive) heart failure: Secondary | ICD-10-CM | POA: Diagnosis present

## 2023-05-08 DIAGNOSIS — R7303 Prediabetes: Secondary | ICD-10-CM | POA: Diagnosis not present

## 2023-05-08 DIAGNOSIS — J90 Pleural effusion, not elsewhere classified: Secondary | ICD-10-CM | POA: Diagnosis not present

## 2023-05-08 DIAGNOSIS — N83201 Unspecified ovarian cyst, right side: Secondary | ICD-10-CM | POA: Diagnosis present

## 2023-05-08 DIAGNOSIS — I10 Essential (primary) hypertension: Secondary | ICD-10-CM | POA: Diagnosis not present

## 2023-05-08 DIAGNOSIS — Z7901 Long term (current) use of anticoagulants: Secondary | ICD-10-CM

## 2023-05-08 DIAGNOSIS — M542 Cervicalgia: Secondary | ICD-10-CM | POA: Diagnosis not present

## 2023-05-08 DIAGNOSIS — Z823 Family history of stroke: Secondary | ICD-10-CM

## 2023-05-08 DIAGNOSIS — Z1152 Encounter for screening for COVID-19: Secondary | ICD-10-CM

## 2023-05-08 DIAGNOSIS — I63531 Cerebral infarction due to unspecified occlusion or stenosis of right posterior cerebral artery: Secondary | ICD-10-CM

## 2023-05-08 DIAGNOSIS — M316 Other giant cell arteritis: Principal | ICD-10-CM | POA: Diagnosis present

## 2023-05-08 DIAGNOSIS — N858 Other specified noninflammatory disorders of uterus: Secondary | ICD-10-CM | POA: Diagnosis not present

## 2023-05-08 DIAGNOSIS — H35373 Puckering of macula, bilateral: Secondary | ICD-10-CM | POA: Diagnosis not present

## 2023-05-08 DIAGNOSIS — I6621 Occlusion and stenosis of right posterior cerebral artery: Secondary | ICD-10-CM | POA: Diagnosis not present

## 2023-05-08 DIAGNOSIS — B379 Candidiasis, unspecified: Secondary | ICD-10-CM | POA: Diagnosis not present

## 2023-05-08 DIAGNOSIS — H5460 Unqualified visual loss, one eye, unspecified: Secondary | ICD-10-CM | POA: Diagnosis not present

## 2023-05-08 DIAGNOSIS — K5730 Diverticulosis of large intestine without perforation or abscess without bleeding: Secondary | ICD-10-CM | POA: Diagnosis not present

## 2023-05-08 DIAGNOSIS — G4700 Insomnia, unspecified: Secondary | ICD-10-CM | POA: Diagnosis present

## 2023-05-08 DIAGNOSIS — Z9049 Acquired absence of other specified parts of digestive tract: Secondary | ICD-10-CM | POA: Diagnosis not present

## 2023-05-08 DIAGNOSIS — R1310 Dysphagia, unspecified: Secondary | ICD-10-CM | POA: Diagnosis present

## 2023-05-08 DIAGNOSIS — Z6830 Body mass index (BMI) 30.0-30.9, adult: Secondary | ICD-10-CM

## 2023-05-08 DIAGNOSIS — I82409 Acute embolism and thrombosis of unspecified deep veins of unspecified lower extremity: Secondary | ICD-10-CM | POA: Diagnosis present

## 2023-05-08 DIAGNOSIS — Z91040 Latex allergy status: Secondary | ICD-10-CM

## 2023-05-08 DIAGNOSIS — H353131 Nonexudative age-related macular degeneration, bilateral, early dry stage: Secondary | ICD-10-CM | POA: Diagnosis not present

## 2023-05-08 DIAGNOSIS — R531 Weakness: Secondary | ICD-10-CM | POA: Diagnosis present

## 2023-05-08 DIAGNOSIS — G039 Meningitis, unspecified: Secondary | ICD-10-CM | POA: Diagnosis not present

## 2023-05-08 DIAGNOSIS — I447 Left bundle-branch block, unspecified: Secondary | ICD-10-CM | POA: Diagnosis not present

## 2023-05-08 DIAGNOSIS — R059 Cough, unspecified: Secondary | ICD-10-CM | POA: Diagnosis not present

## 2023-05-08 DIAGNOSIS — Z79890 Hormone replacement therapy (postmenopausal): Secondary | ICD-10-CM

## 2023-05-08 DIAGNOSIS — Z79899 Other long term (current) drug therapy: Secondary | ICD-10-CM

## 2023-05-08 DIAGNOSIS — I2510 Atherosclerotic heart disease of native coronary artery without angina pectoris: Secondary | ICD-10-CM | POA: Diagnosis not present

## 2023-05-08 DIAGNOSIS — H47013 Ischemic optic neuropathy, bilateral: Secondary | ICD-10-CM | POA: Diagnosis not present

## 2023-05-08 DIAGNOSIS — Z8619 Personal history of other infectious and parasitic diseases: Secondary | ICD-10-CM

## 2023-05-08 DIAGNOSIS — Z801 Family history of malignant neoplasm of trachea, bronchus and lung: Secondary | ICD-10-CM

## 2023-05-08 DIAGNOSIS — I77810 Thoracic aortic ectasia: Secondary | ICD-10-CM | POA: Diagnosis not present

## 2023-05-08 DIAGNOSIS — H53132 Sudden visual loss, left eye: Secondary | ICD-10-CM | POA: Diagnosis not present

## 2023-05-08 DIAGNOSIS — Z8249 Family history of ischemic heart disease and other diseases of the circulatory system: Secondary | ICD-10-CM

## 2023-05-08 DIAGNOSIS — H538 Other visual disturbances: Secondary | ICD-10-CM | POA: Diagnosis not present

## 2023-05-08 DIAGNOSIS — H539 Unspecified visual disturbance: Secondary | ICD-10-CM | POA: Diagnosis not present

## 2023-05-08 DIAGNOSIS — Z96653 Presence of artificial knee joint, bilateral: Secondary | ICD-10-CM | POA: Diagnosis present

## 2023-05-08 DIAGNOSIS — Z882 Allergy status to sulfonamides status: Secondary | ICD-10-CM

## 2023-05-08 LAB — COMPREHENSIVE METABOLIC PANEL WITH GFR
ALT: 109 U/L — ABNORMAL HIGH (ref 0–44)
AST: 174 U/L — ABNORMAL HIGH (ref 15–41)
Albumin: 2.1 g/dL — ABNORMAL LOW (ref 3.5–5.0)
Alkaline Phosphatase: 177 U/L — ABNORMAL HIGH (ref 38–126)
Anion gap: 15 (ref 5–15)
BUN: 11 mg/dL (ref 8–23)
CO2: 24 mmol/L (ref 22–32)
Calcium: 8.3 mg/dL — ABNORMAL LOW (ref 8.9–10.3)
Chloride: 97 mmol/L — ABNORMAL LOW (ref 98–111)
Creatinine, Ser: 0.79 mg/dL (ref 0.44–1.00)
GFR, Estimated: >60 mL/min (ref >60)
Glucose, Bld: 120 mg/dL — ABNORMAL HIGH (ref 70–99)
Potassium: 2.9 mmol/L — ABNORMAL LOW (ref 3.5–5.1)
Sodium: 136 mmol/L (ref 135–145)
Total Bilirubin: 1 mg/dL (ref 0.0–1.2)
Total Protein: 6.6 g/dL (ref 6.5–8.1)

## 2023-05-08 LAB — T4, FREE: Free T4: 1.62 ng/dL — ABNORMAL HIGH (ref 0.61–1.12)

## 2023-05-08 LAB — CBC WITH DIFFERENTIAL/PLATELET
Abs Immature Granulocytes: 0.2 10*3/uL — ABNORMAL HIGH (ref 0.00–0.07)
Basophils Absolute: 0.1 10*3/uL (ref 0.0–0.1)
Basophils Relative: 0 %
Eosinophils Absolute: 0 10*3/uL (ref 0.0–0.5)
Eosinophils Relative: 0 %
HCT: 35.7 % — ABNORMAL LOW (ref 36.0–46.0)
Hemoglobin: 11.4 g/dL — ABNORMAL LOW (ref 12.0–15.0)
Immature Granulocytes: 1 %
Lymphocytes Relative: 8 %
Lymphs Abs: 1.3 10*3/uL (ref 0.7–4.0)
MCH: 27.3 pg (ref 26.0–34.0)
MCHC: 31.9 g/dL (ref 30.0–36.0)
MCV: 85.6 fL (ref 80.0–100.0)
Monocytes Absolute: 1.3 10*3/uL — ABNORMAL HIGH (ref 0.1–1.0)
Monocytes Relative: 8 %
Neutro Abs: 12.8 10*3/uL — ABNORMAL HIGH (ref 1.7–7.7)
Neutrophils Relative %: 83 %
Platelets: 452 10*3/uL — ABNORMAL HIGH (ref 150–400)
RBC: 4.17 MIL/uL (ref 3.87–5.11)
RDW: 14.1 % (ref 11.5–15.5)
WBC: 15.7 10*3/uL — ABNORMAL HIGH (ref 4.0–10.5)
nRBC: 0 % (ref 0.0–0.2)

## 2023-05-08 LAB — RESP PANEL BY RT-PCR (RSV, FLU A&B, COVID)  RVPGX2
Influenza A by PCR: NEGATIVE
Influenza B by PCR: NEGATIVE
Resp Syncytial Virus by PCR: NEGATIVE
SARS Coronavirus 2 by RT PCR: NEGATIVE

## 2023-05-08 LAB — LIPID PANEL
Cholesterol: 174 mg/dL (ref 0–200)
HDL: 32 mg/dL — ABNORMAL LOW (ref >40)
LDL Cholesterol: 121 mg/dL — ABNORMAL HIGH (ref 0–99)
Total CHOL/HDL Ratio: 5.4 ratio
Triglycerides: 104 mg/dL (ref <150)
VLDL: 21 mg/dL (ref 0–40)

## 2023-05-08 LAB — TSH: TSH: 6.647 u[IU]/mL — ABNORMAL HIGH (ref 0.350–4.500)

## 2023-05-08 LAB — PHOSPHORUS: Phosphorus: 3.2 mg/dL (ref 2.5–4.6)

## 2023-05-08 LAB — HEMOGLOBIN A1C
Hgb A1c MFr Bld: 6.3 % — ABNORMAL HIGH (ref 4.8–5.6)
Mean Plasma Glucose: 134.11 mg/dL

## 2023-05-08 LAB — HEPATITIS PANEL, ACUTE
HCV Ab: NONREACTIVE
Hep A IgM: NONREACTIVE
Hep B C IgM: NONREACTIVE
Hepatitis B Surface Ag: NONREACTIVE

## 2023-05-08 LAB — TROPONIN I (HIGH SENSITIVITY)
Troponin I (High Sensitivity): 16 ng/L (ref <18)
Troponin I (High Sensitivity): 14 ng/L (ref <18)

## 2023-05-08 LAB — PROTIME-INR
INR: 1.3 — ABNORMAL HIGH (ref 0.8–1.2)
Prothrombin Time: 16.6 s — ABNORMAL HIGH (ref 11.4–15.2)

## 2023-05-08 LAB — ECHOCARDIOGRAM COMPLETE BUBBLE STUDY
AR max vel: 2.31 cm2
AV Peak grad: 5.7 mmHg
Ao pk vel: 1.19 m/s
Area-P 1/2: 3.85 cm2
Est EF: 40
MV M vel: 2.47 m/s
MV Peak grad: 24.4 mmHg
P 1/2 time: 597 ms
S' Lateral: 2.9 cm

## 2023-05-08 LAB — D-DIMER, QUANTITATIVE: D-Dimer, Quant: >20 ug{FEU}/mL — ABNORMAL HIGH (ref 0.00–0.50)

## 2023-05-08 LAB — CK: Total CK: 21 U/L — ABNORMAL LOW (ref 38–234)

## 2023-05-08 LAB — VITAMIN D 25 HYDROXY (VIT D DEFICIENCY, FRACTURES): Vit D, 25-Hydroxy: 58.87 ng/mL (ref 30–100)

## 2023-05-08 LAB — GAMMA GT: GGT: 117 U/L — ABNORMAL HIGH (ref 7–50)

## 2023-05-08 LAB — MAGNESIUM: Magnesium: 2 mg/dL (ref 1.7–2.4)

## 2023-05-08 LAB — SEDIMENTATION RATE: Sed Rate: 108 mm/h — ABNORMAL HIGH (ref 0–22)

## 2023-05-08 LAB — C-REACTIVE PROTEIN: CRP: 28 mg/dL — ABNORMAL HIGH (ref <1.0)

## 2023-05-08 MED ORDER — ASPIRIN 81 MG PO TBEC
81.0000 mg | DELAYED_RELEASE_TABLET | Freq: Every day | ORAL | Status: DC
  Administered 2023-05-08 – 2023-05-12 (×5): 81 mg via ORAL
  Filled 2023-05-08 (×5): qty 1

## 2023-05-08 MED ORDER — SODIUM CHLORIDE 0.9 % IV SOLN
500.0000 mg | INTRAVENOUS | Status: AC
  Administered 2023-05-08 – 2023-05-09 (×2): 500 mg via INTRAVENOUS
  Filled 2023-05-08 (×6): qty 500

## 2023-05-08 MED ORDER — ACETAMINOPHEN 325 MG PO TABS
650.0000 mg | ORAL_TABLET | Freq: Four times a day (QID) | ORAL | Status: DC | PRN
  Filled 2023-05-08: qty 2

## 2023-05-08 MED ORDER — SODIUM CHLORIDE 0.9 % IV SOLN
500.0000 mg | INTRAVENOUS | Status: AC
  Administered 2023-05-08: 500 mg via INTRAVENOUS
  Filled 2023-05-08: qty 500

## 2023-05-08 MED ORDER — POLYETHYLENE GLYCOL 3350 17 G PO PACK
17.0000 g | PACK | Freq: Every day | ORAL | Status: DC | PRN
  Filled 2023-05-08: qty 1

## 2023-05-08 MED ORDER — ACETAMINOPHEN 650 MG RE SUPP: 650.0000 mg | Freq: Four times a day (QID) | RECTAL | Status: DC | PRN

## 2023-05-08 MED ORDER — SODIUM CHLORIDE 0.9% FLUSH
3.0000 mL | Freq: Two times a day (BID) | INTRAVENOUS | Status: DC
  Administered 2023-05-08 – 2023-05-12 (×8): 3 mL via INTRAVENOUS

## 2023-05-08 MED ORDER — PROCHLORPERAZINE EDISYLATE 10 MG/2ML IJ SOLN: 5.0000 mg | Freq: Four times a day (QID) | INTRAMUSCULAR | Status: DC | PRN

## 2023-05-08 MED ORDER — PREDNISONE 20 MG PO TABS
60.0000 mg | ORAL_TABLET | Freq: Every day | ORAL | Status: DC
  Administered 2023-05-11 – 2023-05-12 (×2): 60 mg via ORAL
  Filled 2023-05-08 (×2): qty 3

## 2023-05-08 MED ORDER — SODIUM CHLORIDE 0.9% FLUSH: 3.0000 mL | INTRAVENOUS | Status: DC | PRN

## 2023-05-08 MED ORDER — HEPARIN SODIUM (PORCINE) 5000 UNIT/ML IJ SOLN
5000.0000 [IU] | Freq: Three times a day (TID) | INTRAMUSCULAR | Status: DC
  Administered 2023-05-08 – 2023-05-09 (×3): 5000 [IU] via SUBCUTANEOUS
  Filled 2023-05-08 (×3): qty 1

## 2023-05-08 MED ORDER — POTASSIUM CHLORIDE CRYS ER 20 MEQ PO TBCR
60.0000 meq | EXTENDED_RELEASE_TABLET | Freq: Once | ORAL | Status: AC
  Administered 2023-05-08: 60 meq via ORAL
  Filled 2023-05-08: qty 3

## 2023-05-08 MED ORDER — PREDNISONE 20 MG PO TABS
60.0000 mg | ORAL_TABLET | Freq: Every day | ORAL | Status: DC
Start: 2023-05-11 — End: 2023-05-08

## 2023-05-08 MED ORDER — HYDROCODONE-ACETAMINOPHEN 5-325 MG PO TABS
1.0000 | ORAL_TABLET | Freq: Once | ORAL | Status: AC
  Administered 2023-05-08: 1 via ORAL
  Filled 2023-05-08: qty 1

## 2023-05-08 MED ORDER — SODIUM CHLORIDE 0.9% FLUSH
3.0000 mL | Freq: Two times a day (BID) | INTRAVENOUS | Status: DC
  Administered 2023-05-08 (×2): 10 mL via INTRAVENOUS
  Administered 2023-05-09: 3 mL via INTRAVENOUS
  Administered 2023-05-10: 6 mL via INTRAVENOUS
  Administered 2023-05-10 – 2023-05-11 (×2): 3 mL via INTRAVENOUS
  Administered 2023-05-11: 10 mL via INTRAVENOUS

## 2023-05-08 MED ORDER — MELATONIN 5 MG PO TABS: 5.0000 mg | ORAL_TABLET | Freq: Every evening | ORAL | Status: DC | PRN

## 2023-05-08 NOTE — Progress Notes (Signed)
 Echocardiogram 2D Echocardiogram has been performed.  [name] 05/08/2023, 4:13 PM

## 2023-05-08 NOTE — ED Notes (Signed)
 Called lab who reported they would add the new lab test orders to blood in the lab and the only thing I need is 2 sst which phleb will get

## 2023-05-08 NOTE — ED Triage Notes (Signed)
 Pt referred to ED from eye doctor. Per pt she was sick approximately two weeks ago and then began having vision changes L>R. Pt seen today and diagnosed with temporal arteritis. Pt also c/o mild headache.

## 2023-05-08 NOTE — ED Notes (Signed)
 Called lab bc additional labs have not been process.  Lab reported they are very behind but will get it done.

## 2023-05-08 NOTE — Plan of Care (Signed)
   Problem: Clinical Measurements: Goal: Ability to maintain clinical measurements within normal limits will improve Outcome: Progressing Goal: Will remain free from infection Outcome: Progressing   Problem: Coping: Goal: Level of anxiety will decrease Outcome: Progressing

## 2023-05-08 NOTE — Consult Note (Signed)
 [facility] [place]  ASSESSMENT / PLAN: 89 y.o. female with left eye visual loss and pain with swallowing. Elevated CRP / ESR. Clinical concern for temporal arteritis. Discussed with patient and daughter. They would like to proceed with temporal artery biopsy. This will likely be with Dr. Fulton Job. Keep NPO at midnight. Orders for consent written.   CHIEF COMPLAINT: left eye visual loss  HISTORY OF PRESENT ILLNESS: [name] is a 89 y.o. female referred to [facility] emergency department for evaluation of possible temporal arteritis.  The patient was evaluated by her eye doctor earlier today and was found to have left eye visual loss.  She reports a history of light sensitivity over the past several weeks.  She has had some pain with swallowing.  She is otherwise a fairly healthy elderly woman.   Past Medical History:  Diagnosis Date   Acute blood loss anemia    Arthritis    Benign essential HTN    pt denies    Constipation    GERD (gastroesophageal reflux disease)    infrequently - otc med as needed   Heart murmur    "leacky heart valve"agrivated by caffiene / activity - no heart studies have been done per pt   Hyponatremia    Hypothyroidism    Leukocytosis    MVP (mitral valve prolapse)    Primary osteoarthritis of right knee    Rosacea    Shingles 10/2017   Unsteady gait     Past Surgical History:  Procedure Laterality Date   BUNIONECTOMY     CHOLECYSTECTOMY     COLONOSCOPY     2004 & 06/17/12   FINGER SURGERY     rt thumb   TOTAL KNEE ARTHROPLASTY Right 02/02/2015   Procedure: TOTAL RIGHT KNEE ARTHROPLASTY;  Surgeon: Orvan Blanch, MD;  Location: [facility];  Service: Orthopedics;  Laterality: Right;   TOTAL KNEE ARTHROPLASTY Left 09/21/2015   Procedure: LEFT TOTAL KNEE ARTHROPLASTY;  Surgeon: Orvan Blanch, MD;  Location: [facility];  Service: Orthopedics;  Laterality: Left;   TUBAL LIGATION      Family History  Problem Relation Age of Onset    Stroke Mother        ?   Congestive Heart Failure Father    Rheum arthritis Sister    Lung cancer Brother    Prostate cancer Brother    Aneurysm Brother        ?   Lung cancer Brother    Other Brother        open heart surgery   Breast cancer Sister    Dementia Sister    Fibromyalgia Sister     Social History   Socioeconomic History   Marital status: Widowed    Spouse name: Not on file   Number of children: 2   Years of education: Not on file   Highest education level: High school graduate  Occupational History   Not on file  Tobacco Use   Smoking status: Never   Smokeless tobacco: Never  Vaping Use   Vaping status: Never Used  Substance and Sexual Activity   Alcohol  use: No   Drug use: No   Sexual activity: Not on file  Other Topics Concern   Not on file  Social History Narrative   Lives at home alone   Right handed   Social Drivers of Health   Financial Resource Strain: Not on file  Food Insecurity: Not on file  Transportation Needs: Not on  file  Physical Activity: Not on file  Stress: No Stress Concern Present (04/16/2022)   Received from [facility] - Occupational Stress Questionnaire    Feeling of Stress : Only a little  Social Connections: Unknown (03/26/2022)   [facility] from [facility]   Social Network    Social Network: Not on file  Intimate Partner Violence: Not At Risk (04/16/2022)   Received from [facility]   HITS    Over the last 12 months how often did your partner physically hurt you?: Never    Over the last 12 months how often did your partner insult you or talk down to you?: Never    Over the last 12 months how often did your partner threaten you with physical harm?: Never    Over the last 12 months how often did your partner scream or curse at you?: Never    Allergies  Allergen Reactions   Ampicillin Cough    Has patient had a PCN reaction causing immediate rash, facial/tongue/throat  swelling, SOB or lightheadedness with hypotension: No Has patient had a PCN reaction causing severe rash involving mucus membranes or skin necrosis: No Has patient had a PCN reaction that required hospitalization No Has patient had a PCN reaction occurring within the last 10 years: No If all of the above answers are "NO", then may proceed with Cephalosporin use.   Elastic Bandages & [Zinc]    Sulfa Antibiotics Cough   Latex Rash    Current Facility-Administered Medications  Medication Dose Route Frequency Provider Last Rate Last Admin   acetaminophen  (TYLENOL ) tablet 650 mg  650 mg Oral Q6H PRN Patel, Ekta V, MD       Or   acetaminophen  (TYLENOL ) suppository 650 mg  650 mg Rectal Q6H PRN Patel, Ekta V, MD       aspirin  EC tablet 81 mg  81 mg Oral Daily Patel, Ekta V, MD   81 mg at 05/08/23 1623   heparin  injection 5,000 Units  5,000 Units Subcutaneous Q8H Brunilda Capra V, MD   5,000 Units at 05/08/23 1623   [START ON 05/09/2023] methylPREDNISolone  sodium succinate (SOLU-MEDROL ) 500 mg in sodium chloride  0.9 % 50 mL IVPB  500 mg Intravenous Q24H Patel, Ekta V, MD       [START ON 05/11/2023] predniSONE  (DELTASONE ) tablet 60 mg  60 mg Oral Q breakfast Lavanda Porter, MD       sodium chloride  flush (NS) 0.9 % injection 3 mL  3 mL Intravenous Q12H Brunilda Capra V, MD   3 mL at 05/08/23 1623   sodium chloride  flush (NS) 0.9 % injection 3-10 mL  3-10 mL Intravenous Q12H Brunilda Capra V, MD   10 mL at 05/08/23 1624   sodium chloride  flush (NS) 0.9 % injection 3-10 mL  3-10 mL Intravenous PRN Lavanda Porter, MD        PHYSICAL EXAM Vitals:   05/08/23 1400 05/08/23 1500 05/08/23 1600 05/08/23 1604  BP: (!) 133/54 (!) 149/55 (!) 141/51   Pulse: 77 74 75   Resp: 15 19 17    Temp:    97.8 F (36.6 C)  TempSrc:    Oral  SpO2: 95% 94% 93%   Weight:      Height:       Elderly woman in no distress Regular rate and rhythm Unlabored breathing I am unable to palpate a temporal artery pulse.  She has no  tenderness over the temporal artery.  Palpable radial pulses bilaterally  PERTINENT LABORATORY AND RADIOLOGIC DATA  Most recent CBC    Latest Ref Rng & Units 05/08/2023   11:35 AM 09/23/2015    4:13 AM 09/22/2015    4:23 AM  CBC  WBC 4.0 - 10.5 K/uL 15.7  8.7  14.3   Hemoglobin 12.0 - 15.0 g/dL 40.9  81.1  91.4   Hematocrit 36.0 - 46.0 % 35.7  37.2  40.1   Platelets 150 - 400 K/uL 452  203  240      Most recent CMP    Latest Ref Rng & Units 05/08/2023   11:35 AM 09/22/2015    4:23 AM 09/15/2015    3:00 PM  CMP  Glucose 70 - 99 mg/dL 782  956  213   BUN 8 - 23 mg/dL 11  8  16    Creatinine 0.44 - 1.00 mg/dL 0.86  5.78  4.69   Sodium 135 - 145 mmol/L 136  136  140   Potassium 3.5 - 5.1 mmol/L 2.9  4.0  4.5   Chloride 98 - 111 mmol/L 97  103  105   CO2 22 - 32 mmol/L 24  26  30    Calcium 8.9 - 10.3 mg/dL 8.3  8.8  9.0   Total Protein 6.5 - 8.1 g/dL 6.6     Total Bilirubin 0.0 - 1.2 mg/dL 1.0     Alkaline Phos 38 - 126 U/L 177     AST 15 - 41 U/L 174     ALT 0 - 44 U/L 109      Omero Kowal N. Edgardo Goodwill, MD [facility] Phone Number: [phone] 05/08/2023 5:32 PM   Total time spent on preparing this encounter including chart review, data review, collecting history, examining the patient, and coordinating care: 60 minutes.   Portions of this report may have been transcribed using voice recognition software.  Every effort has been made to ensure accuracy; however, inadvertent computerized transcription errors may still be present.

## 2023-05-08 NOTE — ED Provider Notes (Signed)
 [place] EMERGENCY DEPARTMENT AT [facility] Provider Note   CSN: [id number] Arrival date & time: 05/08/23  1050     History  Chief Complaint  Patient presents with   Eye Problem    [name] is a 89 y.o. female.  89 year old female with a history of hypertension and hypothyroidism who presents emergency department with headache, fevers, generalized weakness, and vision loss.  Since April 16 has been feeling sick.  Has been having a headache.  Has also been having off-and-on fevers with a Tmax of one 1.6.  Last fever was several days ago.  Has been characterizing the headache is occipital but occasionally will go to her left eye.  Has also noticed that her vision is becoming dim out of her left eye.  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  Also has been complaining of some jaw pain with chewing.  Has had some muscle aches as well.       Home Medications Prior to Admission medications   Medication Sig Start Date End Date Taking? Authorizing Provider  acetaminophen  (TYLENOL ) 500 MG tablet Take 500 mg by mouth every 6 (six) hours as needed for fever, moderate pain (pain score 4-6) or mild pain (pain score 1-3).   Yes [provider]  Biotin 1000 MCG tablet Take 1,000 mcg by mouth at bedtime.   Yes [provider]  CALCIUM MAGNESIUM  ZINC PO Take 1 tablet by mouth at bedtime.   Yes [provider]  Cholecalciferol  (VITAMIN D -3 PO) Take 1 tablet by mouth at bedtime.   Yes [provider]  Homeopathic Products (CALENDULA EX) Apply 1 Application topically as needed.   Yes [provider]  levothyroxine  (SYNTHROID , LEVOTHROID) 88 MCG tablet Take 88 mcg by mouth See admin instructions. Take 1 tablet by mouth in the morning and on Sunday take only 1/2 tablet.   Yes [provider]  MAGNESIUM  PO Take 1 tablet by mouth at bedtime.    Yes [provider]  Polyethyl Glycol-Propyl Glycol (SYSTANE OP) Apply 1 drop to eye as needed. Both eyes   Yes [provider]      Allergies    Ampicillin, Elastic bandages & [zinc], Sulfa antibiotics, and Latex    Review of Systems   Review of Systems  Physical Exam Updated Vital Signs BP (!) 149/71 (BP Location: Right Arm)   Pulse 77   Temp (!) 97.3 F (36.3 C) (Oral)   Resp 12   Ht 4\' 9"  (1.448 m)   Wt 63 kg   SpO2 95%   BMI 30.08 kg/m  Physical Exam Vitals and nursing note reviewed.  Constitutional:      General: She is not in acute distress.    Appearance: She is well-developed.  HENT:     Head: Normocephalic and atraumatic.     Right Ear: External ear normal.     Left Ear: External ear normal.     Nose: Nose normal.  Eyes:     Extraocular Movements: Extraocular movements intact.     Conjunctiva/sclera: Conjunctivae normal.     Pupils: Pupils are equal, round, and reactive to light.     Comments: Pupils 6 mm and minimally reactive bilaterally.  Says visual acuity is normal out of left eye and can at least count fingers.  No vision out of the left eye.  Neck:     Comments: No meningismus Cardiovascular:  Rate and Rhythm: Normal rate and regular rhythm.     Heart sounds: No murmur heard. Pulmonary:     Effort: Pulmonary effort is normal. No respiratory distress.     Breath sounds: Normal breath sounds.  Musculoskeletal:     Cervical back: Normal range of motion and neck supple.     Right lower leg: No edema.     Left lower leg: No edema.  Skin:    General: Skin is warm and dry.  Neurological:     Mental Status: She is alert and oriented to person, place, and time. Mental status is at baseline.     Cranial Nerves: No cranial nerve deficit.     Sensory: No sensory deficit.     Motor: No weakness.  Psychiatric:        Mood and Affect: Mood normal.     ED Results / Procedures / Treatments   Labs (all labs ordered are listed, but  only abnormal results are displayed) Labs Reviewed  SEDIMENTATION RATE - Abnormal; Notable for the following components:      Result Value   Sed Rate 108 (*)    All other components within normal limits  C-REACTIVE PROTEIN - Abnormal; Notable for the following components:   CRP 28.0 (*)    All other components within normal limits  CBC WITH DIFFERENTIAL/PLATELET - Abnormal; Notable for the following components:   WBC 15.7 (*)    Hemoglobin 11.4 (*)    HCT 35.7 (*)    Platelets 452 (*)    Neutro Abs 12.8 (*)    Monocytes Absolute 1.3 (*)    Abs Immature Granulocytes 0.20 (*)    All other components within normal limits  COMPREHENSIVE METABOLIC PANEL WITH GFR - Abnormal; Notable for the following components:   Potassium 2.9 (*)    Chloride 97 (*)    Glucose, Bld 120 (*)    Calcium 8.3 (*)    Albumin 2.1 (*)    AST 174 (*)    ALT 109 (*)    Alkaline Phosphatase 177 (*)    All other components within normal limits  CK - Abnormal; Notable for the following components:   Total CK 21 (*)    All other components within normal limits  GAMMA GT - Abnormal; Notable for the following components:   GGT 117 (*)    All other components within normal limits  HEMOGLOBIN A1C - Abnormal; Notable for the following components:   Hgb A1c MFr Bld 6.3 (*)    All other components within normal limits  D-DIMER, QUANTITATIVE - Abnormal; Notable for the following components:   D-Dimer, Quant >20.00 (*)    All other components within normal limits  PROTIME-INR - Abnormal; Notable for the following components:   Prothrombin Time 16.6 (*)    INR 1.3 (*)    All other components within normal limits  T4, FREE - Abnormal; Notable for the following components:   Free T4 1.62 (*)    All other components within normal limits  TSH - Abnormal; Notable for the following components:   TSH 6.647 (*)    All other components within normal limits  LIPID PANEL - Abnormal; Notable for the following components:    HDL 32 (*)    LDL Cholesterol 121 (*)    All other components within normal limits  RESP PANEL BY RT-PCR (RSV, FLU A&B, COVID)  RVPGX2  MAGNESIUM   HEPATITIS PANEL, ACUTE  PHOSPHORUS  VITAMIN D  25 HYDROXY (VIT D  DEFICIENCY, FRACTURES)  ANA W/REFLEX IF POSITIVE  PROTEIN ELECTROPHORESIS, SERUM  RHEUMATOID FACTOR  COMPREHENSIVE METABOLIC PANEL WITH GFR  CBC  TROPONIN I (HIGH SENSITIVITY)  TROPONIN I (HIGH SENSITIVITY)    EKG EKG Interpretation Date/Time:  Thursday May 08 2023 11:01:46 EDT Ventricular Rate:  94 PR Interval:  152 QRS Duration:  139 QT Interval:  393 QTC Calculation: 492 R Axis:   -7  Text Interpretation: Sinus rhythm Probable left atrial enlargement Left bundle branch block Confirmed by Shyrl Doyne [phone]) on 05/08/2023 11:47:59 AM  Radiology ECHOCARDIOGRAM COMPLETE BUBBLE STUDY Result Date: 05/08/2023    ECHOCARDIOGRAM REPORT   Patient Name:   [name] Date of Exam: 05/08/2023 Medical Rec #:  [id number]          Height:       57.0 in Accession #:    [id number]         Weight:       139.0 lb Date of Birth:  1933-01-14          BSA:          1.541 m Patient Age:    89 years           BP:           133/54 mmHg Patient Gender: F                  HR:           76 bpm. Exam Location:  Inpatient Procedure: 2D Echo, Cardiac Doppler, Color Doppler and Saline Contrast Bubble            Study (Both Spectral and Color Flow Doppler were utilized during            procedure). Indications:    Stroke 434.91 / I63.9  History:        Patient has no prior history of Echocardiogram examinations.                 Risk Factors:Dyslipidemia.  Sonographer:    Terrilee Few RCS Referring Phys: [phone] EKTA V PATEL IMPRESSIONS  1. Normal left ventricular size. Left ventricular ejection fraction, by estimation, is 40%. The left ventricle has mild to moderately decreased function. The left ventricle demonstrates global hypokinesis worse in the septum, septal-lateral dyssynchrony  consistent  with LBBB. Left ventricular diastolic parameters are consistent with Grade I diastolic dysfunction (impaired relaxation).  2. Right ventricular systolic function is normal. The right ventricular size is normal. There is normal pulmonary artery systolic pressure. The estimated right ventricular systolic pressure is 16.0 mmHg.  3. Bubble study negative, no PFO or ASD.  4. The mitral valve is degenerative. Mild mitral valve regurgitation. No evidence of mitral stenosis. Moderate mitral annular calcification.  5. The aortic valve is tricuspid. Aortic valve regurgitation is mild. No aortic stenosis is present.  6. The inferior vena cava is normal in size with greater than 50% respiratory variability, suggesting right atrial pressure of 3 mmHg. FINDINGS  Left Ventricle: Left ventricular ejection fraction, by estimation, is 40%. The left ventricle has mild to moderately decreased function. The left ventricle demonstrates global hypokinesis. The left ventricular internal cavity size was normal in size. There is no left ventricular hypertrophy. Left ventricular diastolic parameters are consistent with Grade I diastolic dysfunction (impaired relaxation). Right Ventricle: The right ventricular size is normal. No increase in right ventricular wall thickness. Right ventricular systolic function is normal. There is normal pulmonary artery systolic pressure. The tricuspid regurgitant velocity  is 1.80 m/s, and  with an assumed right atrial pressure of 3 mmHg, the estimated right ventricular systolic pressure is 16.0 mmHg. Left Atrium: Left atrial size was normal in size. Right Atrium: Right atrial size was normal in size. Pericardium: There is no evidence of pericardial effusion. Mitral Valve: The mitral valve is degenerative in appearance. There is mild calcification of the mitral valve leaflet(s). Moderate mitral annular calcification. Mild mitral valve regurgitation. No evidence of mitral valve stenosis. Tricuspid Valve: The  tricuspid valve is normal in structure. Tricuspid valve regurgitation is trivial. Aortic Valve: The aortic valve is tricuspid. Aortic valve regurgitation is mild. Aortic regurgitation PHT measures 597 msec. No aortic stenosis is present. Aortic valve peak gradient measures 5.7 mmHg. Pulmonic Valve: The pulmonic valve was normal in structure. Pulmonic valve regurgitation is not visualized. Aorta: The aortic root is normal in size and structure. Venous: The inferior vena cava is normal in size with greater than 50% respiratory variability, suggesting right atrial pressure of 3 mmHg. IAS/Shunts: Bubble study negative, no PFO or ASD. Agitated saline contrast was given intravenously to evaluate for intracardiac shunting.  LEFT VENTRICLE PLAX 2D LVIDd:         4.10 cm   Diastology LVIDs:         2.90 cm   LV e' medial:    6.20 cm/s LV PW:         1.00 cm   LV E/e' medial:  14.2 LV IVS:        0.60 cm   LV e' lateral:   5.22 cm/s LVOT diam:     2.00 cm   LV E/e' lateral: 16.9 LV SV:         52 LV SV Index:   34 LVOT Area:     3.14 cm  RIGHT VENTRICLE             IVC RV S prime:     10.40 cm/s  IVC diam: 1.40 cm TAPSE (M-mode): 2.0 cm LEFT ATRIUM           Index        RIGHT ATRIUM           Index LA diam:      3.00 cm 1.95 cm/m   RA Area:     10.20 cm LA Vol (A2C): 37.8 ml 24.53 ml/m  RA Volume:   20.40 ml  13.24 ml/m LA Vol (A4C): 24.7 ml 16.03 ml/m  AORTIC VALVE AV Area (Vmax): 2.31 cm AV Vmax:        119.00 cm/s AV Peak Grad:   5.7 mmHg LVOT Vmax:      87.60 cm/s LVOT Vmean:     55.400 cm/s LVOT VTI:       0.165 m AI PHT:         597 msec  AORTA Ao Root diam: 2.50 cm Ao Asc diam:  3.30 cm MITRAL VALVE                TRICUSPID VALVE MV Area (PHT): 3.85 cm     TR Peak grad:   13.0 mmHg MV Decel Time: 197 msec     TR Vmax:        180.00 cm/s MR Peak grad: 24.4 mmHg MR Vmax:      247.00 cm/s   SHUNTS MV E velocity: 88.00 cm/s   Systemic VTI:  0.16 m MV A velocity: 141.00 cm/s  Systemic Diam: 2.00 cm MV E/A ratio:   0.62  [name] Electronically signed by Archer Bear Signature Date/Time: 05/08/2023/4:28:30 PM    Final    DG Chest 2 View Result Date: 05/08/2023 CLINICAL DATA:  Cough, visual changes, temporal arteritis EXAM: CHEST - 2 VIEW COMPARISON:  None Available. FINDINGS: Frontal and lateral views of the chest demonstrate an enlarged cardiac silhouette. There is ectasia and atherosclerosis of the thoracic aorta. No acute airspace disease, effusion, or pneumothorax. No acute bony abnormalities. IMPRESSION: 1. Enlarged cardiac silhouette. 2. No acute airspace disease. 3. Atherosclerosis and ectasia of the thoracic aorta. Electronically Signed   By: Bobbye Burrow M.D.   On: 05/08/2023 14:48   MR ANGIO HEAD WO W CONTRAST Result Date: 05/08/2023 CLINICAL DATA:  89 year old female with headache and vision changes. Possible temporal arteritis. EXAM: MRA HEAD WITHOUT AND WITH CONTRAST TECHNIQUE: Multiplanar, multi-echo pulse sequences of the brain and surrounding structures were acquired without intravenous contrast. Angiographic images of the Circle of Willis were acquired using MRA technique without intravenous contrast. COMPARISON:  Brain MRI 08/07/2017. FINDINGS: Anterior circulation: Antegrade flow in both ICA siphons. No siphon stenosis. Patent ophthalmic artery origins. Patent carotid termini. Patent MCA and ACA origins. Non dominant right A1. Anterior communicating artery and visible bilateral ACA branches are within normal limits. Left MCA M1 segment, bifurcation, left MCA branches are within normal limits. Right MCA M1 segment, trifurcation, visible right MCA branches are within normal limits. Posterior circulation: Pre contrast images demonstrate antegrade flow signal in codominant distal vertebral arteries, vertebrobasilar junction, basilar artery. SCA, AICA, left PICA origins appear normal. Left PCA is within normal limits. Right PCA is occluded at the P1, P2 junction (series 2, image 85). Posterior  communicating arteries are diminutive or absent. Anatomic variants: Dominant left ACA A1 segment. Other findings: No evidence of intracranial mass effect or ventriculomegaly. No encephalomalacia evident in the right PCA territory. Following contrast the visible major dural venous sinuses also appear to be patent. Bilateral temporal artery region neurovascular bundles appear symmetric. No abnormal intracranial enhancement identified. IMPRESSION: 1. 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 This is age indeterminate. No obvious cerebral edema or encephalomalacia in that vascular territory. 2. Otherwise negative for age intracranial MRA. 3. Temporal artery biopsy (TAB) is the gold standard in diagnosing giant cell arteritis. Electronically Signed   By: Marlise Simpers M.D.   On: 05/08/2023 12:52    Procedures Procedures    Medications Ordered in ED Medications  methylPREDNISolone  sodium succinate (SOLU-MEDROL ) 500 mg in sodium chloride  0.9 % 50 mL IVPB (has no administration in time range)  heparin  injection 5,000 Units (5,000 Units Subcutaneous Given 05/08/23 1623)  sodium chloride  flush (NS) 0.9 % injection 3 mL (3 mLs Intravenous Given 05/08/23 1623)  acetaminophen  (TYLENOL ) tablet 650 mg (has no administration in time range)    Or  acetaminophen  (TYLENOL ) suppository 650 mg (has no administration in time range)  sodium chloride  flush (NS) 0.9 % injection 3-10 mL (10 mLs Intravenous Given 05/08/23 1624)  sodium chloride  flush (NS) 0.9 % injection 3-10 mL (has no administration in time range)  aspirin  EC tablet 81 mg (81 mg Oral Given 05/08/23 1623)  predniSONE  (DELTASONE ) tablet 60 mg (has no administration in time range)  methylPREDNISolone  sodium succinate (SOLU-MEDROL ) 500 mg in sodium chloride  0.9 % 50 mL IVPB (0 mg Intravenous Stopped 05/08/23 1329)  HYDROcodone -acetaminophen  (NORCO/VICODIN) 5-325 MG per tablet 1 tablet (1 tablet Oral Given 05/08/23 1244)  potassium chloride  SA (KLOR-CON  M) CR  tablet 60 mEq (60 mEq Oral Given 05/08/23 1243)  ED Course/ Medical Decision Making/ A&P Clinical Course as of 05/08/23 2045  Thu May 08, 2023  1312 Dr Renaee Caro from neurology consulted. Will follow along.  [RP]  1321 Dr Lydia Sams from hospitalist to admit.  [RP]    Clinical Course User Index [RP] Ninetta Basket, MD                                 Medical Decision Making Amount and/or Complexity of Data Reviewed Labs: ordered. Radiology: ordered.  Risk Prescription drug management. Decision regarding hospitalization.   TAILA MCALHANY is a 89 y.o. female with comorbidities that complicate the patient evaluation including hypertension and hypothyroidism who presents emergency department with headache, fevers, generalized weakness, and vision loss.    Initial Ddx:  Temporal arteritis, stroke, polymyalgia rheumatica, URI, pneumonia  MDM/Course:  Patient presents emergency department with headache and generalized fatigue and intermittent fevers for the past 2 weeks.  Also appears to be having muscle aches and possibly some jaw claudication.  Headache appears to be left-sided and temporal.  Also having vision loss out of that eye.  Was evaluated by ophthalmology today and had a dilated exam which showed that she may have ischemic optic neuropathy concerning for possible temporal arteritis.  Patient had labs that were drawn including inflammatory markers that were elevated.  Had an MRA that showed possible right sided PCA occlusion which could explain her symptoms.  Symptoms have been going on for 2 weeks so no indication for code stroke activation.  Discussed with neurology recommended also sending her back for an MRI of the brain without contrast.  Per MRI techs this will need to be delayed to allow the contrast to washout.  Paged vascular surgery several times to discuss the logistics of temporal artery biopsy with them but unfortunately was unable to reach them.  Admitted to hospitalist  for further management.  Patient started on high-dose steroids.  Of note, patient does have mildly elevated LFTs but is not complaining of any right upper quadrant pain.  I went and reexamined the patient she did not have any pain in her right upper quadrant and has a negative Murphy sign.  This patient presents to the ED for concern of complaints listed in HPI, this involves an extensive number of treatment options, and is a complaint that carries with it a high risk of complications and morbidity. Disposition including potential need for admission considered.   Dispo: Admit to Floor  Additional history obtained from daughter Records reviewed Outpatient Clinic Notes The following labs were independently interpreted: Chemistry and show  hypokalemia with mildly elevated LFTs I personally reviewed and interpreted cardiac monitoring: normal sinus rhythm  I personally reviewed and interpreted the pt's EKG: see above for interpretation  I have reviewed the patients home medications and made adjustments as needed Consults: Hospitalist and Neurology Social Determinants of health:  Geriatric  Portions of this note were generated with Scientist, clinical (histocompatibility and immunogenetics). Dictation errors may occur despite best attempts at proofreading.    CRITICAL CARE Performed by: Ninetta Basket   Total critical care time: 30 minutes  Critical care time was exclusive of separately billable procedures and treating other patients.  Critical care was necessary to treat or prevent imminent or life-threatening deterioration.  Critical care was time spent personally by me on the following activities: development of treatment plan with patient and/or surrogate as well as nursing, discussions with consultants, evaluation of  patient's response to treatment, examination of patient, obtaining history from patient or surrogate, ordering and performing treatments and interventions, ordering and review of laboratory studies,  ordering and review of radiographic studies, pulse oximetry and re-evaluation of patient's condition.   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

## 2023-05-08 NOTE — Consult Note (Signed)
 NEUROLOGY CONSULT NOTE   Date of service: May 08, 2023 Patient Name: [name] MRN:  [id number] DOB:  Apr 29, 1933 Chief Complaint: "vision loss " Requesting Provider: Ninetta Basket, MD  History of Present Illness  [name] is a 89 y.o. female with hx of hypertension and hypothyroidism who presents with vision loss.  Daughter is bedside and contributive to history. She was concerned and took mom to Ophthalmology for vision loss. While there, they noted inability to read chart today from either eye and bilateral temporal artery tenderness, with severe aching pain in head per documentation. Dr. Seward Dao was concerned for anterior ischemic optic neuropathy, temporal arteritis and instructed patient to get further evaluation at the emergency department.   Upon my assessment, patient denies current fevers, chills, nausea, emesis or headache. Vision loss persists with inability to see from her left visual field. Reports lack of appetite.   Over the last 2 weeks patient complained about intermittent fevers, chills,and painful swallowing. She was evaluated by PCP several times, started on a short term course of nystatin for thrush and amoxcillin when symptoms persisted. She was recently referred to ENT; her endoscopy was negative and they recommended following up with upcoming neck imaging due to "hard glands in her throat".  Lack of improvement persisted and was accompanied by by intermittent light sensitivity, which worsened to left sided vision loss a couple days ago.   Resulted ED labs: CRP elevated 28.0, ESR elevated 108 , K 2.9, leukocytosis 15.7, H/H 11.4/35.7,Platelets 452, AST/ALT 174/109, ALP 177  LKW: Unknown   ROS  Comprehensive ROS performed and pertinent positives documented in HPI   Past History   Past Medical History:  Diagnosis Date   Acute blood loss anemia    Arthritis    Benign essential HTN    pt denies    Constipation    GERD (gastroesophageal reflux  disease)    infrequently - otc med as needed   Heart murmur    "leacky heart valve"agrivated by caffiene / activity - no heart studies have been done per pt   Hyponatremia    Hypothyroidism    Leukocytosis    MVP (mitral valve prolapse)    Primary osteoarthritis of right knee    Rosacea    Shingles 10/2017   Unsteady gait     Past Surgical History:  Procedure Laterality Date   BUNIONECTOMY     CHOLECYSTECTOMY     COLONOSCOPY     2004 & 06/17/12   FINGER SURGERY     rt thumb   TOTAL KNEE ARTHROPLASTY Right 02/02/2015   Procedure: TOTAL RIGHT KNEE ARTHROPLASTY;  Surgeon: Orvan Blanch, MD;  Location: [facility];  Service: Orthopedics;  Laterality: Right;   TOTAL KNEE ARTHROPLASTY Left 09/21/2015   Procedure: LEFT TOTAL KNEE ARTHROPLASTY;  Surgeon: Orvan Blanch, MD;  Location: [facility];  Service: Orthopedics;  Laterality: Left;   TUBAL LIGATION      Family History: Family History  Problem Relation Age of Onset   Stroke Mother        ?   Congestive Heart Failure Father    Rheum arthritis Sister    Lung cancer Brother    Prostate cancer Brother    Aneurysm Brother        ?   Lung cancer Brother    Other Brother        open heart surgery   Breast cancer Sister    Dementia Sister    Fibromyalgia Sister  Social History  reports that she has never smoked. She has never used smokeless tobacco. She reports that she does not drink alcohol  and does not use drugs.  Allergies  Allergen Reactions   Ampicillin Cough    Has patient had a PCN reaction causing immediate rash, facial/tongue/throat swelling, SOB or lightheadedness with hypotension: No Has patient had a PCN reaction causing severe rash involving mucus membranes or skin necrosis: No Has patient had a PCN reaction that required hospitalization No Has patient had a PCN reaction occurring within the last 10 years: No If all of the above answers are "NO", then may proceed with Cephalosporin use.   Elastic Bandages & [Zinc]     Sulfa Antibiotics Cough   Latex Rash    Medications  No current facility-administered medications for this encounter.  Current Outpatient Medications:    Biotin 1000 MCG tablet, Take 1,000 mcg by mouth daily., Disp: , Rfl:    Calcium Carbonate-Vitamin D  600-200 MG-UNIT TABS, Take 1 tablet by mouth daily., Disp: , Rfl:    Cholecalciferol  (VITAMIN D  PO), Take 1,000-2,000 Units by mouth daily., Disp: , Rfl:    gabapentin (NEURONTIN) 300 MG capsule, Take 300-600 mg by mouth at bedtime., Disp: , Rfl:    Homeopathic Products (CALENDULA EX), Apply topically., Disp: , Rfl:    levothyroxine  (SYNTHROID , LEVOTHROID) 88 MCG tablet, Take 88 mcg by mouth. Take 1 tablet daily and on Sunday take only 1/2 tablet., Disp: , Rfl:    lidocaine  (LIDODERM ) 5 %, Place 1-2 patches onto the skin daily. Remove & Discard patch within 12 hours or as directed by MD, Disp: , Rfl:    MAGNESIUM  PO, Take 1 tablet by mouth daily. , Disp: , Rfl:    Polyethyl Glycol-Propyl Glycol (SYSTANE OP), Apply 1 drop to eye 2 (two) times daily. Both eyes, Disp: , Rfl:    Pyridoxine  HCl (VITAMIN B-6 PO), Take 1 tablet by mouth daily., Disp: , Rfl:    traMADol (ULTRAM) 50 MG tablet, Take 50 mg by mouth 3 (three) times daily as needed., Disp: , Rfl:    vitamin C  (ASCORBIC ACID ) 500 MG tablet, Take 500 mg by mouth., Disp: , Rfl:   Vitals   Vitals:   05/08/23 1105 05/08/23 1105 05/08/23 1130 05/08/23 1145  BP:   (!) 126/52 (!) 143/56  Pulse:   86 78  Resp:   (!) 22 15  Temp: 98.3 F (36.8 C)     TempSrc: Oral     SpO2:   99% 98%  Weight:  63 kg    Height:  4\' 9"  (1.448 m)      Body mass index is 30.08 kg/m.  Physical Exam   Constitutional: Appears well-developed and well-nourished.  Psych: Affect appropriate to situation.  Eyes: Pupils are round, pharmacologically dilated. No scleral icterus   HENT: No OP obstruction.  Head: Normocephalic. Non-traumatic. No tenderness to palpation at temporal region or throughout scalp.   Cardiovascular: Normal rate and regular rhythm.  Respiratory: Effort normal, non-labored breathing.  GI: Soft.  No distension. There is no tenderness.  Skin: WDI.   Neurologic Examination   Mental Status -  Level of arousal and orientation to time, place, and person were intact. Language including expression, naming, comprehension was assessed and found intact. Attention span and concentration were normal. Recent and remote memory were intact. Fund of Knowledge was assessed and was intact.   Cranial Nerves II - XII - II -  Complete loss of vision in left eye  except to gross movement. Unable to count fingers OS. Further information is documented in her Ophthalmology note from today III, IV, VI - Extraocular movements intact. V - Facial sensation intact bilaterally. VII - Facial movement intact bilaterally. VIII - Hearing intact to voice. X - Palate elevates symmetrically. XI - Chin turning & shoulder shrug intact bilaterally. XII - Tongue protrusion intact.   Motor Strength - The patient's strength was normal in all extremities and pronator drift was absent.  Bulk was normal and fasciculations were absent.   Motor Tone - Muscle tone was assessed at the neck and appendages and was normal. Sensory - Light touch were assessed and were symmetrical.   Coordination - The patient had normal movements in the hands and feet with no ataxia or dysmetria.  Tremor was absent. Gait and Station - deferred.  Labs/Imaging/Neurodiagnostic studies   CBC:  Recent Labs  Lab 05/21/2023 1135  WBC 15.7*  NEUTROABS 12.8*  HGB 11.4*  HCT 35.7*  MCV 85.6  PLT 452*   Basic Metabolic Panel:  Lab Results  Component Value Date   NA 136 May 21, 2023   K 2.9 (L) 05/21/23   CO2 24 21-May-2023   GLUCOSE 120 (H) 05/21/2023   BUN 11 2023-05-21   CREATININE 0.79 21-May-2023   CALCIUM 8.3 (L) 05/21/23   GFRNONAA >60 05/21/2023   GFRAA >60 09/22/2015    CRP 28.0 ESR 108  Respiratory Panel Negative    MR Angio head without contrast (Personally reviewed): 1. 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 This is age indeterminate. No obvious cerebral edema or encephalomalacia in that vascular territory. 2. Otherwise negative for age intracranial MRA. 3. Temporal artery biopsy (TAB) is the gold standard in diagnosing giant cell arteritis.  MRI Brain: Pending    ASSESSMENT  [name] is an 89 y.o. female with a PMHx of hypertension and hypothyroidism who presents with severe vision loss OS. Outpatient Ophthalmology concerned vision loss 2/2 to temporal arteritis and instructed to get evaluated at the ED. Inflammatory markers elevated which supports a diagnosis of temporal arteritis.    RECOMMENDATIONS  - 500 mg IV methylprednisolone  for 3 days (ordered) - 60 mg Prednisone  daily following completion of IV methylprednisolone  course (ordered)  - Consult surgery for temporal artery biopsy   - MRI pending  - Continue ASA 81 mg daily  - Swallow evaluation  - Lipid, A1c - Above treatment plan discussed with Dr. Seward Dao by telephone ______________________________________________________________________  Signed, Brayton Calin, MD [facility] Resident - PGY-1   I have seen and examined the patient. I have formulated the assessment and recommendations. 89 year old female presenting with acute onset of left monocular vision loss, malaise and temporal pain. Exam reveals severe left monocular vision loss. Overall presentation is most consistent with temporal arteritis. Recommendations as above.  Electronically signed: Dr. Zayyan Mullen

## 2023-05-08 NOTE — H&P (Signed)
 History and Physical    Patient: [name] TKZ:[id number] DOB: July 20, 1933 DOA: 05/08/2023 DOS: the patient was seen and examined on 05/08/2023 PCP: Jimmey Mould, MD  Patient coming from: Home Chief complaint: Chief Complaint  Patient presents with   Eye Problem   HPI:  [name] is a 89 y.o. 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  Per report patient has been having light sensitivity for about 2 weeks and has not been feeling well has gone to PCP with similar complaints of light sensitivity and no other specific complaints. Pt states that she didn't know she couldn't see until she closed her eyes and then realized. PCP sent her to ophthalmology emergently and was sent here.   ED Course: Pt in ed at bedside  is alert awake oriented afebrile O2 sats 99% on room air.  Vital signs in the ED were notable for the following:  Vitals:   05/08/23 1130 05/08/23 1145 05/08/23 1330 05/08/23 1400  BP: (!) 126/52 (!) 143/56 (!) 131/46 (!) 133/54  Pulse: 86 78 78 77  Temp:      Resp: (!) 22 15 19 15   Height:      Weight:      SpO2: 99% 98% 96% 95%  TempSrc:      BMI (Calculated):      >>ED evaluation thus far shows: -CMP shows potassium 2.9 glucose 120 abnormal LFTs with alk phos 177 AST 174 ALT of 109 GGT 117. - Elevated CRP at 28 and sed rate at 108. - CBC shows leukocytosis of 15.7 hemoglobin of 11.4 platelets 452. - Chest x-ray done today shows atherosclerosis and ectasia of the thoracic aorta no acute airspace disease and an enlarged cardiac silhouette. - MRI of the brain is pending.  >>While in the ED patient received the following: Medications  methylPREDNISolone  sodium succinate (SOLU-MEDROL ) 500 mg in  sodium chloride  0.9 % 50 mL IVPB (has no administration in time range)  heparin  injection 5,000 Units (has no administration in time range)  sodium chloride  flush (NS) 0.9 % injection 3 mL (has no administration in time range)  acetaminophen  (TYLENOL ) tablet 650 mg (has no administration in time range)    Or  acetaminophen  (TYLENOL ) suppository 650 mg (has no administration in time range)  sodium chloride  flush (NS) 0.9 % injection 3-10 mL (has no administration in time range)  sodium chloride  flush (NS) 0.9 % injection 3-10 mL (has no administration in time range)  aspirin  EC tablet 81 mg (has no administration in time range)  methylPREDNISolone  sodium succinate (SOLU-MEDROL ) 500 mg in sodium chloride  0.9 % 50 mL IVPB (0 mg Intravenous Stopped 05/08/23 1329)  HYDROcodone -acetaminophen  (NORCO/VICODIN) 5-325 MG per tablet 1 tablet (1 tablet Oral Given 05/08/23 1244)  potassium chloride  SA (KLOR-CON  M) CR tablet 60 mEq (60 mEq Oral Given 05/08/23 1243)   Review of Systems  Constitutional:  Positive for malaise/fatigue.  Eyes:        Left eye vision loss  Neurological:  Positive for weakness.   Past Medical History:  Diagnosis Date   Acute blood loss anemia    Arthritis    Benign essential HTN    pt denies    Constipation    GERD (gastroesophageal reflux disease)    infrequently - otc med as needed  Heart murmur    "leacky heart valve"agrivated by caffiene / activity - no heart studies have been done per pt   Hyponatremia    Hypothyroidism    Leukocytosis    MVP (mitral valve prolapse)    Primary osteoarthritis of right knee    Rosacea    Shingles 10/2017   Unsteady gait    Past Surgical History:  Procedure Laterality Date   BUNIONECTOMY     CHOLECYSTECTOMY     COLONOSCOPY     2004 & 06/17/12   FINGER SURGERY     rt thumb   TOTAL KNEE ARTHROPLASTY Right 02/02/2015   Procedure: TOTAL RIGHT KNEE ARTHROPLASTY;  Surgeon: Orvan Blanch, MD;  Location: [facility];  Service: Orthopedics;   Laterality: Right;   TOTAL KNEE ARTHROPLASTY Left 09/21/2015   Procedure: LEFT TOTAL KNEE ARTHROPLASTY;  Surgeon: Orvan Blanch, MD;  Location: [facility];  Service: Orthopedics;  Laterality: Left;   TUBAL LIGATION      reports that she has never smoked. She has never used smokeless tobacco. She reports that she does not drink alcohol  and does not use drugs. Allergies  Allergen Reactions   Ampicillin Cough    Has patient had a PCN reaction causing immediate rash, facial/tongue/throat swelling, SOB or lightheadedness with hypotension: No Has patient had a PCN reaction causing severe rash involving mucus membranes or skin necrosis: No Has patient had a PCN reaction that required hospitalization No Has patient had a PCN reaction occurring within the last 10 years: No If all of the above answers are "NO", then may proceed with Cephalosporin use.   Elastic Bandages & [Zinc]    Sulfa Antibiotics Cough   Latex Rash   Family History  Problem Relation Age of Onset   Stroke Mother        ?   Congestive Heart Failure Father    Rheum arthritis Sister    Lung cancer Brother    Prostate cancer Brother    Aneurysm Brother        ?   Lung cancer Brother    Other Brother        open heart surgery   Breast cancer Sister    Dementia Sister    Fibromyalgia Sister    Prior to Admission medications   Medication Sig Start Date End Date Taking? Authorizing Provider  Biotin 1000 MCG tablet Take 1,000 mcg by mouth daily.    [provider]  Calcium Carbonate-Vitamin D  600-200 MG-UNIT TABS Take 1 tablet by mouth daily.    [provider]  Cholecalciferol  (VITAMIN D  PO) Take 1,000-2,000 Units by mouth daily.    [provider]  gabapentin (NEURONTIN) 300 MG capsule Take 300-600 mg by mouth at bedtime.    [provider]  Homeopathic Products (CALENDULA EX) Apply topically.    [provider]  levothyroxine  (SYNTHROID , LEVOTHROID) 88 MCG tablet Take 88 mcg by  mouth. Take 1 tablet daily and on Sunday take only 1/2 tablet.    [provider]  lidocaine  (LIDODERM ) 5 % Place 1-2 patches onto the skin daily. Remove & Discard patch within 12 hours or as directed by MD    [provider]  MAGNESIUM  PO Take 1 tablet by mouth daily.     [provider]  Polyethyl Glycol-Propyl Glycol (SYSTANE OP) Apply 1 drop to eye 2 (two) times daily. Both eyes    [provider]  Pyridoxine  HCl (VITAMIN B-6 PO) Take 1 tablet by mouth daily.  [provider]  traMADol (ULTRAM) 50 MG tablet Take 50 mg by mouth 3 (three) times daily as needed.    [provider]  vitamin C  (ASCORBIC ACID ) 500 MG tablet Take 500 mg by mouth.    [provider]                                                                                 Vitals:   05/08/23 1130 05/08/23 1145 05/08/23 1330 05/08/23 1400  BP: (!) 126/52 (!) 143/56 (!) 131/46 (!) 133/54  Pulse: 86 78 78 77  Resp: (!) 22 15 19 15   Temp:      TempSrc:      SpO2: 99% 98% 96% 95%  Weight:      Height:       Physical Exam Vitals and nursing note reviewed.  Constitutional:      General: She is not in acute distress.    Appearance: Normal appearance. She is not ill-appearing, toxic-appearing or diaphoretic.  HENT:     Head: Normocephalic and atraumatic.     Right Ear: Hearing and external ear normal.     Left Ear: Hearing and external ear normal.     Nose: Nose normal.     Mouth/Throat:     Lips: Pink.     Mouth: Mucous membranes are moist.  Eyes:     General: Lids are normal. No scleral icterus.    Extraocular Movements: Extraocular movements intact.     Right eye: Normal extraocular motion.     Left eye: Normal extraocular motion.     Pupils:     Right eye: Pupil is sluggish.     Left eye: Pupil is sluggish.     Comments: Dilated / minimally reactive.   Neck:     Vascular: No carotid bruit or JVD.  Cardiovascular:     Rate and Rhythm: Normal rate  and regular rhythm.     Pulses:          Dorsalis pedis pulses are 2+ on the right side and 2+ on the left side.       Posterior tibial pulses are 2+ on the right side and 2+ on the left side.     Heart sounds: Normal heart sounds.  Pulmonary:     Effort: Pulmonary effort is normal. No tachypnea, bradypnea, accessory muscle usage, prolonged expiration or respiratory distress.     Breath sounds: No wheezing, rhonchi or rales.  Abdominal:     General: Bowel sounds are normal. There is no distension.     Palpations: Abdomen is soft. There is no mass.     Tenderness: There is no abdominal tenderness.  Musculoskeletal:     Right lower leg: No edema.     Left lower leg: No edema.  Skin:    General: Skin is warm.  Neurological:     General: No focal deficit present.     Mental Status: She is alert and oriented to person, place, and time.     Cranial Nerves: No cranial nerve deficit or facial asymmetry.     Motor: Motor function is intact.     Coordination: Finger-Nose-Finger Test normal.  Deep Tendon Reflexes:     Reflex Scores:      Bicep reflexes are 1+ on the right side and 1+ on the left side.      Patellar reflexes are 1+ on the right side and 1+ on the left side. Psychiatric:        Attention and Perception: Attention normal.        Mood and Affect: Mood normal.        Speech: Speech normal.        Behavior: Behavior is cooperative.     Labs on Admission: I have personally reviewed following labs and imaging studies CBC: Recent Labs  Lab 05/08/23 1135  WBC 15.7*  NEUTROABS 12.8*  HGB 11.4*  HCT 35.7*  MCV 85.6  PLT 452*   Basic Metabolic Panel: Recent Labs  Lab 05/08/23 1135  NA 136  K 2.9*  CL 97*  CO2 24  GLUCOSE 120*  BUN 11  CREATININE 0.79  CALCIUM 8.3*   GFR: Estimated Creatinine Clearance: 37.9 mL/min (by C-G formula based on SCr of 0.79 mg/dL). Liver Function Tests: Recent Labs  Lab 05/08/23 1135  AST 174*  ALT 109*  ALKPHOS 177*  BILITOT  1.0  PROT 6.6  ALBUMIN 2.1*   No results for input(s): "LIPASE", "AMYLASE" in the last 168 hours. No results for input(s): "AMMONIA" in the last 168 hours. Coagulation Profile: No results for input(s): "INR", "PROTIME" in the last 168 hours. Cardiac Enzymes: Recent Labs  Lab 05/08/23 1135  CKTOTAL 21*   Radiological Exams on Admission: DG Chest 2 View Result Date: 05/08/2023 CLINICAL DATA:  Cough, visual changes, temporal arteritis EXAM: CHEST - 2 VIEW COMPARISON:  None Available. FINDINGS: Frontal and lateral views of the chest demonstrate an enlarged cardiac silhouette. There is ectasia and atherosclerosis of the thoracic aorta. No acute airspace disease, effusion, or pneumothorax. No acute bony abnormalities. IMPRESSION: 1. Enlarged cardiac silhouette. 2. No acute airspace disease. 3. Atherosclerosis and ectasia of the thoracic aorta. Electronically Signed   By: Bobbye Burrow M.D.   On: 05/08/2023 14:48   MR ANGIO HEAD WO W CONTRAST Result Date: 05/08/2023 CLINICAL DATA:  89 year old female with headache and vision changes. Possible temporal arteritis. EXAM: MRA HEAD WITHOUT AND WITH CONTRAST TECHNIQUE: Multiplanar, multi-echo pulse sequences of the brain and surrounding structures were acquired without intravenous contrast. Angiographic images of the Circle of Willis were acquired using MRA technique without intravenous contrast. COMPARISON:  Brain MRI 08/07/2017. FINDINGS: Anterior circulation: Antegrade flow in both ICA siphons. No siphon stenosis. Patent ophthalmic artery origins. Patent carotid termini. Patent MCA and ACA origins. Non dominant right A1. Anterior communicating artery and visible bilateral ACA branches are within normal limits. Left MCA M1 segment, bifurcation, left MCA branches are within normal limits. Right MCA M1 segment, trifurcation, visible right MCA branches are within normal limits. Posterior circulation: Pre contrast images demonstrate antegrade flow signal in  codominant distal vertebral arteries, vertebrobasilar junction, basilar artery. SCA, AICA, left PICA origins appear normal. Left PCA is within normal limits. Right PCA is occluded at the P1, P2 junction (series 2, image 85). Posterior communicating arteries are diminutive or absent. Anatomic variants: Dominant left ACA A1 segment. Other findings: No evidence of intracranial mass effect or ventriculomegaly. No encephalomalacia evident in the right PCA territory. Following contrast the visible major dural venous sinuses also appear to be patent. Bilateral temporal artery region neurovascular bundles appear symmetric. No abnormal intracranial enhancement identified. IMPRESSION: 1. 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 This is age indeterminate. No obvious cerebral edema or encephalomalacia in that vascular territory. 2. Otherwise negative for age intracranial MRA. 3. Temporal artery biopsy (TAB) is the gold standard in diagnosing giant cell arteritis. Electronically Signed   By: Marlise Simpers M.D.   On: 05/08/2023 12:52   Data Reviewed: Relevant notes from primary care and specialist visits, past discharge summaries as available in EHR, including Care Everywhere. Prior diagnostic testing as pertinent to current admission diagnoses, Updated medications and problem lists for reconciliation ED course, including vitals, labs, imaging, treatment and response to treatment,Triage notes, nursing and pharmacy notes and ED provider's notes Notable results as noted in HPI.Discussed case with EDMD/ ED APP/ or Specialty MD on call and as needed.  Assessment & Plan  >> Left eye vision loss : 2/2 temporal arteritis per ophthalmology.  Solumedrol high doses for next 2 days total 3/3. PO prednisone  form day 4 for long taper. MRI brain/ 2 d echo with bubble study.  TA biopsy and Neurology and neurosurgery consulted.    >>LBBB on EKG: Negative sgarbossa criteria.  No chest pain or pressure.  No Doe. No  palpitation.  Cardiology consult once TA biopsy and neuro eval is complete.    >>H/O DVT: Pt is high risk for VTE and PE with h/o dvt and knee replacement history. Rule out with dimer.    >>Hypothyroidism: TSH, FT4.  DVT prophylaxis:  Heparin .   Consults:  Neurology. Neuro surgery.  Advance Care Planning:    Code Status: Full Code   Family Communication:  Daughter.  Disposition Plan:  Home  Severity of Illness: The appropriate patient status for this patient is INPATIENT. Inpatient status is judged to be reasonable and necessary in order to provide the required intensity of service to ensure the patient's safety. The patient's presenting symptoms, physical exam findings, and initial radiographic and laboratory data in the context of their chronic comorbidities is felt to place them at high risk for further clinical deterioration. Furthermore, it is not anticipated that the patient will be medically stable for discharge from the hospital within 2 midnights of admission.   * I certify that at the point of admission it is my clinical judgment that the patient will require inpatient hospital care spanning beyond 2 midnights from the point of admission due to high intensity of service, high risk for further deterioration and high frequency of surveillance required.*  Unresulted Labs (From admission, onward)     Start     Ordered   05/09/23 0500  Comprehensive metabolic panel  Tomorrow morning,   R        05/08/23 1453   05/09/23 0500  CBC  Tomorrow morning,   R        05/08/23 1453   05/08/23 1453  T4, free  Add-on,   AD        05/08/23 1453   05/08/23 1453  TSH  Add-on,   AD        05/08/23 1453   05/08/23 1453  Lipid panel  Add-on,   AD        05/08/23 1453   05/08/23 1431  ANA w/Reflex if Positive  Add-on,   AD        05/08/23 1430   05/08/23 1431  Protein electrophoresis, serum  Once,   URGENT        05/08/23 1430   05/08/23 1431  Phosphorus  Add-on,   AD  05/08/23 1430   05/08/23 1431  VITAMIN D  25 Hydroxy (Vit-D Deficiency, Fractures)  Add-on,   AD        05/08/23 1430   05/08/23 1431  Rheumatoid factor  Add-on,   AD        05/08/23 1430   05/08/23 1428  Hepatitis panel, acute  Add-on,   AD        05/08/23 1430   05/08/23 1426  Protime-INR  Once,   STAT        05/08/23 1430   05/08/23 1425  Gamma GT  Add-on,   AD        05/08/23 1430   05/08/23 1425  Hemoglobin A1c  Add-on,   AD        05/08/23 1430   05/08/23 1425  D-dimer, quantitative  ONCE - STAT,   STAT        05/08/23 1430   05/08/23 1230  Magnesium   Add-on,   AD        05/08/23 1229            Orders Placed This Encounter  Procedures   Resp panel by RT-PCR (RSV, Flu A&B, Covid) Anterior Nasal Swab   MR ANGIO HEAD WO W CONTRAST   DG Chest 2 View   MR BRAIN WO CONTRAST   Sedimentation rate   C-reactive protein   CBC with Differential   Comprehensive metabolic panel   CK   Magnesium    Gamma GT   Hemoglobin A1c   D-dimer, quantitative   Protime-INR   Hepatitis panel, acute   ANA w/Reflex if Positive   Protein electrophoresis, serum   Phosphorus   VITAMIN D  25 Hydroxy (Vit-D Deficiency, Fractures)   Rheumatoid factor   Comprehensive metabolic panel   CBC   T4, free   TSH   Lipid panel   Maintain IV access   Vital signs   Notify physician (specify)   Mobility Protocol: No Restrictions RN to initiate protocols based on patient's level of care   Refer to Sidebar Report Refer to ICU, Med-Surg, Progressive, and Step-Down Mobility Protocol Sidebars   Initiate Adult Central Line Maintenance and Catheter Protocol for patients with central line (CVC, PICC, Port, Hemodialysis, Trialysis)   Daily weights   Intake and Output   Do not place and if present remove PureWick   Initiate Oral Care Protocol   Initiate Carrier Fluid Protocol   RN may order General Admission PRN Orders utilizing "General Admission PRN medications" (through manage orders) for the following  patient needs: allergy symptoms (Claritin), cold sores (Carmex), cough (Robitussin DM), eye irritation (Liquifilm Tears), hemorrhoids (Tucks), indigestion (Maalox), minor skin irritation (Hydrocortisone Cream), muscle pain Lovena Rubinstein Gay), nose irritation (saline nasal spray) and sore throat (Chloraseptic spray).   Cardiac Monitoring - Continuous Indefinite   Swallow screen   Full code   Consult to neurology  Pca stroke, possible temporal arteritis   Consult to hospitalist   Consult to vascular surgery  Temporal artery biopsy   Pulse oximetry check with vital signs   Oxygen therapy Mode or (Route): Nasal cannula; Liters Per Minute: 2; Keep O2 saturation between: greater than 92 %   EKG 12-Lead   ECHOCARDIOGRAM COMPLETE BUBBLE STUDY   Insert peripheral IV   Admit to Inpatient (patient's expected length of stay will be greater than 2 midnights or inpatient only procedure)   Aspiration precautions   Fall precautions    Author: Lavanda Porter, MD 12 pm -8 pm. 05/08/2023 2:55 PM >>  Please note for any concern,or critical results after hours past 8pm please contact the [facility] hospitalist [facility] floor coverage provider from 7 PM- 7 AM. For on call review [email or web address], username [email or web address] and PW: your phone number<<

## 2023-05-08 NOTE — ED Notes (Signed)
 Transported to MRI

## 2023-05-09 ENCOUNTER — Inpatient Hospital Stay (HOSPITAL_COMMUNITY)

## 2023-05-09 ENCOUNTER — Encounter (HOSPITAL_COMMUNITY)
Admission: EM | Disposition: A | Discharge status: Home / Self Care | Payer: Self-pay | Source: Ambulatory Visit | Attending: Internal Medicine

## 2023-05-09 ENCOUNTER — Encounter (HOSPITAL_COMMUNITY): Payer: Self-pay | Admitting: Internal Medicine

## 2023-05-09 ENCOUNTER — Other Ambulatory Visit: Payer: Self-pay

## 2023-05-09 ENCOUNTER — Inpatient Hospital Stay (HOSPITAL_COMMUNITY): Admitting: Certified Registered Nurse Anesthetist

## 2023-05-09 DIAGNOSIS — I2510 Atherosclerotic heart disease of native coronary artery without angina pectoris: Secondary | ICD-10-CM | POA: Diagnosis not present

## 2023-05-09 DIAGNOSIS — H5462 Unqualified visual loss, left eye, normal vision right eye: Secondary | ICD-10-CM

## 2023-05-09 DIAGNOSIS — I5021 Acute systolic (congestive) heart failure: Secondary | ICD-10-CM | POA: Diagnosis not present

## 2023-05-09 DIAGNOSIS — H53132 Sudden visual loss, left eye: Secondary | ICD-10-CM | POA: Diagnosis not present

## 2023-05-09 DIAGNOSIS — H538 Other visual disturbances: Secondary | ICD-10-CM

## 2023-05-09 DIAGNOSIS — I110 Hypertensive heart disease with heart failure: Secondary | ICD-10-CM | POA: Diagnosis not present

## 2023-05-09 HISTORY — PX: ARTERY BIOPSY: SHX891

## 2023-05-09 LAB — CBC
HCT: 34.6 % — ABNORMAL LOW (ref 36.0–46.0)
Hemoglobin: 11.4 g/dL — ABNORMAL LOW (ref 12.0–15.0)
MCH: 27.4 pg (ref 26.0–34.0)
MCHC: 32.9 g/dL (ref 30.0–36.0)
MCV: 83.2 fL (ref 80.0–100.0)
Platelets: 426 10*3/uL — ABNORMAL HIGH (ref 150–400)
RBC: 4.16 MIL/uL (ref 3.87–5.11)
RDW: 13.9 % (ref 11.5–15.5)
WBC: 11.1 10*3/uL — ABNORMAL HIGH (ref 4.0–10.5)
nRBC: 0 % (ref 0.0–0.2)

## 2023-05-09 LAB — COMPREHENSIVE METABOLIC PANEL WITH GFR
ALT: 111 U/L — ABNORMAL HIGH (ref 0–44)
AST: 173 U/L — ABNORMAL HIGH (ref 15–41)
Albumin: 2 g/dL — ABNORMAL LOW (ref 3.5–5.0)
Alkaline Phosphatase: 221 U/L — ABNORMAL HIGH (ref 38–126)
Anion gap: 13 (ref 5–15)
BUN: 11 mg/dL (ref 8–23)
CO2: 23 mmol/L (ref 22–32)
Calcium: 8.5 mg/dL — ABNORMAL LOW (ref 8.9–10.3)
Chloride: 100 mmol/L (ref 98–111)
Creatinine, Ser: 0.57 mg/dL (ref 0.44–1.00)
GFR, Estimated: >60 mL/min (ref >60)
Glucose, Bld: 180 mg/dL — ABNORMAL HIGH (ref 70–99)
Potassium: 4.3 mmol/L (ref 3.5–5.1)
Sodium: 136 mmol/L (ref 135–145)
Total Bilirubin: 0.8 mg/dL (ref 0.0–1.2)
Total Protein: 6.8 g/dL (ref 6.5–8.1)

## 2023-05-09 LAB — PROCALCITONIN: Procalcitonin: <0.1 ng/mL

## 2023-05-09 LAB — GLUCOSE, CAPILLARY
Glucose-Capillary: 275 mg/dL — ABNORMAL HIGH (ref 70–99)
Glucose-Capillary: 182 mg/dL — ABNORMAL HIGH (ref 70–99)

## 2023-05-09 LAB — RHEUMATOID FACTOR: Rheumatoid fact SerPl-aCnc: 14.6 [IU]/mL — ABNORMAL HIGH (ref <14.0)

## 2023-05-09 SURGERY — BIOPSY TEMPORAL ARTERY
Anesthesia: Choice | Laterality: Left

## 2023-05-09 MED ORDER — 0.9 % SODIUM CHLORIDE (POUR BTL) OPTIME
TOPICAL | Status: DC | PRN
  Administered 2023-05-09: 1000 mL

## 2023-05-09 MED ORDER — FENTANYL CITRATE (PF) 250 MCG/5ML IJ SOLN
INTRAMUSCULAR | Status: DC | PRN
  Administered 2023-05-09 (×2): 25 ug via INTRAVENOUS

## 2023-05-09 MED ORDER — HEPARIN (PORCINE) 25000 UT/250ML-% IV SOLN
900.0000 [IU]/h | INTRAVENOUS | Status: DC
  Administered 2023-05-09: 750 [IU]/h via INTRAVENOUS
  Filled 2023-05-09: qty 250

## 2023-05-09 MED ORDER — ORAL CARE MOUTH RINSE: 15.0000 mL | Freq: Once | OROMUCOSAL | Status: AC

## 2023-05-09 MED ORDER — PROPOFOL 500 MG/50ML IV EMUL
INTRAVENOUS | Status: DC | PRN
  Administered 2023-05-09: 125 ug/kg/min via INTRAVENOUS

## 2023-05-09 MED ORDER — SODIUM CHLORIDE 0.9 % IV SOLN: INTRAVENOUS | Status: DC

## 2023-05-09 MED ORDER — CHLORHEXIDINE GLUCONATE 0.12 % MT SOLN
15.0000 mL | Freq: Once | OROMUCOSAL | Status: AC
  Administered 2023-05-09: 15 mL via OROMUCOSAL
  Filled 2023-05-09: qty 15

## 2023-05-09 MED ORDER — INSULIN ASPART 100 UNIT/ML IJ SOLN
0.0000 [IU] | Freq: Three times a day (TID) | INTRAMUSCULAR | Status: DC
Start: 2023-05-09 — End: 2023-05-12
  Administered 2023-05-09: 5 [IU] via SUBCUTANEOUS
  Administered 2023-05-10: 2 [IU] via SUBCUTANEOUS
  Administered 2023-05-10: 1 [IU] via SUBCUTANEOUS
  Administered 2023-05-10: 5 [IU] via SUBCUTANEOUS
  Administered 2023-05-11: 1 [IU] via SUBCUTANEOUS
  Administered 2023-05-11: 3 [IU] via SUBCUTANEOUS
  Administered 2023-05-11: 2 [IU] via SUBCUTANEOUS

## 2023-05-09 MED ORDER — PHENYLEPHRINE HCL-NACL 20-0.9 MG/250ML-% IV SOLN
INTRAVENOUS | Status: DC | PRN
  Administered 2023-05-09: 10 ug/min via INTRAVENOUS

## 2023-05-09 MED ORDER — FENTANYL CITRATE (PF) 250 MCG/5ML IJ SOLN
INTRAMUSCULAR | Status: AC
  Filled 2023-05-09: qty 5

## 2023-05-09 MED ORDER — MAGNESIUM OXIDE -MG SUPPLEMENT 400 (240 MG) MG PO TABS
400.0000 mg | ORAL_TABLET | Freq: Every day | ORAL | Status: DC
  Administered 2023-05-09 – 2023-05-11 (×3): 400 mg via ORAL
  Filled 2023-05-09 (×3): qty 1

## 2023-05-09 MED ORDER — LACTATED RINGERS IV SOLN: INTRAVENOUS | Status: AC

## 2023-05-09 MED ORDER — ADULT MULTIVITAMIN W/MINERALS CH
1.0000 | ORAL_TABLET | Freq: Every day | ORAL | Status: DC
  Administered 2023-05-09 – 2023-05-11 (×3): 1 via ORAL
  Filled 2023-05-09 (×3): qty 1

## 2023-05-09 MED ORDER — GLYCOPYRROLATE PF 0.2 MG/ML IJ SOSY
PREFILLED_SYRINGE | INTRAMUSCULAR | Status: AC
  Filled 2023-05-09: qty 1

## 2023-05-09 MED ORDER — MAGIC MOUTHWASH
5.0000 mL | Freq: Four times a day (QID) | ORAL | Status: DC
  Administered 2023-05-09 – 2023-05-12 (×9): 5 mL via ORAL
  Filled 2023-05-09 (×14): qty 5

## 2023-05-09 MED ORDER — GLUCERNA SHAKE PO LIQD
237.0000 mL | Freq: Three times a day (TID) | ORAL | Status: DC
  Administered 2023-05-09 – 2023-05-12 (×8): 237 mL via ORAL

## 2023-05-09 MED ORDER — LIDOCAINE HCL (PF) 1 % IJ SOLN
INTRAMUSCULAR | Status: AC
  Filled 2023-05-09: qty 30

## 2023-05-09 MED ORDER — CEFAZOLIN SODIUM 1 G IJ SOLR
INTRAMUSCULAR | Status: AC
  Filled 2023-05-09: qty 20

## 2023-05-09 MED ORDER — CEFAZOLIN SODIUM-DEXTROSE 1-4 GM/50ML-% IV SOLN
INTRAVENOUS | Status: DC | PRN
  Administered 2023-05-09: 2 g via INTRAVENOUS

## 2023-05-09 MED ORDER — LEVOTHYROXINE SODIUM 88 MCG PO TABS: 44.0000 ug | ORAL_TABLET | ORAL | Status: DC

## 2023-05-09 MED ORDER — LIDOCAINE HCL (PF) 1 % IJ SOLN
INTRAMUSCULAR | Status: DC | PRN
  Administered 2023-05-09: 30 mL

## 2023-05-09 MED ORDER — ONDANSETRON HCL 4 MG/2ML IJ SOLN
INTRAMUSCULAR | Status: DC | PRN
  Administered 2023-05-09: 4 mg via INTRAVENOUS

## 2023-05-09 MED ORDER — LEVOTHYROXINE SODIUM 88 MCG PO TABS
88.0000 ug | ORAL_TABLET | ORAL | Status: DC
  Administered 2023-05-10: 88 ug via ORAL
  Filled 2023-05-09: qty 1

## 2023-05-09 MED ORDER — PROPOFOL 10 MG/ML IV BOLUS
INTRAVENOUS | Status: AC
  Filled 2023-05-09: qty 20

## 2023-05-09 MED ORDER — PANTOPRAZOLE SODIUM 40 MG PO TBEC
40.0000 mg | DELAYED_RELEASE_TABLET | Freq: Every day | ORAL | Status: DC
  Administered 2023-05-09 – 2023-05-12 (×4): 40 mg via ORAL
  Filled 2023-05-09 (×4): qty 1

## 2023-05-09 MED ORDER — VITAMIN D 25 MCG (1000 UNIT) PO TABS
2000.0000 [IU] | ORAL_TABLET | Freq: Every day | ORAL | Status: DC
  Administered 2023-05-09 – 2023-05-11 (×3): 2000 [IU] via ORAL
  Filled 2023-05-09 (×3): qty 2

## 2023-05-09 MED ORDER — IOHEXOL 350 MG/ML SOLN
75.0000 mL | Freq: Once | INTRAVENOUS | Status: AC | PRN
Start: 2023-05-09 — End: 2023-05-09
  Administered 2023-05-09: 75 mL via INTRAVENOUS

## 2023-05-09 SURGICAL SUPPLY — 35 items
CANISTER SUCT 3000ML PPV (MISCELLANEOUS) ×1 IMPLANT
CLIP TI WIDE RED SMALL 6 (CLIP) ×1 IMPLANT
CNTNR URN SCR LID CUP LEK RST (MISCELLANEOUS) ×1 IMPLANT
COTTONBALL LRG STERILE PKG (GAUZE/BANDAGES/DRESSINGS) ×1 IMPLANT
COVER SURGICAL LIGHT HANDLE (MISCELLANEOUS) ×1 IMPLANT
DERMABOND ADVANCED .7 DNX12 (GAUZE/BANDAGES/DRESSINGS) ×1 IMPLANT
DRAPE HALF SHEET 40X57 (DRAPES) IMPLANT
DRAPE OPHTHALMIC 77X100 STRL (CUSTOM PROCEDURE TRAY) ×1 IMPLANT
ELECT NDL BLADE 2-5/6 (NEEDLE) ×1 IMPLANT
ELECT NEEDLE BLADE 2-5/6 (NEEDLE) ×1 IMPLANT
ELECTRODE REM PT RTRN 9FT ADLT (ELECTROSURGICAL) ×1 IMPLANT
GAUZE 4X4 16PLY ~~LOC~~+RFID DBL (SPONGE) ×1 IMPLANT
GEL ULTRASOUND 8.5O AQUASONIC (MISCELLANEOUS) ×1 IMPLANT
GLOVE BIO SURGEON STRL SZ7.5 (GLOVE) ×1 IMPLANT
GLOVE BIOGEL PI IND STRL 8 (GLOVE) ×1 IMPLANT
GOWN STRL REUS W/ TWL LRG LVL3 (GOWN DISPOSABLE) ×1 IMPLANT
GOWN STRL REUS W/ TWL XL LVL3 (GOWN DISPOSABLE) ×1 IMPLANT
KIT BASIN OR (CUSTOM PROCEDURE TRAY) ×1 IMPLANT
KIT TURNOVER KIT B (KITS) ×1 IMPLANT
NDL HYPO 25GX1X1/2 BEV (NEEDLE) ×1 IMPLANT
NEEDLE HYPO 25GX1X1/2 BEV (NEEDLE) ×1 IMPLANT
NS IRRIG 1000ML POUR BTL (IV SOLUTION) ×1 IMPLANT
PACK GENERAL/GYN (CUSTOM PROCEDURE TRAY) ×1 IMPLANT
PAD ARMBOARD POSITIONER FOAM (MISCELLANEOUS) ×2 IMPLANT
SPIKE FLUID TRANSFER (MISCELLANEOUS) ×1 IMPLANT
SUCTION TUBE FRAZIER 10FR DISP (SUCTIONS) ×1 IMPLANT
SUT MNCRL AB 4-0 PS2 18 (SUTURE) ×1 IMPLANT
SUT PROLENE 6 0 BV (SUTURE) IMPLANT
SUT SILK 3-0 18XBRD TIE 12 (SUTURE) IMPLANT
SUT VIC AB 3-0 SH 27X BRD (SUTURE) ×1 IMPLANT
SYR CONTROL 10ML LL (SYRINGE) ×1 IMPLANT
TOWEL GREEN STERILE (TOWEL DISPOSABLE) ×1 IMPLANT
TUBE CONNECTING 20X1/4 (TUBING) IMPLANT
VASCULAR TIE MINI RED 18IN STL (MISCELLANEOUS) ×1 IMPLANT
WATER STERILE IRR 1000ML POUR (IV SOLUTION) ×1 IMPLANT

## 2023-05-09 NOTE — Progress Notes (Signed)
 Pharmacy Consult for Heparin  Indication: pulmonary embolus  Allergies  Allergen Reactions   Ampicillin Cough    Has patient had a PCN reaction causing immediate rash, facial/tongue/throat swelling, SOB or lightheadedness with hypotension: No Has patient had a PCN reaction causing severe rash involving mucus membranes or skin necrosis: No Has patient had a PCN reaction that required hospitalization No Has patient had a PCN reaction occurring within the last 10 years: No If all of the above answers are "NO", then may proceed with Cephalosporin use.   Elastic Bandages & [Zinc]    Sulfa Antibiotics Cough   Latex Rash    Patient Measurements: Height: 4\' 9"  (144.8 cm) Weight: 61.2 kg (134 lb 14.7 oz) IBW/kg (Calculated) : 38.6 HEPARIN  DW (KG): 52.7  Vital Signs: Temp: 97.8 F (36.6 C) (05/02 1650) Temp Source: Oral (05/02 1650) BP: 146/60 (05/02 1650) Pulse Rate: 89 (05/02 1650)  Labs: Recent Labs    05/08/23 1135 05/09/23 0532  HGB 11.4* 11.4*  HCT 35.7* 34.6*  PLT 452* 426*  LABPROT 16.6*  --   INR 1.3*  --   CREATININE 0.79 0.57  CKTOTAL 21*  --     Estimated Creatinine Clearance: 37.2 mL/min (by C-G formula based on SCr of 0.57 mg/dL).  Assessment: DESANI RUDIN a 89 y.o. female with PE s/p L temporal artery biopsy. Pharmacy has been consulted for heparin  dosing- no bolus, targeting lower end of therapeutic goal at MD request iven proximity to temporal artery biopsy.   Last [place] prophylactic heparin  dose @ 1546. Hgb 11s, PLT 400s.   Anticoagulation PTA: Patient not on anticoagulation PTA.  Goal of Therapy:  Heparin  level 0.3-0.5 Monitor platelets by anticoagulation protocol: Yes   Plan:  Start heparin  gtt  @ 750 units/hour  Heparin  level in 8 hours  CBC/HL daily F/U [facility] plan, ability to bolus (if necessary)   Chrystie Crass, PharmD Clinical Pharmacist  05/09/2023 6:14 PM

## 2023-05-09 NOTE — Progress Notes (Signed)
 PT Cancellation Note  Patient Details Name: [name] MRN: [id number] DOB: 11/16/1933   Cancelled Treatment:    Reason Eval/Treat Not Completed: Patient at procedure or test/unavailable  Currently off unit for testing. Order acknowledged. Will plan for comprehensive PT evaluation tomorrow.  Jory Ng, PT, DPT [facility]  Rehabilitation Services Physical Therapist Office: [phone] Website: [email or web address]   Alinda Irani 05/09/2023, 5:26 PM

## 2023-05-09 NOTE — Plan of Care (Signed)

## 2023-05-09 NOTE — Transfer of Care (Signed)
 Immediate Anesthesia Transfer of Care Note  Patient: [name]  Procedure(s) Performed: BIOPSY TEMPORAL ARTERY (Left)  Patient Location: PACU  Anesthesia Type:MAC  Level of Consciousness: drowsy  Airway & Oxygen Therapy: Patient Spontanous Breathing and Patient connected to face mask oxygen  Post-op Assessment: Report given to RN and Post -op Vital signs reviewed and stable  Post vital signs: Reviewed and stable  Last Vitals:  Vitals Value Taken Time  BP    Temp 36.4 C 05/09/23 0907  Pulse 57 05/09/23 0908  Resp 16 05/09/23 0908  SpO2 92 % 05/09/23 0908  Vitals shown include unfiled device data.  Last Pain:  Vitals:   05/09/23 0907  TempSrc:   PainSc: Asleep         Complications: No notable events documented.

## 2023-05-09 NOTE — Progress Notes (Signed)
 PROGRESS NOTE    ALETTA BIXLER  ZOX:[id number] DOB: February 02, 1933 DOA: 05/08/2023 PCP: Jimmey Mould, MD  Chief Complaint  Patient presents with   Eye Problem    Brief Narrative:   SOLIEL Hoffman is a 89 y.o. 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  Inflammatory markers are elevated, would support this diagnosis.  Assessment & Plan:   Principal Problem:   Vision loss of left eye  Left eye vision loss Temporal arteritis -Presents with vision loss in left eye, significantly inflammatory markers, including CRP, ESR and D-dimers -High clinical concern for temporal arteritis. - Continue with high-dose IV steroids, 500 mg IV Solu-Medrol  x 3 days, then prolonged steroid taper. 2/2 temporal arteritis per ophthalmology.  -Will start on Protonix  for GI prophylaxis -Vascular surgery input greatly appreciated, status post left temporal artery biopsy.  Chronic systolic/diastolic CHF -The echo significant for EF 40%, with global hypokinesis, worse on the septum, and septal lateral desynchrony, assistant with left bundle branch block as well grade 1 diastolic dysfunction -she does not appear to be in any volume overload, no previous echo to compare -Continue to monitor on telemetry, will start on low-dose beta-blocker and start GDMT gradually if tolerates   LBBB on EKG: -No chest pain, no shortness of breath, troponins are negative x 2 -As an outpatient when stable   Elevated D-dimers -Dimer significantly elevated, but currently this is most likely in the setting of acute inflammatory response from her temporal arteritis, but I will obtain venous Dopplers, and CT PE protocol given its significantly elevated.    hypothyroidism -Continue with home dose Synthroid  -Both TSH, and free T4 is elevated, so I will keep current dose as these lab abnormalities most likely in the setting of sick euthyroid syndrome.   Transaminitis -most likely in the setting of inflammatory response as all inflammatory markers are elevated, right upper quadrant ultrasound, with no acute findings, hepatitis panel was negative  Dysphagia Weight loss - Patient reports history of oral thrush, treated with nystatin mouthwash recently, as well she is having some significant dysphagia and odynophagia, will resume on Magic mouthwash, she does report significant weight loss recently as well, and does appear to be having some tenderness in the neck area concerning for cervical lymphadenopathy, clinical concern for malignancy, so I will proceed with pan CT scan for further evaluation  Failure to thrive - Significant weight loss, weakness, deconditioning, movement significantly low at 2, will consult nutritionist and PT/OT  DVT prophylaxis: Subcu heparin  Code Status: Full code Family Communication: Discussed with daughter at bedside Disposition:   Status is: Inpatient    Consultants:  Neurology vascular   Sbjective:  Patient reports difficulty swallowing, weakness, fatigue and generalized body ache  Objective: Vitals:   05/09/23 0907 05/09/23 0915 05/09/23 0922 05/09/23 1224  BP: 124/64 135/69 123/60 (!) 154/58  Pulse: (!) 58 65 68 78  Resp: 15 18 19 20   Temp: (!) 97.5 F (36.4 C)  (!) 97.5 F (36.4 C) 97.6 F (36.4 C)  TempSrc:    Oral  SpO2: 93% 93% 93% 95%  Weight:      Height:        Intake/Output Summary (Last 24 hours) at 05/09/2023 1406 Last data filed at 05/09/2023 0858 Gross per 24 hour  Intake 550 ml  Output --  Net 550 ml   Filed Weights   05/08/23 1105 05/09/23 0433  Weight: 63 kg 61.2 kg    Examination:  Awake Alert, Oriented X 3, No new F.N deficits, Normal affect Symmetrical Chest wall  movement, Good air movement bilaterally, CTAB RRR,No Gallops,Rubs or new Murmurs, No Parasternal Heave +ve B.Sounds, Abd Soft, No tenderness, No rebound - guarding or rigidity. No Cyanosis, Clubbing or edema, No new Rash or bruise      Data Reviewed: I have personally reviewed following labs and imaging studies  CBC: Recent Labs  Lab 05/08/23 1135 05/09/23 0532  WBC 15.7* 11.1*  NEUTROABS 12.8*  --   HGB 11.4* 11.4*  HCT 35.7* 34.6*  MCV 85.6 83.2  PLT 452* 426*    Basic Metabolic Panel: Recent Labs  Lab 05/08/23 1135 05/08/23 1300 05/09/23 0532  NA 136  --  136  K 2.9*  --  4.3  CL 97*  --  100  CO2 24  --  23  GLUCOSE 120*  --  180*  BUN 11  --  11  CREATININE 0.79  --  0.57  CALCIUM 8.3*  --  8.5*  MG  --  2.0  --   PHOS 3.2  --   --     GFR: Estimated Creatinine Clearance: 37.2 mL/min (by C-G formula based on SCr of 0.57 mg/dL).  Liver Function Tests: Recent Labs  Lab 05/08/23 1135 05/09/23 0532  AST 174* 173*  ALT 109* 111*  ALKPHOS 177* 221*  BILITOT 1.0 0.8  PROT 6.6 6.8  ALBUMIN 2.1* 2.0*    CBG: No results for input(s): "GLUCAP" in the last 168 hours.   Recent Results (from the past 240 hours)  Resp panel by RT-PCR (RSV, Flu A&B, Covid) Anterior Nasal Swab     Status: None   Collection Time: 05/08/23  1:29 PM   Specimen: Anterior Nasal Swab  Result Value Ref Range Status   SARS Coronavirus 2 by RT PCR NEGATIVE NEGATIVE Final   Influenza A by PCR NEGATIVE NEGATIVE Final   Influenza B by PCR NEGATIVE NEGATIVE Final    Comment: (NOTE) The Xpert Xpress SARS-CoV-2/FLU/RSV plus assay is intended as an aid in the diagnosis of influenza from Nasopharyngeal swab specimens and should not be used as a sole basis for treatment. Nasal washings and aspirates are unacceptable for Xpert Xpress SARS-CoV-2/FLU/RSV testing.  Fact Sheet for Patients: BloggerCourse.com  Fact Sheet for Healthcare  Providers: SeriousBroker.it  This test is not yet approved or cleared by the [place]  FDA and has been authorized for detection and/or diagnosis of SARS-CoV-2 by FDA under an Emergency Use Authorization (EUA). This EUA will remain in effect (meaning this test can be used) for the duration of the COVID-19 declaration under Section 564(b)(1) of the Act, 21 U.S.C. section 360bbb-3(b)(1), unless the authorization is terminated or revoked.     Resp Syncytial Virus by PCR NEGATIVE NEGATIVE Final    Comment: (NOTE) Fact Sheet for Patients: BloggerCourse.com  Fact Sheet for Healthcare Providers: SeriousBroker.it  This test is not yet approved or cleared by the [place]  FDA and has been authorized for detection and/or diagnosis of SARS-CoV-2 by FDA under an Emergency Use Authorization (EUA). This EUA will remain in effect (meaning this test can be used) for the duration of the COVID-19 declaration under Section 564(b)(1) of the Act, 21 U.S.C. section 360bbb-3(b)(1), unless the authorization is terminated or revoked.  Performed at [facility], [address] [place], [place] [address]          Radiology Studies: MR BRAIN WO CONTRAST Result Date: 05/08/2023 CLINICAL DATA:  Initial evaluation for possible PCA infarct. EXAM: MRI HEAD WITHOUT CONTRAST TECHNIQUE: Multiplanar, multiecho pulse sequences of the brain and surrounding structures were obtained without intravenous contrast. COMPARISON:  Prior MRA from earlier the same day as well as prior MRI from 08/07/2017. FINDINGS: Brain: Cerebral volume within normal limits for age. Scattered patchy T2/FLAIR hyperintensity involving the periventricular deep white matter both cerebral hemispheres, most characteristic of chronic microvascular ischemic disease. No abnormal foci of restricted diffusion to suggest acute or subacute ischemia. No areas of  chronic cortical infarction. Apparent diffuse FLAIR hyperintensity seen throughout the subarachnoid spaces on FLAIR sequence noted (series 11, image 25 for example), favored to be artifactual as no signal changes are seen on corresponding sequences. No associated susceptibility artifact to suggest hemorrhage. No other acute or chronic intracranial blood products. No mass lesion, midline shift or mass effect. No hydrocephalus or extra-axial fluid collection. Pituitary gland within normal limits. Vascular: Major intracranial vascular flow voids are maintained by MRI. Skull and upper cervical spine: Degenerative thickening about the tectorial membrane with mild stenosis at the cervicomedullary junction (series 9, image 12). Bone marrow signal intensity within normal limits. No scalp soft tissue abnormality. Sinuses/Orbits: Prior bilateral ocular lens replacement. Paranasal sinuses are largely clear. Trace bilateral mastoid effusions noted, bowel significance. Other: None. IMPRESSION: 1. Apparent diffusely increased FLAIR hyperintensity throughout the subarachnoid spaces as above, felt to be most consistent with artifact on this exam. Sequelae of meningitis would be the primary differential consideration, although again, this is strongly favored to be artifactual in nature. 2. No other acute intracranial abnormality. No evidence for acute or chronic right PCA distribution infarct. 3. Mild chronic microvascular ischemic disease for age. Electronically Signed   By: Virgia Griffins M.D.   On: 05/08/2023 21:52   US  Abdomen Limited RUQ (LIVER/GB) Result Date: 05/08/2023 EXAM: Right Upper Quadrant Abdominal Ultrasound TECHNIQUE: Real-time ultrasonography of the right upper quadrant of the abdomen was performed. COMPARISON: None. CLINICAL HISTORY: Abnormal LFTs FINDINGS: LIVER: The liver demonstrates normal echogenicity. No evidence of intrahepatic biliary ductal dilatation. No evidence of mass. BILIARY SYSTEM: Status  post cholecystectomy. Common bile duct measures 8 mm, within the upper limits of normal. OTHER: No evidence of right upper quadrant ascites. IMPRESSION: 1. Status post cholecystectomy. 2. Otherwise unremarkable. Electronically signed by: Zadie Herter MD 05/08/2023 08:57 PM EDT RP Workstation: [id number]   ECHOCARDIOGRAM COMPLETE BUBBLE STUDY Result Date: 05/08/2023    ECHOCARDIOGRAM REPORT   Patient Name:   ILMA LIMBACH Date of Exam: 05/08/2023 Medical Rec #:  [id number]          Height:       57.0 in Accession #:    [id number]         Weight:       139.0 lb Date of Birth:  Jan 27, 1933          BSA:          1.541 m Patient Age:    89 years           BP:           133/54 mmHg Patient Gender: F                  HR:           76 bpm. Exam Location:  Inpatient Procedure: 2D Echo, Cardiac Doppler, Color Doppler  and Saline Contrast Bubble            Study (Both Spectral and Color Flow Doppler were utilized during            procedure). Indications:    Stroke 434.91 / I63.9  History:        Patient has no prior history of Echocardiogram examinations.                 Risk Factors:Dyslipidemia.  Sonographer:    Terrilee Few RCS Referring Phys: [phone] EKTA V PATEL IMPRESSIONS  1. Normal left ventricular size. Left ventricular ejection fraction, by estimation, is 40%. The left ventricle has mild to moderately decreased function. The left ventricle demonstrates global hypokinesis worse in the septum, septal-lateral dyssynchrony  consistent with LBBB. Left ventricular diastolic parameters are consistent with Grade I diastolic dysfunction (impaired relaxation).  2. Right ventricular systolic function is normal. The right ventricular size is normal. There is normal pulmonary artery systolic pressure. The estimated right ventricular systolic pressure is 16.0 mmHg.  3. Bubble study negative, no PFO or ASD.  4. The mitral valve is degenerative. Mild mitral valve regurgitation. No evidence of mitral stenosis. Moderate  mitral annular calcification.  5. The aortic valve is tricuspid. Aortic valve regurgitation is mild. No aortic stenosis is present.  6. The inferior vena cava is normal in size with greater than 50% respiratory variability, suggesting right atrial pressure of 3 mmHg. FINDINGS  Left Ventricle: Left ventricular ejection fraction, by estimation, is 40%. The left ventricle has mild to moderately decreased function. The left ventricle demonstrates global hypokinesis. The left ventricular internal cavity size was normal in size. There is no left ventricular hypertrophy. Left ventricular diastolic parameters are consistent with Grade I diastolic dysfunction (impaired relaxation). Right Ventricle: The right ventricular size is normal. No increase in right ventricular wall thickness. Right ventricular systolic function is normal. There is normal pulmonary artery systolic pressure. The tricuspid regurgitant velocity is 1.80 m/s, and  with an assumed right atrial pressure of 3 mmHg, the estimated right ventricular systolic pressure is 16.0 mmHg. Left Atrium: Left atrial size was normal in size. Right Atrium: Right atrial size was normal in size. Pericardium: There is no evidence of pericardial effusion. Mitral Valve: The mitral valve is degenerative in appearance. There is mild calcification of the mitral valve leaflet(s). Moderate mitral annular calcification. Mild mitral valve regurgitation. No evidence of mitral valve stenosis. Tricuspid Valve: The tricuspid valve is normal in structure. Tricuspid valve regurgitation is trivial. Aortic Valve: The aortic valve is tricuspid. Aortic valve regurgitation is mild. Aortic regurgitation PHT measures 597 msec. No aortic stenosis is present. Aortic valve peak gradient measures 5.7 mmHg. Pulmonic Valve: The pulmonic valve was normal in structure. Pulmonic valve regurgitation is not visualized. Aorta: The aortic root is normal in size and structure. Venous: The inferior vena cava is  normal in size with greater than 50% respiratory variability, suggesting right atrial pressure of 3 mmHg. IAS/Shunts: Bubble study negative, no PFO or ASD. Agitated saline contrast was given intravenously to evaluate for intracardiac shunting.  LEFT VENTRICLE PLAX 2D LVIDd:         4.10 cm   Diastology LVIDs:         2.90 cm   LV e' medial:    6.20 cm/s LV PW:         1.00 cm   LV E/e' medial:  14.2 LV IVS:        0.60 cm   LV  e' lateral:   5.22 cm/s LVOT diam:     2.00 cm   LV E/e' lateral: 16.9 LV SV:         52 LV SV Index:   34 LVOT Area:     3.14 cm  RIGHT VENTRICLE             IVC RV S prime:     10.40 cm/s  IVC diam: 1.40 cm TAPSE (M-mode): 2.0 cm LEFT ATRIUM           Index        RIGHT ATRIUM           Index LA diam:      3.00 cm 1.95 cm/m   RA Area:     10.20 cm LA Vol (A2C): 37.8 ml 24.53 ml/m  RA Volume:   20.40 ml  13.24 ml/m LA Vol (A4C): 24.7 ml 16.03 ml/m  AORTIC VALVE AV Area (Vmax): 2.31 cm AV Vmax:        119.00 cm/s AV Peak Grad:   5.7 mmHg LVOT Vmax:      87.60 cm/s LVOT Vmean:     55.400 cm/s LVOT VTI:       0.165 m AI PHT:         597 msec  AORTA Ao Root diam: 2.50 cm Ao Asc diam:  3.30 cm MITRAL VALVE                TRICUSPID VALVE MV Area (PHT): 3.85 cm     TR Peak grad:   13.0 mmHg MV Decel Time: 197 msec     TR Vmax:        180.00 cm/s MR Peak grad: 24.4 mmHg MR Vmax:      247.00 cm/s   SHUNTS MV E velocity: 88.00 cm/s   Systemic VTI:  0.16 m MV A velocity: 141.00 cm/s  Systemic Diam: 2.00 cm MV E/A ratio:  0.62 Dalton McleanMD Electronically signed by Archer Bear Signature Date/Time: 05/08/2023/4:28:30 PM    Final    DG Chest 2 View Result Date: 05/08/2023 CLINICAL DATA:  Cough, visual changes, temporal arteritis EXAM: CHEST - 2 VIEW COMPARISON:  None Available. FINDINGS: Frontal and lateral views of the chest demonstrate an enlarged cardiac silhouette. There is ectasia and atherosclerosis of the thoracic aorta. No acute airspace disease, effusion, or pneumothorax. No acute  bony abnormalities. IMPRESSION: 1. Enlarged cardiac silhouette. 2. No acute airspace disease. 3. Atherosclerosis and ectasia of the thoracic aorta. Electronically Signed   By: Bobbye Burrow M.D.   On: 05/08/2023 14:48   MR ANGIO HEAD WO W CONTRAST Result Date: 05/08/2023 CLINICAL DATA:  89 year old female with headache and vision changes. Possible temporal arteritis. EXAM: MRA HEAD WITHOUT AND WITH CONTRAST TECHNIQUE: Multiplanar, multi-echo pulse sequences of the brain and surrounding structures were acquired without intravenous contrast. Angiographic images of the Circle of Willis were acquired using MRA technique without intravenous contrast. COMPARISON:  Brain MRI 08/07/2017. FINDINGS: Anterior circulation: Antegrade flow in both ICA siphons. No siphon stenosis. Patent ophthalmic artery origins. Patent carotid termini. Patent MCA and ACA origins. Non dominant right A1. Anterior communicating artery and visible bilateral ACA branches are within normal limits. Left MCA M1 segment, bifurcation, left MCA branches are within normal limits. Right MCA M1 segment, trifurcation, visible right MCA branches are within normal limits. Posterior circulation: Pre contrast images demonstrate antegrade flow signal in codominant distal vertebral arteries, vertebrobasilar junction, basilar artery. SCA, AICA, left PICA origins appear normal. Left  PCA is within normal limits. Right PCA is occluded at the P1, P2 junction (series 2, image 85). Posterior communicating arteries are diminutive or absent. Anatomic variants: Dominant left ACA A1 segment. Other findings: No evidence of intracranial mass effect or ventriculomegaly. No encephalomalacia evident in the right PCA territory. Following contrast the visible major dural venous sinuses also appear to be patent. Bilateral temporal artery region neurovascular bundles appear symmetric. No abnormal intracranial enhancement identified. IMPRESSION: 1. 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 This is age indeterminate. No obvious cerebral edema or encephalomalacia in that vascular territory. 2. Otherwise negative for age intracranial MRA. 3. Temporal artery biopsy (TAB) is the gold standard in diagnosing giant cell arteritis. Electronically Signed   By: Marlise Simpers M.D.   On: 05/08/2023 12:52        Scheduled Meds:  aspirin  EC  81 mg Oral Daily   heparin   5,000 Units Subcutaneous Q8H   pantoprazole   40 mg Oral Daily   [START ON 05/11/2023] predniSONE   60 mg Oral Q breakfast   sodium chloride  flush  3 mL Intravenous Q12H   sodium chloride  flush  3-10 mL Intravenous Q12H   Continuous Infusions:  methylPREDNISolone  (SOLU-MEDROL ) injection 500 mg (05/08/23 2321)     LOS: 1 day       Seena Dadds, MD [facility]   To contact the attending provider between 7A-7P or the covering provider during after hours 7P-7A, please log into the web site [email or web address] and access using universal [place] password for that web site. If you do not have the password, please call the hospital operator.  05/09/2023, 2:06 PM

## 2023-05-09 NOTE — Progress Notes (Signed)
 [facility]  Subjective  -left eye vision loss   Objective (!) 153/60 74 97.8 F (36.6 C) (Temporal) 20 98%  Intake/Output Summary (Last 24 hours) at 05/09/2023 0736 Last data filed at 05/08/2023 2251 Gross per 24 hour  Intake 258.74 ml  Output --  Net 258.74 ml    Left temporal pulse palpable  Laboratory Lab Results: Recent Labs    05/08/23 1135 05/09/23 0532  WBC 15.7* 11.1*  HGB 11.4* 11.4*  HCT 35.7* 34.6*  PLT 452* 426*   BMET Recent Labs    05/08/23 1135 05/09/23 0532  NA 136 136  K 2.9* 4.3  CL 97* 100  CO2 24 23  GLUCOSE 120* 180*  BUN 11 11  CREATININE 0.79 0.57  CALCIUM 8.3* 8.5*    COAG Lab Results  Component Value Date   INR 1.3 (H) 05/08/2023   INR 0.97 09/15/2015   INR 1.05 01/24/2015   No results found for: "PTT"  Assessment/Planning:  Discussed plan for left temporal artery biopsy.  Risk benefits discussed and questions answered.  Discussed risk of injury to the temporal branch of the facial nerve.  Young Hensen 05/09/2023 7:36 AM --

## 2023-05-09 NOTE — Op Note (Signed)
 Date: May 09, 2023  Preoperative diagnosis: Concern for temporal arteritis  Postoperative diagnosis: Same  Procedure: Left temporal artery biopsy  Surgeon: Dr. Young Hensen, MD  Assistant: OR staff  Indications: 89 year old female the vascular surgery was consulted for left temporal artery biopsy at the request of neurology.  Patient presents for left temporal artery biopsy after risks benefits discussed.  Anesthesia: MAC with local  Findings: Two samples of temporal artery were sent to pathology.  Initially, I found a small temporal artery specimen and on further dissection I found a larger main branch.  Details: Patient was taken to the operating room after informed consent was obtained.  Placed on the operating room table supine position.  After anesthesia was induced, I used a pencil Doppler to mark out the course of her temporal artery over the left temporal region.  I did shave a little bit of hair.  This area was prepped and draped in standard sterile fashion.  Antibiotics were given and timeout performed.  I then used 1% lidocaine  without epinephrine  injected about 5 mL of local.  I made an incision here with a 15 blade scalpel.  Dissected down Bovie cautery.  I used Farr spring retractors retractors.  Ultimately initially found a small temporal artery in the wound bed and this was dissected out with Metzenbaum scissors.  The artery was very diseased and quite fragile.  Upon further dissection I traced this back to a larger main temporal artery.  I elected to send both samples.  The initial smaller branch of temporal artery was ligated over right angle clamps divided and sent to pathology in a specimen cup.  I ligated both right angle clamps with 2-0 silk ties.  I then finished dissecting out the larger temporal artery branch and then ligated this over right angle clamps as well.  The specimen was about 2 to 3 cm in length.  It was also sent in a separate specimen cup.  I ligated the  stumps of the temporal artery with 2-0 silk ties.  The wound was irrigated.  It was closed with 3-0 Vicryl 4-0 Monocryl and Dermabond.  Complication: None  Condition: Stable  Young Hensen, MD [facility] Office: [phone]   Young Hensen

## 2023-05-10 ENCOUNTER — Inpatient Hospital Stay (HOSPITAL_COMMUNITY)

## 2023-05-10 DIAGNOSIS — R7989 Other specified abnormal findings of blood chemistry: Secondary | ICD-10-CM

## 2023-05-10 DIAGNOSIS — M316 Other giant cell arteritis: Principal | ICD-10-CM

## 2023-05-10 DIAGNOSIS — H5462 Unqualified visual loss, left eye, normal vision right eye: Secondary | ICD-10-CM | POA: Diagnosis not present

## 2023-05-10 DIAGNOSIS — I2699 Other pulmonary embolism without acute cor pulmonale: Secondary | ICD-10-CM | POA: Diagnosis not present

## 2023-05-10 LAB — COMPREHENSIVE METABOLIC PANEL WITH GFR
ALT: 81 U/L — ABNORMAL HIGH (ref 0–44)
AST: 120 U/L — ABNORMAL HIGH (ref 15–41)
Albumin: 1.9 g/dL — ABNORMAL LOW (ref 3.5–5.0)
Alkaline Phosphatase: 194 U/L — ABNORMAL HIGH (ref 38–126)
Anion gap: 10 (ref 5–15)
BUN: 14 mg/dL (ref 8–23)
CO2: 24 mmol/L (ref 22–32)
Calcium: 8.7 mg/dL — ABNORMAL LOW (ref 8.9–10.3)
Chloride: 104 mmol/L (ref 98–111)
Creatinine, Ser: 0.6 mg/dL (ref 0.44–1.00)
GFR, Estimated: >60 mL/min (ref >60)
Glucose, Bld: 173 mg/dL — ABNORMAL HIGH (ref 70–99)
Potassium: 4.3 mmol/L (ref 3.5–5.1)
Sodium: 138 mmol/L (ref 135–145)
Total Bilirubin: 0.5 mg/dL (ref 0.0–1.2)
Total Protein: 6.1 g/dL — ABNORMAL LOW (ref 6.5–8.1)

## 2023-05-10 LAB — CBC
HCT: 33.6 % — ABNORMAL LOW (ref 36.0–46.0)
Hemoglobin: 10.8 g/dL — ABNORMAL LOW (ref 12.0–15.0)
MCH: 26.8 pg (ref 26.0–34.0)
MCHC: 32.1 g/dL (ref 30.0–36.0)
MCV: 83.4 fL (ref 80.0–100.0)
Platelets: 481 10*3/uL — ABNORMAL HIGH (ref 150–400)
RBC: 4.03 MIL/uL (ref 3.87–5.11)
RDW: 14 % (ref 11.5–15.5)
WBC: 20.2 10*3/uL — ABNORMAL HIGH (ref 4.0–10.5)
nRBC: 0 % (ref 0.0–0.2)

## 2023-05-10 LAB — GLUCOSE, CAPILLARY
Glucose-Capillary: 169 mg/dL — ABNORMAL HIGH (ref 70–99)
Glucose-Capillary: 251 mg/dL — ABNORMAL HIGH (ref 70–99)
Glucose-Capillary: 138 mg/dL — ABNORMAL HIGH (ref 70–99)
Glucose-Capillary: 208 mg/dL — ABNORMAL HIGH (ref 70–99)

## 2023-05-10 LAB — ANA W/REFLEX IF POSITIVE: Anti Nuclear Antibody (ANA): NEGATIVE

## 2023-05-10 LAB — HEPARIN LEVEL (UNFRACTIONATED)
Heparin Unfractionated: 0.2 [IU]/mL — ABNORMAL LOW (ref 0.30–0.70)
Heparin Unfractionated: >1.1 [IU]/mL — ABNORMAL HIGH (ref 0.30–0.70)
Heparin Unfractionated: >1.1 [IU]/mL — ABNORMAL HIGH (ref 0.30–0.70)

## 2023-05-10 LAB — C-REACTIVE PROTEIN: CRP: 14.7 mg/dL — ABNORMAL HIGH (ref <1.0)

## 2023-05-10 LAB — PHOSPHORUS: Phosphorus: 2.5 mg/dL (ref 2.5–4.6)

## 2023-05-10 LAB — MAGNESIUM: Magnesium: 2.3 mg/dL (ref 1.7–2.4)

## 2023-05-10 MED ORDER — APIXABAN 5 MG PO TABS: 5.0000 mg | ORAL_TABLET | Freq: Two times a day (BID) | ORAL | Status: DC

## 2023-05-10 MED ORDER — DOCUSATE SODIUM 100 MG PO CAPS
100.0000 mg | ORAL_CAPSULE | Freq: Two times a day (BID) | ORAL | Status: DC
  Administered 2023-05-10 – 2023-05-12 (×5): 100 mg via ORAL
  Filled 2023-05-10 (×5): qty 1

## 2023-05-10 MED ORDER — LOSARTAN POTASSIUM 50 MG PO TABS
25.0000 mg | ORAL_TABLET | Freq: Every day | ORAL | Status: DC
  Administered 2023-05-10 – 2023-05-12 (×3): 25 mg via ORAL
  Filled 2023-05-10 (×3): qty 1

## 2023-05-10 MED ORDER — CARVEDILOL 6.25 MG PO TABS: 6.2500 mg | ORAL_TABLET | Freq: Two times a day (BID) | ORAL | Status: DC

## 2023-05-10 MED ORDER — HEPARIN (PORCINE) 25000 UT/250ML-% IV SOLN: 800.0000 [IU]/h | INTRAVENOUS | Status: DC

## 2023-05-10 MED ORDER — APIXABAN 5 MG PO TABS
10.0000 mg | ORAL_TABLET | Freq: Two times a day (BID) | ORAL | Status: DC
  Administered 2023-05-10 – 2023-05-12 (×4): 10 mg via ORAL
  Filled 2023-05-10 (×4): qty 2

## 2023-05-10 MED ORDER — LEVOTHYROXINE SODIUM 75 MCG PO TABS
37.5000 ug | ORAL_TABLET | ORAL | Status: DC
  Administered 2023-05-11: 37.5 ug via ORAL
  Filled 2023-05-10: qty 1

## 2023-05-10 MED ORDER — LEVOTHYROXINE SODIUM 75 MCG PO TABS
75.0000 ug | ORAL_TABLET | ORAL | Status: DC
  Administered 2023-05-12: 75 ug via ORAL
  Filled 2023-05-10: qty 1

## 2023-05-10 MED ORDER — CARVEDILOL 3.125 MG PO TABS
3.1250 mg | ORAL_TABLET | Freq: Two times a day (BID) | ORAL | Status: DC
  Administered 2023-05-10 – 2023-05-12 (×3): 3.125 mg via ORAL
  Filled 2023-05-10 (×4): qty 1

## 2023-05-10 NOTE — Progress Notes (Addendum)
 Pharmacy Consult for Heparin  Indication: pulmonary embolus  Allergies  Allergen Reactions   Ampicillin Cough    Has patient had a PCN reaction causing immediate rash, facial/tongue/throat swelling, SOB or lightheadedness with hypotension: No Has patient had a PCN reaction causing severe rash involving mucus membranes or skin necrosis: No Has patient had a PCN reaction that required hospitalization No Has patient had a PCN reaction occurring within the last 10 years: No If all of the above answers are "NO", then may proceed with Cephalosporin use.   Elastic Bandages & [Zinc]    Sulfa Antibiotics Cough   Latex Rash    Patient Measurements: Height: 4\' 9"  (144.8 cm) Weight: 63.8 kg (140 lb 10.5 oz) IBW/kg (Calculated) : 38.6 HEPARIN  DW (KG): 52.7  Vital Signs: Temp: 96.7 F (35.9 C) (05/03 1159) Temp Source: Oral (05/03 1159) BP: 131/47 (05/03 1153) Pulse Rate: 88 (05/03 1159)  Labs: Recent Labs    05/08/23 1135 05/09/23 0532 05/10/23 0214 05/10/23 1139 05/10/23 1342  HGB 11.4* 11.4* 10.8*  --   --   HCT 35.7* 34.6* 33.6*  --   --   PLT 452* 426* 481*  --   --   LABPROT 16.6*  --   --   --   --   INR 1.3*  --   --   --   --   HEPARINUNFRC  --   --  0.20* >1.10* >1.10*  CREATININE 0.79 0.57 0.60  --   --   CKTOTAL 21*  --   --   --   --     Estimated Creatinine Clearance: 38.1 mL/min (by C-G formula based on SCr of 0.6 mg/dL).  Assessment: 89 y.o. female with PE s/p L temporal artery biopsy. Pharmacy has been consulted for heparin  dosing- no bolus, targeting lower end of therapeutic goal at MD request iven proximity to temporal artery biopsy.   Heparin  level resulted > 1.10 on 900 units/hr. Level was drawn from hand of the arm heparin  is infusion. Ordered stat recheck from opposite side which also resulted > 1.10 (supratherapeutic). No issues with infusion or s/sx of bleeding per RN. CBC stable  Of note, previously subtherapeutic (HL 0.20) on 750 units/hr.  Goal of  Therapy:  Heparin  level 0.3-0.5 units/mL Monitor platelets by anticoagulation protocol: Yes   Plan:  Hold heparin  gtt for 30 minutes Restart heparin  gtt at 800 units/hr @ 1500 F/u 8hr HL Daily HL & CBC F/u long-term AC plan  Abelina Abide, PharmD PGY1 Pharmacy Resident 05/10/2023 2:22 PM  ADDENDUM: Received consult to transition to Eliquis. Okay with starting at 10mg  BID dose per MD. Will keep heparin  gtt off until Eliquis dose to allow for HL to drop closer to therapeutic range.  Abelina Abide, PharmD PGY1 Pharmacy Resident 05/10/2023 2:48 PM

## 2023-05-10 NOTE — Plan of Care (Signed)
   Problem: Clinical Measurements: Goal: Respiratory complications will improve Outcome: Progressing   Problem: Activity: Goal: Risk for activity intolerance will decrease Outcome: Progressing   Problem: Elimination: Goal: Will not experience complications related to bowel motility Outcome: Progressing

## 2023-05-10 NOTE — Progress Notes (Signed)
  Daily Progress Note  Left temporal incision clean dry and intact. Pathology pending   Philipp Brawn MD MS [facility] [phone] 05/10/2023  8:52 AM

## 2023-05-10 NOTE — Progress Notes (Addendum)
 PROGRESS NOTE    [name]  RUE:[id number] DOB: 1933/03/11 DOA: 05/08/2023 PCP: Jimmey Mould, MD  Chief Complaint  Patient presents with   Eye Problem    Brief Narrative:   [name] is a 89 y.o. 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  Inflammatory markers are elevated, would support this diagnosis.  Assessment & Plan:   Principal Problem:   Vision loss of left eye  Left eye vision loss Temporal arteritis -Presents with vision loss in left eye, significantly inflammatory markers, including CRP, ESR and D-dimers -High clinical concern for temporal arteritis. - Continue with high-dose IV steroids, 500 mg IV Solu-Medrol  x 3 days, then prolonged steroid taper. 2/2 temporal arteritis per ophthalmology.  -Will start on Protonix  for GI prophylaxis -Vascular surgery input greatly appreciated, status post left temporal artery biopsy.  Chronic systolic/diastolic CHF -The echo significant for EF 40%, with global hypokinesis, worse on the septum, and septal lateral desynchrony, assistant with left bundle branch block as well grade 1 diastolic dysfunction -she does not appear to be in any volume overload, no previous echo to compare -Continue to monitor on telemetry, will start on low-dose beta-blocker and start GDMT gradually if tolerates she is on low-dose Coreg, will add low-dose losartan as well. -She will need further workup by cardiology once more stable, likely as an outpatient. - As well there is evidence of three-vessel disease on her CTA chest, LDL is elevated, continue with aspirin , will start on low-dose statin whenever her generalized weakness and bodyaches improves  LBBB on EKG: -No chest pain, no  shortness of breath, troponins are negative x 2 - Please see above discussion   Elevated D-dimers Bilateral DVT and PE -Started on heparin  GTT, no evidence of right heart strain on echo or CTA chest, will transition to Eliquis   hypothyroidism -Continue with home dose Synthroid  -Both TSH, and free T4 is elevated, so I will keep current dose as these lab abnormalities most likely in the setting of sick euthyroid syndrome.   Transaminitis -most likely in the setting of inflammatory response as all inflammatory markers are elevated, right upper quadrant ultrasound, with no acute findings, hepatitis panel was negative  Dysphagia Weight loss - CT neck, chest, abdomen pelvis obtained to rule out malignancy, main significant finding is bilateral ovarian cysts, discussed with patient, daughter at bedside, she will follow-up with GYN as an outpatient regarding that.  Failure to thrive - Significant weight loss, weakness, deconditioning, movement significantly low at 2, will consult nutritionist and PT/OT  Insomnia, poor appetite, depression - Will start on mirtazapine  DVT prophylaxis: Subcu heparin  Code Status: Full code Family Communication: Discussed with daughter at bedside Disposition:   Status is: Inpatient    Consultants:  Neurology vascular   Sbjective:  Patient reports fatigue, weakness has improved, she still reports poor appetite and poor night sleep Objective: Vitals:   05/10/23 1100 05/10/23 1152 05/10/23 1153 05/10/23 1159  BP:  (!) 157/144 (!) 131/47   Pulse: 95   88  Resp: 18   (!) 21  Temp:    (!) 96.7 F (35.9 C)  TempSrc:    Oral  SpO2: 94%   94%  Weight:      Height:        Intake/Output Summary (Last 24  hours) at 05/10/2023 1422 Last data filed at 05/10/2023 1610 Gross per 24 hour  Intake 1355.33 ml  Output --  Net 1355.33 ml   Filed Weights   05/08/23 1105 05/09/23 0433 05/10/23 0425  Weight: 63 kg 61.2 kg 63.8 kg    Examination:  Awake  Alert, Oriented X 3, No new F.N deficits, Normal affect Symmetrical Chest wall movement, Good air movement bilaterally, CTAB RRR,No Gallops,Rubs or new Murmurs, No Parasternal Heave +ve B.Sounds, Abd Soft, No tenderness, No rebound - guarding or rigidity. No Cyanosis, Clubbing or edema, No new Rash or bruise       Data Reviewed: I have personally reviewed following labs and imaging studies  CBC: Recent Labs  Lab 05/08/23 1135 05/09/23 0532 05/10/23 0214  WBC 15.7* 11.1* 20.2*  NEUTROABS 12.8*  --   --   HGB 11.4* 11.4* 10.8*  HCT 35.7* 34.6* 33.6*  MCV 85.6 83.2 83.4  PLT 452* 426* 481*    Basic Metabolic Panel: Recent Labs  Lab 05/08/23 1135 05/08/23 1300 05/09/23 0532 05/10/23 0214  NA 136  --  136 138  K 2.9*  --  4.3 4.3  CL 97*  --  100 104  CO2 24  --  23 24  GLUCOSE 120*  --  180* 173*  BUN 11  --  11 14  CREATININE 0.79  --  0.57 0.60  CALCIUM 8.3*  --  8.5* 8.7*  MG  --  2.0  --  2.3  PHOS 3.2  --   --  2.5    GFR: Estimated Creatinine Clearance: 38.1 mL/min (by C-G formula based on SCr of 0.6 mg/dL).  Liver Function Tests: Recent Labs  Lab 05/08/23 1135 05/09/23 0532 05/10/23 0214  AST 174* 173* 120*  ALT 109* 111* 81*  ALKPHOS 177* 221* 194*  BILITOT 1.0 0.8 0.5  PROT 6.6 6.8 6.1*  ALBUMIN 2.1* 2.0* 1.9*    CBG: Recent Labs  Lab 05/09/23 1652 05/09/23 2038 05/10/23 0937 05/10/23 1154  GLUCAP 275* 182* 169* 251*     Recent Results (from the past 240 hours)  Resp panel by RT-PCR (RSV, Flu A&B, Covid) Anterior Nasal Swab     Status: None   Collection Time: 05/08/23  1:29 PM   Specimen: Anterior Nasal Swab  Result Value Ref Range Status   SARS Coronavirus 2 by RT PCR NEGATIVE NEGATIVE Final   Influenza A by PCR NEGATIVE NEGATIVE Final   Influenza B by PCR NEGATIVE NEGATIVE Final    Comment: (NOTE) The Xpert Xpress SARS-CoV-2/FLU/RSV plus assay is intended as an aid in the diagnosis of influenza from Nasopharyngeal swab specimens  and should not be used as a sole basis for treatment. Nasal washings and aspirates are unacceptable for Xpert Xpress SARS-CoV-2/FLU/RSV testing.  Fact Sheet for Patients: BloggerCourse.com  Fact Sheet for Healthcare Providers: SeriousBroker.it  This test is not yet approved or cleared by the [place]  FDA and has been authorized for detection and/or diagnosis of SARS-CoV-2 by FDA under an Emergency Use Authorization (EUA). This EUA will remain in effect (meaning this test can be used) for the duration of the COVID-19 declaration under Section 564(b)(1) of the Act, 21 U.S.C. section 360bbb-3(b)(1), unless the authorization is terminated or revoked.     Resp Syncytial Virus by PCR NEGATIVE NEGATIVE Final    Comment: (NOTE) Fact Sheet for Patients: BloggerCourse.com  Fact Sheet for Healthcare Providers: SeriousBroker.it  This test is not yet approved or cleared by the [place]  FDA  and has been authorized for detection and/or diagnosis of SARS-CoV-2 by FDA under an Emergency Use Authorization (EUA). This EUA will remain in effect (meaning this test can be used) for the duration of the COVID-19 declaration under Section 564(b)(1) of the Act, 21 U.S.C. section 360bbb-3(b)(1), unless the authorization is terminated or revoked.  Performed at [facility], [address], [place], [place] [address]          Radiology Studies: VAS US  LOWER EXTREMITY VENOUS (DVT) Result Date: 05/10/2023  Lower Venous DVT Study Patient Name:  [name]  Date of Exam:   05/10/2023 Medical Rec #: [id number]           Accession #:    [id number] Date of Birth: 07-18-1933           Patient Gender: F Patient Age:   62 years Exam Location:  [facility] Procedure:      VAS US  LOWER EXTREMITY VENOUS (DVT) Referring Phys: Seena Dadds  --------------------------------------------------------------------------------  Indications: Pulmonary embolism.  Limitations: Pain and guarding. Comparison Study: 01/05/2016 Lower extremity venous duplex Performing Technologist: Delford Felling MHA, RDMS, RVT, RDCS  Examination Guidelines: A complete evaluation includes B-mode imaging, spectral Doppler, color Doppler, and power Doppler as needed of all accessible portions of each vessel. Bilateral testing is considered an integral part of a complete examination. Limited examinations for reoccurring indications may be performed as noted. The reflux portion of the exam is performed with the patient in reverse Trendelenburg.  +---------+---------------+---------+-----------+----------+--------------+ RIGHT    CompressibilityPhasicitySpontaneityPropertiesThrombus Aging +---------+---------------+---------+-----------+----------+--------------+ CFV      Full           Yes      Yes                                 +---------+---------------+---------+-----------+----------+--------------+ SFJ      Full                                                        +---------+---------------+---------+-----------+----------+--------------+ FV Prox  Full                                                        +---------+---------------+---------+-----------+----------+--------------+ FV Mid   Full           Yes      Yes                                 +---------+---------------+---------+-----------+----------+--------------+ FV DistalFull                                                        +---------+---------------+---------+-----------+----------+--------------+ PFV      Full                                                        +---------+---------------+---------+-----------+----------+--------------+  POP      Full           Yes      Yes                                  +---------+---------------+---------+-----------+----------+--------------+ PTV      None                    No                   Acute          +---------+---------------+---------+-----------+----------+--------------+ PERO     None                    No                   Acute          +---------+---------------+---------+-----------+----------+--------------+   +---------+---------------+---------+-----------+----------+--------------+ LEFT     CompressibilityPhasicitySpontaneityPropertiesThrombus Aging +---------+---------------+---------+-----------+----------+--------------+ CFV      Full           Yes      Yes                                 +---------+---------------+---------+-----------+----------+--------------+ SFJ      Full                                                        +---------+---------------+---------+-----------+----------+--------------+ FV Prox  Full                                                        +---------+---------------+---------+-----------+----------+--------------+ FV Mid   Full           Yes      Yes                                 +---------+---------------+---------+-----------+----------+--------------+ FV DistalFull                                                        +---------+---------------+---------+-----------+----------+--------------+ PFV      Full                                                        +---------+---------------+---------+-----------+----------+--------------+ POP      Full           Yes      Yes                                 +---------+---------------+---------+-----------+----------+--------------+ PTV      None  No                   Acute          +---------+---------------+---------+-----------+----------+--------------+ PERO     None                    No                   Acute           +---------+---------------+---------+-----------+----------+--------------+     Summary: RIGHT: - Findings consistent with acute deep vein thrombosis involving the right posterior tibial veins, and right peroneal veins.  - No cystic structure found in the popliteal fossa.  LEFT: - Findings consistent with acute deep vein thrombosis involving the left peroneal veins, and left posterior tibial veins.  - No cystic structure found in the popliteal fossa.  *See table(s) above for measurements and observations. Electronically signed by Delaney Fearing on 05/10/2023 at 11:38:56 AM.    Final    CT SOFT TISSUE NECK W CONTRAST Result Date: 05/09/2023 CLINICAL DATA:  Difficulty swallowing. Enlarged and painful cervical lymph noted. EXAM: CT NECK WITH CONTRAST TECHNIQUE: Multidetector CT imaging of the neck was performed using the standard protocol following the bolus administration of intravenous contrast. RADIATION DOSE REDUCTION: This exam was performed according to the departmental dose-optimization program which includes automated exposure control, adjustment of the mA and/or kV according to patient size and/or use of iterative reconstruction technique. CONTRAST:  75mL OMNIPAQUE IOHEXOL 350 MG/ML SOLN COMPARISON:  None available FINDINGS: Pharynx and larynx: No focal mucosal or submucosal lesions are present. The nasopharynx is clear. The soft palate and tongue base are normal. Oropharynx is unremarkable. Vallecula and epiglottis are within normal limits. Aryepiglottic folds and piriform sinuses are clear. Vocal cords are midline and symmetric. Trachea is clear. Salivary glands: The submandibular and parotid glands and ducts are within normal limits. Thyroid : Normal Lymph nodes: Normal Vascular: Atherosclerotic calcifications are present at the carotid bifurcations bilaterally. No significant stenosis is present on the right. The left ICA lumen narrowed to 2.4 mm this compares to more distal measurement of 4.8 mm. Limited  intracranial: Within normal limits. Visualized orbits: Bilateral lens replacements are noted. Globes and orbits are otherwise unremarkable. Mastoids and visualized paranasal sinuses: The paranasal sinuses and mastoid air cells are clear. Skeleton: Ankylosis is present cross the disc space at C5-6. Posterior elements are fused at C4-5. Adjacent level disease is present C5-6 with facet spurring bilaterally. Mild left foraminal stenosis is present. No focal osseous lesions are present. Upper chest: The lung apices are clear. The thoracic inlet is within normal limits. IMPRESSION: 1. No acute or focal lesion to explain the patient's symptoms. 2. No adenopathy or mass. 3. Atherosclerotic calcifications at the carotid bifurcations bilaterally. 4. Ankylosis across the disc space at C5-6. 5. Posterior elements are fused at C4-5. 6. Adjacent level disease at C5-6 with facet spurring bilaterally. Mild left foraminal stenosis is present. Electronically Signed   By: Audree Leas M.D.   On: 05/09/2023 18:17   CT ABDOMEN PELVIS W CONTRAST Result Date: 05/09/2023 CLINICAL DATA:  weight loss, elevated D Dimer, difficulty swallowing, rule out , malignancy , PE EXAM: CT ABDOMEN AND PELVIS WITH CONTRAST TECHNIQUE: Multidetector CT imaging of the abdomen and pelvis was performed using the standard protocol following bolus administration of intravenous contrast. RADIATION DOSE REDUCTION: This exam was performed according to the departmental dose-optimization program which includes automated exposure control, adjustment of the mA  and/or kV according to patient size and/or use of iterative reconstruction technique. CONTRAST:  75mL OMNIPAQUE IOHEXOL 350 MG/ML SOLN COMPARISON:  None Available. FINDINGS: Lower chest: See chest CT report today. Hepatobiliary: No focal liver abnormality is seen. Status post cholecystectomy. No biliary dilatation. Pancreas: No focal abnormality or ductal dilatation. Spleen: No focal abnormality.   Normal size. Adrenals/Urinary Tract: No suspicious renal or adrenal abnormality/lesion. No stones or hydronephrosis. Urinary bladder unremarkable. Stomach/Bowel: Sigmoid diverticulosis. No active diverticulitis. Stomach and small bowel decompressed. No bowel obstruction or inflammatory process. Vascular/Lymphatic: Aortic atherosclerosis. No evidence of aneurysm or adenopathy. Reproductive: Calcifications in the uterus, likely small calcified fibroids. 4.7 cm right ovarian cyst. 1.2 cm left ovarian cyst. Other: No free fluid or free air. Musculoskeletal: No acute bony abnormality. IMPRESSION: No acute findings in the abdomen or pelvis. Sigmoid diverticulosis.  No active diverticulitis. Bilateral ovarian cysts, the largest 4.7 cm in the right ovary. Recommend follow-up US  in 6-12 months. Note: This recommendation does not apply to premenarchal patients and to those with increased risk (genetic, family history, elevated tumor markers or other high-risk factors) of ovarian cancer. Reference: JACR 2020 Feb; 17(2):248-254 Aortic atherosclerosis. Electronically Signed   By: Janeece Mechanic M.D.   On: 05/09/2023 17:44   CT Angio Chest Pulmonary Embolism (PE) W or WO Contrast Result Date: 05/09/2023 CLINICAL DATA:  weight loss, elevated D Dimer, difficulty swallowing, rule out malignancy , PE EXAM: CT ANGIOGRAPHY CHEST WITH CONTRAST TECHNIQUE: Multidetector CT imaging of the chest was performed using the standard protocol during bolus administration of intravenous contrast. Multiplanar CT image reconstructions and MIPs were obtained to evaluate the vascular anatomy. RADIATION DOSE REDUCTION: This exam was performed according to the departmental dose-optimization program which includes automated exposure control, adjustment of the mA and/or kV according to patient size and/or use of iterative reconstruction technique. CONTRAST:  75mL OMNIPAQUE IOHEXOL 350 MG/ML SOLN COMPARISON:  None Available. FINDINGS: Cardiovascular:  Filling defects in the posterior right lower lobe pulmonary arterial branches compatible with pulmonary emboli. No evidence of right heart strain. Mild cardiomegaly. Three-vessel coronary artery disease. Moderate aortic atherosclerosis. No evidence of aortic aneurysm. Mediastinum/Nodes: No mediastinal, hilar, or axillary adenopathy. Trachea and esophagus are unremarkable. Thyroid  unremarkable. Lungs/Pleura: Dependent and bibasilar atelectasis. Trace bilateral pleural effusions. Upper Abdomen: See abdominal CT report Musculoskeletal: Chest wall soft tissues are unremarkable. No acute bony abnormality. Diffuse degenerative changes throughout the thoracic spine. Review of the MIP images confirms the above findings. IMPRESSION: Posterior right lower lobe segmental pulmonary emboli. No evidence of right heart strain. Cardiomegaly, three-vessel coronary artery disease. Dependent and bibasilar atelectasis. Trace bilateral pleural effusions. Aortic Atherosclerosis (ICD10-I70.0). These results will be called to the ordering clinician or representative by the Radiologist Assistant, and communication documented in the PACS or [employer]. Electronically Signed   By: Janeece Mechanic M.D.   On: 05/09/2023 17:41   MR BRAIN WO CONTRAST Result Date: 05/08/2023 CLINICAL DATA:  Initial evaluation for possible PCA infarct. EXAM: MRI HEAD WITHOUT CONTRAST TECHNIQUE: Multiplanar, multiecho pulse sequences of the brain and surrounding structures were obtained without intravenous contrast. COMPARISON:  Prior MRA from earlier the same day as well as prior MRI from 08/07/2017. FINDINGS: Brain: Cerebral volume within normal limits for age. Scattered patchy T2/FLAIR hyperintensity involving the periventricular deep white matter both cerebral hemispheres, most characteristic of chronic microvascular ischemic disease. No abnormal foci of restricted diffusion to suggest acute or subacute ischemia. No areas of chronic cortical infarction.  Apparent diffuse FLAIR hyperintensity seen throughout the subarachnoid  spaces on FLAIR sequence noted (series 11, image 25 for example), favored to be artifactual as no signal changes are seen on corresponding sequences. No associated susceptibility artifact to suggest hemorrhage. No other acute or chronic intracranial blood products. No mass lesion, midline shift or mass effect. No hydrocephalus or extra-axial fluid collection. Pituitary gland within normal limits. Vascular: Major intracranial vascular flow voids are maintained by MRI. Skull and upper cervical spine: Degenerative thickening about the tectorial membrane with mild stenosis at the cervicomedullary junction (series 9, image 12). Bone marrow signal intensity within normal limits. No scalp soft tissue abnormality. Sinuses/Orbits: Prior bilateral ocular lens replacement. Paranasal sinuses are largely clear. Trace bilateral mastoid effusions noted, bowel significance. Other: None. IMPRESSION: 1. Apparent diffusely increased FLAIR hyperintensity throughout the subarachnoid spaces as above, felt to be most consistent with artifact on this exam. Sequelae of meningitis would be the primary differential consideration, although again, this is strongly favored to be artifactual in nature. 2. No other acute intracranial abnormality. No evidence for acute or chronic right PCA distribution infarct. 3. Mild chronic microvascular ischemic disease for age. Electronically Signed   By: Virgia Griffins M.D.   On: 05/08/2023 21:52   US  Abdomen Limited RUQ (LIVER/GB) Result Date: 05/08/2023 EXAM: Right Upper Quadrant Abdominal Ultrasound TECHNIQUE: Real-time ultrasonography of the right upper quadrant of the abdomen was performed. COMPARISON: None. CLINICAL HISTORY: Abnormal LFTs FINDINGS: LIVER: The liver demonstrates normal echogenicity. No evidence of intrahepatic biliary ductal dilatation. No evidence of mass. BILIARY SYSTEM: Status post cholecystectomy. Common  bile duct measures 8 mm, within the upper limits of normal. OTHER: No evidence of right upper quadrant ascites. IMPRESSION: 1. Status post cholecystectomy. 2. Otherwise unremarkable. Electronically signed by: Zadie Herter MD 05/08/2023 08:57 PM EDT RP Workstation: [id number]   ECHOCARDIOGRAM COMPLETE BUBBLE STUDY Result Date: 05/08/2023    ECHOCARDIOGRAM REPORT   Patient Name:   [name] Date of Exam: 05/08/2023 Medical Rec #:  [id number]          Height:       57.0 in Accession #:    [id number]         Weight:       139.0 lb Date of Birth:  [birth year]          BSA:          1.541 m Patient Age:    89 years           BP:           133/54 mmHg Patient Gender: F                  HR:           76 bpm. Exam Location:  Inpatient Procedure: 2D Echo, Cardiac Doppler, Color Doppler and Saline Contrast Bubble            Study (Both Spectral and Color Flow Doppler were utilized during            procedure). Indications:    Stroke 434.91 / I63.9  History:        Patient has no prior history of Echocardiogram examinations.                 Risk Factors:Dyslipidemia.  Sonographer:    Terrilee Few RCS Referring Phys: [phone] EKTA V PATEL IMPRESSIONS  1. Normal left ventricular size. Left ventricular ejection fraction, by estimation, is 40%. The left ventricle has mild to moderately decreased function. The left ventricle demonstrates global hypokinesis  worse in the septum, septal-lateral dyssynchrony  consistent with LBBB. Left ventricular diastolic parameters are consistent with Grade I diastolic dysfunction (impaired relaxation).  2. Right ventricular systolic function is normal. The right ventricular size is normal. There is normal pulmonary artery systolic pressure. The estimated right ventricular systolic pressure is 16.0 mmHg.  3. Bubble study negative, no PFO or ASD.  4. The mitral valve is degenerative. Mild mitral valve regurgitation. No evidence of mitral stenosis. Moderate mitral annular calcification.   5. The aortic valve is tricuspid. Aortic valve regurgitation is mild. No aortic stenosis is present.  6. The inferior vena cava is normal in size with greater than 50% respiratory variability, suggesting right atrial pressure of 3 mmHg. FINDINGS  Left Ventricle: Left ventricular ejection fraction, by estimation, is 40%. The left ventricle has mild to moderately decreased function. The left ventricle demonstrates global hypokinesis. The left ventricular internal cavity size was normal in size. There is no left ventricular hypertrophy. Left ventricular diastolic parameters are consistent with Grade I diastolic dysfunction (impaired relaxation). Right Ventricle: The right ventricular size is normal. No increase in right ventricular wall thickness. Right ventricular systolic function is normal. There is normal pulmonary artery systolic pressure. The tricuspid regurgitant velocity is 1.80 m/s, and  with an assumed right atrial pressure of 3 mmHg, the estimated right ventricular systolic pressure is 16.0 mmHg. Left Atrium: Left atrial size was normal in size. Right Atrium: Right atrial size was normal in size. Pericardium: There is no evidence of pericardial effusion. Mitral Valve: The mitral valve is degenerative in appearance. There is mild calcification of the mitral valve leaflet(s). Moderate mitral annular calcification. Mild mitral valve regurgitation. No evidence of mitral valve stenosis. Tricuspid Valve: The tricuspid valve is normal in structure. Tricuspid valve regurgitation is trivial. Aortic Valve: The aortic valve is tricuspid. Aortic valve regurgitation is mild. Aortic regurgitation PHT measures 597 msec. No aortic stenosis is present. Aortic valve peak gradient measures 5.7 mmHg. Pulmonic Valve: The pulmonic valve was normal in structure. Pulmonic valve regurgitation is not visualized. Aorta: The aortic root is normal in size and structure. Venous: The inferior vena cava is normal in size with greater than  50% respiratory variability, suggesting right atrial pressure of 3 mmHg. IAS/Shunts: Bubble study negative, no PFO or ASD. Agitated saline contrast was given intravenously to evaluate for intracardiac shunting.  LEFT VENTRICLE PLAX 2D LVIDd:         4.10 cm   Diastology LVIDs:         2.90 cm   LV e' medial:    6.20 cm/s LV PW:         1.00 cm   LV E/e' medial:  14.2 LV IVS:        0.60 cm   LV e' lateral:   5.22 cm/s LVOT diam:     2.00 cm   LV E/e' lateral: 16.9 LV SV:         52 LV SV Index:   34 LVOT Area:     3.14 cm  RIGHT VENTRICLE             IVC RV S prime:     10.40 cm/s  IVC diam: 1.40 cm TAPSE (M-mode): 2.0 cm LEFT ATRIUM           Index        RIGHT ATRIUM           Index LA diam:      3.00 cm 1.95 cm/m  RA Area:     10.20 cm LA Vol (A2C): 37.8 ml 24.53 ml/m  RA Volume:   20.40 ml  13.24 ml/m LA Vol (A4C): 24.7 ml 16.03 ml/m  AORTIC VALVE AV Area (Vmax): 2.31 cm AV Vmax:        119.00 cm/s AV Peak Grad:   5.7 mmHg LVOT Vmax:      87.60 cm/s LVOT Vmean:     55.400 cm/s LVOT VTI:       0.165 m AI PHT:         597 msec  AORTA Ao Root diam: 2.50 cm Ao Asc diam:  3.30 cm MITRAL VALVE                TRICUSPID VALVE MV Area (PHT): 3.85 cm     TR Peak grad:   13.0 mmHg MV Decel Time: 197 msec     TR Vmax:        180.00 cm/s MR Peak grad: 24.4 mmHg MR Vmax:      247.00 cm/s   SHUNTS MV E velocity: 88.00 cm/s   Systemic VTI:  0.16 m MV A velocity: 141.00 cm/s  Systemic Diam: 2.00 cm MV E/A ratio:  0.62 Dalton McleanMD Electronically signed by Archer Bear Signature Date/Time: 05/08/2023/4:28:30 PM    Final         Scheduled Meds:  aspirin  EC  81 mg Oral Daily   carvedilol  3.125 mg Oral BID WC   cholecalciferol   2,000 Units Oral QHS   docusate sodium   100 mg Oral BID   feeding supplement (GLUCERNA SHAKE)  237 mL Oral TID BM   insulin  aspart  0-9 Units Subcutaneous TID WC   [START ON 05/12/2023] levothyroxine   75 mcg Oral Once per day on Monday Tuesday Wednesday Thursday Friday Saturday    And   [START ON 05/11/2023] levothyroxine   37.5 mcg Oral Every Sunday   losartan  25 mg Oral Daily   magic mouthwash  5 mL Oral QID   magnesium  oxide  400 mg Oral QHS   multivitamin with minerals  1 tablet Oral QHS   pantoprazole   40 mg Oral Daily   [START ON 05/11/2023] predniSONE   60 mg Oral Q breakfast   sodium chloride  flush  3 mL Intravenous Q12H   sodium chloride  flush  3-10 mL Intravenous Q12H   Continuous Infusions:  heparin      lactated ringers  Stopped (05/10/23 1348)     LOS: 2 days       Seena Dadds, MD [facility]   To contact the attending provider between 7A-7P or the covering provider during after hours 7P-7A, please log into the web site [email or web address] and access using universal [place] password for that web site. If you do not have the password, please call the hospital operator.  05/10/2023, 2:22 PM

## 2023-05-10 NOTE — Evaluation (Signed)
 Physical Therapy Evaluation Patient Details Name: [name] MRN: [id number] DOB: [birth year] Today's Date: 05/10/2023  History of Present Illness  89 y.o. 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 Found +PE. PMH: allergies to ampicillin, adhesives elastic bandages sulfa antibiotics and latex, prediabetes, hypothyroidism, essential hypertension, history of shingles.   Clinical Impression  Pt admitted with above diagnosis. Previously independent with intermittent use of a cane, however has gotten weaker over the past couple of weeks and using RW at home now. Daughter has been staying with Pt and plans to continue to provide care after d/c. Able to transfer with min assist, and ambulate at [facility] today. Using RW for stability. Encouraged OOB with staff often during admission. Pt currently with functional limitations due to the deficits listed below (see PT Problem List). Pt will benefit from acute skilled PT to increase their independence and safety with mobility to allow discharge.           If plan is discharge home, recommend the following: A little help with walking and/or transfers;A little help with bathing/dressing/bathroom;Assistance with cooking/housework;Assist for transportation;Help with stairs or ramp for entrance   Can travel by private vehicle        Equipment Recommendations None recommended by PT  Recommendations for Other Services       Functional Status Assessment Patient has had a recent decline in their functional status and demonstrates the ability to make significant improvements in function in a reasonable and predictable amount of time.     Precautions / Restrictions Precautions Precautions: Fall Restrictions Weight Bearing Restrictions Per Provider Order: No      Mobility  Bed Mobility Overal bed mobility: Needs Assistance Bed Mobility: Supine to Sit, Sit to  Supine     Supine to sit: Contact guard Sit to supine: Min assist   General bed mobility comments: CGA for safety with transition to EOB, extra time but stable. Min assist for LEs back into bed.    Transfers Overall transfer level: Needs assistance Equipment used: Rolling walker (2 wheels), None Transfers: Sit to/from Stand Sit to Stand: Min assist, Contact guard assist           General transfer comment: Min assist for balance without assistive device. Practiced with RW and pt able to stabilize safely with CGA. Cues for technique    Ambulation/Gait Ambulation/Gait assistance: Contact guard assist Gait Distance (Feet): 90 Feet Assistive device: Rolling walker (2 wheels) Gait Pattern/deviations: Step-through pattern, Decreased stride length Gait velocity: dec Gait velocity interpretation: <1.8 ft/sec, indicate of risk for recurrent falls   General Gait Details: Educated on safe AD use with RW for support. Stable with device, cues for awareness and proximity to device to maximize support as needed. No buckling or overt LOB with this device.  Stairs            Wheelchair Mobility     Tilt Bed    Modified Rankin (Stroke Patients Only)       Balance Overall balance assessment: Needs assistance Sitting-balance support: No upper extremity supported, Feet supported Sitting balance-Leahy Scale: Fair     Standing balance support: Single extremity supported Standing balance-Leahy Scale: Poor                               Pertinent Vitals/Pain Pain Assessment Pain Assessment: Faces Faces Pain Scale: Hurts whole lot Pain  Location: bil temples Pain Descriptors / Indicators: Aching Pain Intervention(s): Monitored during session    Home Living Family/patient expects to be discharged to:: Private residence Living Arrangements: Alone (Daughter has been staying with pt.) Available Help at Discharge: Family;Available 24 hours/day Type of Home:  House Home Access: Stairs to enter Entrance Stairs-Rails:  (Center) Entrance Stairs-Number of Steps: 3   Home Layout: One level Home Equipment: Agricultural consultant (2 wheels);Cane - single point;Shower seat      Prior Function Prior Level of Function : Independent/Modified Independent;History of Falls (last six months)             Mobility Comments: one fall outside. SPC primary use. Recently using RW due to weakness. ADLs Comments: Able to bath/dress herself.     Extremity/Trunk Assessment   Upper Extremity Assessment Upper Extremity Assessment: Defer to OT evaluation    Lower Extremity Assessment Lower Extremity Assessment: Generalized weakness       Communication   Communication Communication: No apparent difficulties    Cognition Arousal: Alert Behavior During Therapy: WFL for tasks assessed/performed   PT - Cognitive impairments: No apparent impairments                         Following commands: Intact       Cueing Cueing Techniques: Verbal cues     General Comments General comments (skin integrity, edema, etc.): VSS throughout on RA, HR to 121 with gait.    Exercises     Assessment/Plan    PT Assessment Patient needs continued PT services  PT Problem List Decreased strength;Decreased range of motion;Decreased activity tolerance;Decreased balance;Decreased mobility;Decreased safety awareness;Decreased knowledge of precautions;Cardiopulmonary status limiting activity;Decreased knowledge of use of DME       PT Treatment Interventions DME instruction;Gait training;Stair training;Functional mobility training;Therapeutic activities;Therapeutic exercise;Balance training;Neuromuscular re-education;Patient/family education    PT Goals (Current goals can be found in the Care Plan section)  Acute Rehab PT Goals Patient Stated Goal: Go home PT Goal Formulation: With patient/family Time For Goal Achievement: 05/24/23 Potential to Achieve Goals:  Good    Frequency Min 2X/week     Co-evaluation               AM-PAC PT "6 Clicks" Mobility  Outcome Measure Help needed turning from your back to your side while in a flat bed without using bedrails?: None Help needed moving from lying on your back to sitting on the side of a flat bed without using bedrails?: A Little Help needed moving to and from a bed to a chair (including a wheelchair)?: A Little Help needed standing up from a chair using your arms (e.g., wheelchair or bedside chair)?: A Little Help needed to walk in hospital room?: A Little Help needed climbing 3-5 steps with a railing? : A Little 6 Click Score: 19    End of Session Equipment Utilized During Treatment: Gait belt Activity Tolerance: Patient tolerated treatment well Patient left: in bed;with call bell/phone within reach;with bed alarm set;with family/visitor present   PT Visit Diagnosis: Unsteadiness on feet (R26.81);Other abnormalities of gait and mobility (R26.89);Muscle weakness (generalized) (M62.81)    Time: 4540-9811 PT Time Calculation (min) (ACUTE ONLY): 34 min   Charges:   PT Evaluation $PT Eval Low Complexity: 1 Low PT Treatments $Gait Training: 8-22 mins PT General Charges $$ ACUTE PT VISIT: 1 Visit         Jory Ng, PT, DPT [facility]  Rehabilitation Services Physical Therapist Office: [phone] Website:  [email or web address]   Alinda Irani 05/10/2023, 12:18 PM

## 2023-05-10 NOTE — Progress Notes (Signed)
 NEUROLOGY CONSULT FOLLOW UP NOTE   Date of service: May 10, 2023 Patient Name: [name] MRN:  [id number] DOB:  05-24-1933  Interval Hx/subjective   Patient sleeping in the bed in no apparent distress.  Family is at the bedside Patient states that she still has no vision in her left eye. She has completed a 3 day course of high-dose IV steroids, which was tolerated well  Patient had left temporal artery biopsy yesterday. Pathology report is pending.  D-Dimer was elevated at > 20 and she was found the have posterior right lower lobe segmental pulmonary emboli on CTA chest. She is being started on IV heparin .  Vitals   Vitals:   05/10/23 1100 05/10/23 1152 05/10/23 1153 05/10/23 1159  BP:  (!) 157/144 (!) 131/47   Pulse: 95   88  Resp: 18   (!) 21  Temp:    (!) 96.7 F (35.9 C)  TempSrc:    Oral  SpO2: 94%   94%  Weight:      Height:         Body mass index is 30.44 kg/m.  Physical Exam   Constitutional: Appears well-developed and well-nourished.  Psych: Affect appropriate to situation.  Eyes: No scleral injection.  HENT: No OP obstrucion.  Head: Normocephalic. Temporal artery biopsy site is C/D/I Cardiovascular: Normal rate and regular rhythm.  Respiratory: Effort normal, non-labored breathing.  GI: Soft.  No distension. There is no tenderness.  Skin: WDI.   Neurologic Examination  Mental Status -  Level of arousal and orientation to time, place, and person were intact. Language including expression, naming, comprehension was assessed and found intact. Attention span and concentration were normal. Recent and remote memory were intact. Fund of Knowledge was assessed and was intact.   Cranial Nerves II - XII - II -  Complete loss of vision in left eye except to gross movement.  III, IV, VI - Extraocular movements intact. V - Facial sensation intact bilaterally. VII - Facial movement intact bilaterally. VIII - Hearing intact to voice. X - Palate elevates  symmetrically. XI - Chin turning & shoulder shrug intact bilaterally. XII - Tongue protrusion intact.   Motor Strength - The patient's strength was normal in all extremities and pronator drift was absent.  Bulk was normal and fasciculations were absent.   Motor Tone - Muscle tone was assessed at the neck and appendages and was normal. Sensory - Light touch were assessed and were symmetrical.   Coordination - The patient had normal movements in the hands and feet with no ataxia or dysmetria.  Tremor was absent. Gait and Station - deferred.   Medications  Current Facility-Administered Medications:    acetaminophen  (TYLENOL ) tablet 650 mg, 650 mg, Oral, Q6H PRN **OR** acetaminophen  (TYLENOL ) suppository 650 mg, 650 mg, Rectal, Q6H PRN, Lavanda Porter, MD   aspirin  EC tablet 81 mg, 81 mg, Oral, Daily, Brunilda Capra V, MD, 81 mg at 05/10/23 0948   cholecalciferol  (VITAMIN D3) 25 MCG (1000 UNIT) tablet 2,000 Units, 2,000 Units, Oral, QHS, Elgergawy, Dawood S, MD, 2,000 Units at 05/09/23 2119   feeding supplement (GLUCERNA SHAKE) (GLUCERNA SHAKE) liquid 237 mL, 237 mL, Oral, TID BM, Elgergawy, Dawood S, MD, 237 mL at 05/10/23 0948   heparin  ADULT infusion 100 units/mL (25000 units/250mL), 900 Units/hr, Intravenous, Continuous, Elgergawy, Ardia Kraft, MD, Last Rate: 88 mL/hr at 05/10/23 0509, 900 Units/hr at 05/10/23 0509   insulin  aspart (novoLOG) injection 0-9 Units, 0-9 Units, Subcutaneous, TID WC, Elgergawy,  Ardia Kraft, MD, 2 Units at 05/10/23 1610   lactated ringers  infusion, , Intravenous, Continuous, Elgergawy, Ardia Kraft, MD, Last Rate: 75 mL/hr at 05/10/23 0625, New Bag at 05/10/23 0625   [START ON 05/12/2023] levothyroxine  (SYNTHROID ) tablet 75 mcg, 75 mcg, Oral, Once per day on Monday Tuesday Wednesday Thursday Friday Saturday **AND** [START ON 05/11/2023] levothyroxine  (SYNTHROID ) tablet 37.5 mcg, 37.5 mcg, Oral, Every Sunday, Elgergawy, Ardia Kraft, MD   magic mouthwash, 5 mL, Oral, QID, Elgergawy, Dawood  S, MD, 5 mL at 05/09/23 2128   magnesium  oxide (MAG-OX) tablet 400 mg, 400 mg, Oral, QHS, Elgergawy, Dawood S, MD, 400 mg at 05/09/23 2119   melatonin tablet 5 mg, 5 mg, Oral, QHS PRN, Bary Boss, DO   multivitamin with minerals tablet 1 tablet, 1 tablet, Oral, QHS, Elgergawy, Ardia Kraft, MD, 1 tablet at 05/09/23 2119   pantoprazole  (PROTONIX ) EC tablet 40 mg, 40 mg, Oral, Daily, Elgergawy, Dawood S, MD, 40 mg at 05/10/23 0948   polyethylene glycol (MIRALAX  / GLYCOLAX ) packet 17 g, 17 g, Oral, Daily PRN, Hall, Carole N, DO   [START ON 05/11/2023] predniSONE  (DELTASONE ) tablet 60 mg, 60 mg, Oral, Q breakfast, Lydia Sams, Alcide Humble, MD   prochlorperazine  (COMPAZINE ) injection 5 mg, 5 mg, Intravenous, Q6H PRN, [place], Carole N, DO   sodium chloride  flush (NS) 0.9 % injection 3 mL, 3 mL, Intravenous, Q12H, Brunilda Capra V, MD, 3 mL at 05/10/23 0953   sodium chloride  flush (NS) 0.9 % injection 3-10 mL, 3-10 mL, Intravenous, Q12H, Brunilda Capra V, MD, 3 mL at 05/10/23 0953   sodium chloride  flush (NS) 0.9 % injection 3-10 mL, 3-10 mL, Intravenous, PRN, Lavanda Porter, MD  Labs and Diagnostic Imaging   CBC:  Recent Labs  Lab 05/08/23 1135 05/09/23 0532 05/10/23 0214  WBC 15.7* 11.1* 20.2*  NEUTROABS 12.8*  --   --   HGB 11.4* 11.4* 10.8*  HCT 35.7* 34.6* 33.6*  MCV 85.6 83.2 83.4  PLT 452* 426* 481*    Basic Metabolic Panel:  Lab Results  Component Value Date   NA 138 05/10/2023   K 4.3 05/10/2023   CO2 24 05/10/2023   GLUCOSE 173 (H) 05/10/2023   BUN 14 05/10/2023   CREATININE 0.60 05/10/2023   CALCIUM 8.7 (L) 05/10/2023   GFRNONAA >60 05/10/2023   GFRAA >60 09/22/2015   Lipid Panel:  Lab Results  Component Value Date   LDLCALC 121 (H) 05/08/2023   HgbA1c:  Lab Results  Component Value Date   HGBA1C 6.3 (H) 05/08/2023   Urine Drug Screen: No results found for: "LABOPIA", "COCAINSCRNUR", "LABBENZ", "AMPHETMU", "THCU", "LABBARB"  Alcohol  Level No results found for: "ETH" INR  Lab  Results  Component Value Date   INR 1.3 (H) 05/08/2023   APTT  Lab Results  Component Value Date   APTT 28 09/15/2015   AED levels: No results found for: "PHENYTOIN", "ZONISAMIDE", "LAMOTRIGINE", "LEVETIRACETA"   MRI Brain(Personally reviewed): Apparent diffusely increased FLAIR hyperintensity throughout the subarachnoid spaces as above, felt to be most consistent with artifact on this exam. 2. No other acute intracranial abnormality. No evidence for acute or chronic right PCA distribution infarct. 3. Mild chronic microvascular ischemic disease for age.  Labs  CRP elevated 28.0- 14.7, ESR elevated 108   Assessment   SHANDRIA RADICH is a 89 y.o. female  with a PMHx of hypertension and hypothyroidism who presents with severe vision loss OS. Outpatient Ophthalmology concerned vision loss 2/2 to temporal arteritis and instructed  to get evaluated at the ED. Inflammatory markers elevated which supports a diagnosis of temporal arteritis. Left temporal artery biopsy was obtained on Friday with pathology results pending.    Recommendations  - Today she completed her 3-day course of IV steroids. - She will continue on 60 mg of prednisone  daily - Will need outpatient follow-up with her Ophthalmologist - Neurology will sign off. Please call with questions or concerns ______________________________________________________________________   Signed, Laymond Priestly, NP [facility]  Electronically signed: Dr. Kortnee Bas

## 2023-05-10 NOTE — Evaluation (Signed)
 Occupational Therapy Evaluation Patient Details Name: [name] MRN: [id number] DOB: April 26, 1933 Today's Date: 05/10/2023   History of Present Illness   89 y.o. 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 Found +PE. PMH: allergies to ampicillin, adhesives elastic bandages sulfa antibiotics and latex, prediabetes, hypothyroidism, essential hypertension, history of shingles.     Clinical Impressions Pt reports having assist at baseline for bathing/ADLs, has recently been using a RW for mobility.Pt lives alone but has family there frequently to assist. Pt currently needing up to mod A for ADLs, CGA-min A for bed mobility and CGA-minA for RW. Pt unable to see out of L eye, reports it looks like "vertical lines" and is able to see white. Pt presenting with impairments listed below, will follow acutely. Recommend HHOT at d/c.     If plan is discharge home, recommend the following:   A little help with walking and/or transfers;A lot of help with bathing/dressing/bathroom;Assistance with cooking/housework;Direct supervision/assist for medications management;Direct supervision/assist for financial management;Assist for transportation;Help with stairs or ramp for entrance     Functional Status Assessment   Patient has had a recent decline in their functional status and demonstrates the ability to make significant improvements in function in a reasonable and predictable amount of time.     Equipment Recommendations   None recommended by OT     Recommendations for Other Services   PT consult     Precautions/Restrictions   Precautions Precautions: Fall Restrictions Weight Bearing Restrictions Per Provider Order: No     Mobility Bed Mobility Overal bed mobility: Needs Assistance Bed Mobility: Supine to Sit, Sit to Supine     Supine to sit: Contact guard Sit to supine: Min assist         Transfers Overall transfer level: Needs assistance Equipment used: Rolling walker (2 wheels), None Transfers: Sit to/from Stand Sit to Stand: Min assist, Contact guard assist           General transfer comment: Min assist for balance without assistive device. Practiced with RW and pt able to stabilize safely with CGA. Cues for technique      Balance Overall balance assessment: Needs assistance Sitting-balance support: No upper extremity supported, Feet supported Sitting balance-Leahy Scale: Fair     Standing balance support: Single extremity supported Standing balance-Leahy Scale: Poor                             ADL either performed or assessed with clinical judgement   ADL Overall ADL's : Needs assistance/impaired Eating/Feeding: Set up;Sitting   Grooming: Set up;Standing   Upper Body Bathing: Minimal assistance;Sitting   Lower Body Bathing: Moderate assistance;Sitting/lateral leans   Upper Body Dressing : Minimal assistance;Sitting   Lower Body Dressing: Moderate assistance;Sitting/lateral leans   Toilet Transfer: Minimal assistance;Ambulation;Rolling walker (2 wheels)           Functional mobility during ADLs: Minimal assistance;Rolling walker (2 wheels)       Vision   Vision Assessment?: Vision impaired- to be further tested in functional context Additional Comments: pt reports vision WFL out of L eye, reports  it looks like wavy vertical white lines in L eye     Perception Perception: Not tested       Praxis Praxis: Not tested       Pertinent Vitals/Pain Pain Assessment Pain Assessment: No/denies pain     Extremity/Trunk  Assessment Upper Extremity Assessment Upper Extremity Assessment: Generalized weakness   Lower Extremity Assessment Lower Extremity Assessment: Defer to PT evaluation   Cervical / Trunk Assessment Cervical / Trunk Assessment: Kyphotic   Communication Communication Communication: No apparent  difficulties   Cognition Arousal: Alert Behavior During Therapy: WFL for tasks assessed/performed Cognition: No apparent impairments                               Following commands: Intact       Cueing  General Comments   Cueing Techniques: Verbal cues  VSS   Exercises     Shoulder Instructions      Home Living Family/patient expects to be discharged to:: Private residence Living Arrangements: Alone Available Help at Discharge: Family;Available 24 hours/day Type of Home: House Home Access: Stairs to enter [employer] of Steps: 3   Home Layout: One level     Bathroom Shower/Tub: Chief Strategy Officer: Standard Bathroom Accessibility: No   Home Equipment: Agricultural consultant (2 wheels);Cane - single point;Shower seat          Prior Functioning/Environment Prior Level of Function : Independent/Modified Independent;History of Falls (last six months)             Mobility Comments: one fall outside. SPC primary use. Recently using RW due to weakness. ADLs Comments: Assist for bathing, intermittent assist for other ADLs.    OT Problem List: Decreased strength;Decreased range of motion;Decreased activity tolerance;Impaired balance (sitting and/or standing);Impaired vision/perception   OT Treatment/Interventions: Self-care/ADL training;Therapeutic exercise;Energy conservation;DME and/or AE instruction;Therapeutic activities;Balance training;Patient/family education;Visual/perceptual remediation/compensation      OT Goals(Current goals can be found in the care plan section)   Acute Rehab OT Goals Patient Stated Goal: none stated OT Goal Formulation: With patient Time For Goal Achievement: 05/24/23 Potential to Achieve Goals: Good ADL Goals Pt Will Perform Upper Body Dressing: Independently;sitting Pt Will Perform Lower Body Dressing: with supervision;sitting/lateral leans;sit to/from stand Pt Will Transfer to Toilet: with  supervision;ambulating;regular height toilet Additional ADL Goal #1: Pt will utilize visual scanning strategies to attend to L side with min cues in prep for ADLs   OT Frequency:  Min 2X/week    Co-evaluation              AM-PAC OT "6 Clicks" Daily Activity     Outcome Measure Help from another person eating meals?: A Little Help from another person taking care of personal grooming?: A Little Help from another person toileting, which includes using toliet, bedpan, or urinal?: A Little Help from another person bathing (including washing, rinsing, drying)?: A Lot Help from another person to put on and taking off regular upper body clothing?: A Little Help from another person to put on and taking off regular lower body clothing?: A Lot 6 Click Score: 16   End of Session Equipment Utilized During Treatment: Gait belt;Rolling walker (2 wheels) Nurse Communication: Mobility status  Activity Tolerance: Patient tolerated treatment well Patient left: in bed;with call bell/phone within reach;with bed alarm set;with family/visitor present  OT Visit Diagnosis: Unsteadiness on feet (R26.81);Other abnormalities of gait and mobility (R26.89);Muscle weakness (generalized) (M62.81)                Time: 4098-1191 OT Time Calculation (min): 42 min Charges:  OT General Charges $OT Visit: 1 Visit OT Evaluation $OT Eval Moderate Complexity: 1 Mod OT Treatments $Self Care/Home Management : 8-22 mins $Therapeutic Activity: 8-22 mins  Zoanne Newill K, OTD, OTR/L [employer] Preferred Acute Rehab [phone]   Antionette Kirks 05/10/2023, 4:13 PM

## 2023-05-10 NOTE — Progress Notes (Signed)
 Pharmacy Consult for Heparin  Indication: pulmonary embolus Brief A/P: Heparin  level subtherapeutic Increase Heparin  rate  Allergies  Allergen Reactions   Ampicillin Cough    Has patient had a PCN reaction causing immediate rash, facial/tongue/throat swelling, SOB or lightheadedness with hypotension: No Has patient had a PCN reaction causing severe rash involving mucus membranes or skin necrosis: No Has patient had a PCN reaction that required hospitalization No Has patient had a PCN reaction occurring within the last 10 years: No If all of the above answers are "NO", then may proceed with Cephalosporin use.   Elastic Bandages & [Zinc]    Sulfa Antibiotics Cough   Latex Rash    Patient Measurements: Height: 4\' 9"  (144.8 cm) Weight: 63.8 kg (140 lb 10.5 oz) IBW/kg (Calculated) : 38.6 HEPARIN  DW (KG): 52.7  Vital Signs: Temp: 97.5 F (36.4 C) (05/03 0425) Temp Source: Oral (05/03 0425) BP: 132/59 (05/03 0425) Pulse Rate: 73 (05/03 0425)  Labs: Recent Labs    05/08/23 1135 05/09/23 0532 05/10/23 0214  HGB 11.4* 11.4* 10.8*  HCT 35.7* 34.6* 33.6*  PLT 452* 426* 481*  LABPROT 16.6*  --   --   INR 1.3*  --   --   HEPARINUNFRC  --   --  0.20*  CREATININE 0.79 0.57 0.60  CKTOTAL 21*  --   --     Estimated Creatinine Clearance: 38.1 mL/min (by C-G formula based on SCr of 0.6 mg/dL).  Assessment: 89 y.o. female with PE s/p L temporal artery biopsy 5/2 for heparin .   Goal of Therapy:  Heparin  level 0.3-0.5 units/mL Monitor platelets by anticoagulation protocol: Yes   Plan:  Increase Heparin  900 units/hr Check heparin  level in 8 hours.  Claudine Cullens, PharmD, BCPS

## 2023-05-11 DIAGNOSIS — H5462 Unqualified visual loss, left eye, normal vision right eye: Secondary | ICD-10-CM | POA: Diagnosis not present

## 2023-05-11 LAB — COMPREHENSIVE METABOLIC PANEL WITH GFR
ALT: 64 U/L — ABNORMAL HIGH (ref 0–44)
AST: 77 U/L — ABNORMAL HIGH (ref 15–41)
Albumin: 1.9 g/dL — ABNORMAL LOW (ref 3.5–5.0)
Alkaline Phosphatase: 136 U/L — ABNORMAL HIGH (ref 38–126)
Anion gap: 10 (ref 5–15)
BUN: 21 mg/dL (ref 8–23)
CO2: 25 mmol/L (ref 22–32)
Calcium: 8.2 mg/dL — ABNORMAL LOW (ref 8.9–10.3)
Chloride: 104 mmol/L (ref 98–111)
Creatinine, Ser: 0.66 mg/dL (ref 0.44–1.00)
GFR, Estimated: >60 mL/min (ref >60)
Glucose, Bld: 144 mg/dL — ABNORMAL HIGH (ref 70–99)
Potassium: 4.1 mmol/L (ref 3.5–5.1)
Sodium: 139 mmol/L (ref 135–145)
Total Bilirubin: 0.6 mg/dL (ref 0.0–1.2)
Total Protein: 5.5 g/dL — ABNORMAL LOW (ref 6.5–8.1)

## 2023-05-11 LAB — CBC
HCT: 31.8 % — ABNORMAL LOW (ref 36.0–46.0)
Hemoglobin: 10 g/dL — ABNORMAL LOW (ref 12.0–15.0)
MCH: 26.7 pg (ref 26.0–34.0)
MCHC: 31.4 g/dL (ref 30.0–36.0)
MCV: 85 fL (ref 80.0–100.0)
Platelets: 389 10*3/uL (ref 150–400)
RBC: 3.74 MIL/uL — ABNORMAL LOW (ref 3.87–5.11)
RDW: 14.3 % (ref 11.5–15.5)
WBC: 16.8 10*3/uL — ABNORMAL HIGH (ref 4.0–10.5)
nRBC: 0 % (ref 0.0–0.2)

## 2023-05-11 LAB — MAGNESIUM: Magnesium: 2.4 mg/dL (ref 1.7–2.4)

## 2023-05-11 LAB — GLUCOSE, CAPILLARY
Glucose-Capillary: 126 mg/dL — ABNORMAL HIGH (ref 70–99)
Glucose-Capillary: 160 mg/dL — ABNORMAL HIGH (ref 70–99)
Glucose-Capillary: 214 mg/dL — ABNORMAL HIGH (ref 70–99)
Glucose-Capillary: 181 mg/dL — ABNORMAL HIGH (ref 70–99)

## 2023-05-11 LAB — PHOSPHORUS: Phosphorus: 2.7 mg/dL (ref 2.5–4.6)

## 2023-05-11 MED ORDER — MIRTAZAPINE 15 MG PO TBDP
7.5000 mg | ORAL_TABLET | Freq: Every day | ORAL | Status: DC
  Administered 2023-05-11: 7.5 mg via ORAL
  Filled 2023-05-11 (×2): qty 0.5

## 2023-05-11 MED ORDER — MELATONIN 5 MG PO TABS
5.0000 mg | ORAL_TABLET | Freq: Every day | ORAL | Status: DC
  Administered 2023-05-11: 5 mg via ORAL
  Filled 2023-05-11: qty 1

## 2023-05-11 NOTE — Plan of Care (Signed)

## 2023-05-11 NOTE — Discharge Instructions (Addendum)
 Information on my medicine - ELIQUIS (apixaban)  This medication education was reviewed with me or my healthcare representative as part of my discharge preparation.  Why was Eliquis prescribed for you? Eliquis was prescribed to treat blood clots that may have been found in the veins of your legs (deep vein thrombosis) or in your lungs (pulmonary embolism) and to reduce the risk of them occurring again.  What do You need to know about Eliquis ? The starting dose is 10 mg (two 5 mg tablets) taken TWICE daily for the FIRST SEVEN (7) DAYS, then on Saturday, May 10th for the evening dose, the dose is reduced to ONE 5 mg tablet taken TWICE daily.  Eliquis may be taken with or without food.   Try to take the dose about the same time in the morning and in the evening. If you have difficulty swallowing the tablet whole please discuss with your pharmacist how to take the medication safely.  Take Eliquis exactly as prescribed and DO NOT stop taking Eliquis without talking to the doctor who prescribed the medication.  Stopping may increase your risk of developing a new blood clot.  Refill your prescription before you run out.  After discharge, you should have regular check-up appointments with your healthcare provider that is prescribing your Eliquis.    What do you do if you miss a dose? If a dose of ELIQUIS is not taken at the scheduled time, take it as soon as possible on the same day and twice-daily administration should be resumed. The dose should not be doubled to make up for a missed dose.  Important Safety Information A possible side effect of Eliquis is bleeding. You should call your healthcare provider right away if you experience any of the following: Bleeding from an injury or your nose that does not stop. Unusual colored urine (red or dark brown) or unusual colored stools (red or black). Unusual bruising for unknown reasons. A serious fall or if you hit your head (even if there is  no bleeding).  Some medicines may interact with Eliquis and might increase your risk of bleeding or clotting while on Eliquis. To help avoid this, consult your healthcare provider or pharmacist prior to using any new prescription or non-prescription medications, including herbals, vitamins, non-steroidal anti-inflammatory drugs (NSAIDs) and supplements.  This website has more information on Eliquis (apixaban): [email or web address]://[email or web address].[email or web address]/home

## 2023-05-11 NOTE — Progress Notes (Signed)
 PROGRESS NOTE    [name]  NWG:[id number] DOB: 10-May-1933 DOA: 05/08/2023 PCP: Jimmey Mould, MD  Chief Complaint  Patient presents with   Eye Problem    Brief Narrative:   [name] is a 89 y.o. 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  Inflammatory markers are elevated, would support this diagnosis.  Assessment & Plan:   Principal Problem:   Vision loss of left eye  Left eye vision loss Temporal arteritis -Presents with vision loss in left eye, significantly inflammatory markers, including CRP, ESR and D-dimers -High clinical concern for temporal arteritis. - Continue with high-dose IV steroids, 500 mg IV Solu-Medrol  x 3 days, then prolonged steroid taper. 2/2 temporal arteritis per ophthalmology.  -Will start on Protonix  for GI prophylaxis -Vascular surgery input greatly appreciated, status post left temporal artery biopsy.  Chronic systolic/diastolic CHF -The echo significant for EF 40%, with global hypokinesis, worse on the septum, and septal lateral desynchrony, assistant with left bundle branch block as well grade 1 diastolic dysfunction -she does not appear to be in any volume overload, no previous echo to compare -Continue to monitor on telemetry, will start on low-dose beta-blocker and start GDMT gradually if tolerates she is on low-dose Coreg, will add low-dose losartan as well. -She will need further workup by cardiology once more stable, likely as an outpatient. - As well there is evidence of three-vessel disease on her CTA chest, LDL is elevated, continue with aspirin , will start on low-dose statin whenever her generalized weakness and bodyaches improves  LBBB on EKG: -No chest pain, no  shortness of breath, troponins are negative x 2 - Please see above discussion   Elevated D-dimers Bilateral DVT and PE -Started on heparin  GTT, no evidence of right heart strain on echo or CTA chest, will transition to Eliquis   hypothyroidism -Continue with home dose Synthroid  -Both TSH, and free T4 is elevated, so I will keep current dose as these lab abnormalities most likely in the setting of sick euthyroid syndrome.   Transaminitis -most likely in the setting of inflammatory response as all inflammatory markers are elevated, right upper quadrant ultrasound, with no acute findings, hepatitis panel was negative  Dysphagia Weight loss - CT neck, chest, abdomen pelvis obtained to rule out malignancy, main significant finding is bilateral ovarian cysts, discussed with patient, daughter at bedside, she will follow-up with GYN as an outpatient regarding that.  Failure to thrive - Significant weight loss, weakness, deconditioning, movement significantly low at 2, will consult nutritionist and PT/OT  Insomnia, poor appetite, depression - Will start on mirtazapine  DVT prophylaxis: Eliquis Code Status: Full code Family Communication: Discussed with daughter at bedside Disposition:   Status is: Inpatient    Consultants:  Neurology vascular   Sbjective:  Patient again reports poor night sleep, poor appetite Objective: Vitals:   05/11/23 0759 05/11/23 0800 05/11/23 0952 05/11/23 1133  BP:  106/68  (!) 128/53  Pulse: (!) 58 (!) 58 68 73  Resp:  15 14 20   Temp:  97.8 F (36.6 C)  97.8 F (36.6 C)  TempSrc:  Oral  Oral  SpO2:  92% 92% 96%  Weight:      Height:        Intake/Output Summary (Last 24 hours) at 05/11/2023 1405 Last data  filed at 05/10/2023 2155 Gross per 24 hour  Intake 3 ml  Output --  Net 3 ml   Filed Weights   05/08/23 1105 05/09/23 0433 05/10/23 0425  Weight: 63 kg 61.2 kg 63.8 kg    Examination:  Awake Alert, Oriented X 3, No new F.N deficits,  Normal affect Symmetrical Chest wall movement, Good air movement bilaterally, CTAB RRR,No Gallops,Rubs or new Murmurs, No Parasternal Heave +ve B.Sounds, Abd Soft, No tenderness, No rebound - guarding or rigidity. No Cyanosis, Clubbing or edema, No new Rash or bruise       Data Reviewed: I have personally reviewed following labs and imaging studies  CBC: Recent Labs  Lab 05/08/23 1135 05/09/23 0532 05/10/23 0214 05/11/23 0646  WBC 15.7* 11.1* 20.2* 16.8*  NEUTROABS 12.8*  --   --   --   HGB 11.4* 11.4* 10.8* 10.0*  HCT 35.7* 34.6* 33.6* 31.8*  MCV 85.6 83.2 83.4 85.0  PLT 452* 426* 481* 389    Basic Metabolic Panel: Recent Labs  Lab 05/08/23 1135 05/08/23 1300 05/09/23 0532 05/10/23 0214 05/11/23 0646  NA 136  --  136 138 139  K 2.9*  --  4.3 4.3 4.1  CL 97*  --  100 104 104  CO2 24  --  23 24 25   GLUCOSE 120*  --  180* 173* 144*  BUN 11  --  11 14 21   CREATININE 0.79  --  0.57 0.60 0.66  CALCIUM 8.3*  --  8.5* 8.7* 8.2*  MG  --  2.0  --  2.3 2.4  PHOS 3.2  --   --  2.5 2.7    GFR: Estimated Creatinine Clearance: 38.1 mL/min (by C-G formula based on SCr of 0.66 mg/dL).  Liver Function Tests: Recent Labs  Lab 05/08/23 1135 05/09/23 0532 05/10/23 0214 05/11/23 0646  AST 174* 173* 120* 77*  ALT 109* 111* 81* 64*  ALKPHOS 177* 221* 194* 136*  BILITOT 1.0 0.8 0.5 0.6  PROT 6.6 6.8 6.1* 5.5*  ALBUMIN 2.1* 2.0* 1.9* 1.9*    CBG: Recent Labs  Lab 05/10/23 1154 05/10/23 1755 05/10/23 2045 05/11/23 0913 05/11/23 1131  GLUCAP 251* 138* 208* 126* 160*     Recent Results (from the past 240 hours)  Resp panel by RT-PCR (RSV, Flu A&B, Covid) Anterior Nasal Swab     Status: None   Collection Time: 05/08/23  1:29 PM   Specimen: Anterior Nasal Swab  Result Value Ref Range Status   SARS Coronavirus 2 by RT PCR NEGATIVE NEGATIVE Final   Influenza A by PCR NEGATIVE NEGATIVE Final   Influenza B by PCR NEGATIVE NEGATIVE Final    Comment: (NOTE) The Xpert  Xpress SARS-CoV-2/FLU/RSV plus assay is intended as an aid in the diagnosis of influenza from Nasopharyngeal swab specimens and should not be used as a sole basis for treatment. Nasal washings and aspirates are unacceptable for Xpert Xpress SARS-CoV-2/FLU/RSV testing.  Fact Sheet for Patients: BloggerCourse.com  Fact Sheet for Healthcare Providers: SeriousBroker.it  This test is not yet approved or cleared by the [place]  FDA and has been authorized for detection and/or diagnosis of SARS-CoV-2 by FDA under an Emergency Use Authorization (EUA). This EUA will remain in effect (meaning this test can be used) for the duration of the COVID-19 declaration under Section 564(b)(1) of the Act, 21 U.S.C. section 360bbb-3(b)(1), unless the authorization is terminated or revoked.     Resp Syncytial Virus by PCR NEGATIVE NEGATIVE Final    Comment: (  NOTE) Fact Sheet for Patients: BloggerCourse.com  Fact Sheet for Healthcare Providers: SeriousBroker.it  This test is not yet approved or cleared by the [place]  FDA and has been authorized for detection and/or diagnosis of SARS-CoV-2 by FDA under an Emergency Use Authorization (EUA). This EUA will remain in effect (meaning this test can be used) for the duration of the COVID-19 declaration under Section 564(b)(1) of the Act, 21 U.S.C. section 360bbb-3(b)(1), unless the authorization is terminated or revoked.  Performed at [facility], [address], [place], [place] [address]          Radiology Studies: VAS US  LOWER EXTREMITY VENOUS (DVT) Result Date: 05/10/2023  Lower Venous DVT Study Patient Name:  [name]  Date of Exam:   05/10/2023 Medical Rec #: [id number]           Accession #:    [id number] Date of Birth: 06/08/1933           Patient Gender: F Patient Age:   30 years Exam Location:  [facility]  Procedure:      VAS US  LOWER EXTREMITY VENOUS (DVT) Referring Phys: Seena Dadds --------------------------------------------------------------------------------  Indications: Pulmonary embolism.  Limitations: Pain and guarding. Comparison Study: 01/05/2016 Lower extremity venous duplex Performing Technologist: Delford Felling MHA, RDMS, RVT, RDCS  Examination Guidelines: A complete evaluation includes B-mode imaging, spectral Doppler, color Doppler, and power Doppler as needed of all accessible portions of each vessel. Bilateral testing is considered an integral part of a complete examination. Limited examinations for reoccurring indications may be performed as noted. The reflux portion of the exam is performed with the patient in reverse Trendelenburg.  +---------+---------------+---------+-----------+----------+--------------+ RIGHT    CompressibilityPhasicitySpontaneityPropertiesThrombus Aging +---------+---------------+---------+-----------+----------+--------------+ CFV      Full           Yes      Yes                                 +---------+---------------+---------+-----------+----------+--------------+ SFJ      Full                                                        +---------+---------------+---------+-----------+----------+--------------+ FV Prox  Full                                                        +---------+---------------+---------+-----------+----------+--------------+ FV Mid   Full           Yes      Yes                                 +---------+---------------+---------+-----------+----------+--------------+ FV DistalFull                                                        +---------+---------------+---------+-----------+----------+--------------+ PFV      Full                                                        +---------+---------------+---------+-----------+----------+--------------+  POP      Full           Yes       Yes                                 +---------+---------------+---------+-----------+----------+--------------+ PTV      None                    No                   Acute          +---------+---------------+---------+-----------+----------+--------------+ PERO     None                    No                   Acute          +---------+---------------+---------+-----------+----------+--------------+   +---------+---------------+---------+-----------+----------+--------------+ LEFT     CompressibilityPhasicitySpontaneityPropertiesThrombus Aging +---------+---------------+---------+-----------+----------+--------------+ CFV      Full           Yes      Yes                                 +---------+---------------+---------+-----------+----------+--------------+ SFJ      Full                                                        +---------+---------------+---------+-----------+----------+--------------+ FV Prox  Full                                                        +---------+---------------+---------+-----------+----------+--------------+ FV Mid   Full           Yes      Yes                                 +---------+---------------+---------+-----------+----------+--------------+ FV DistalFull                                                        +---------+---------------+---------+-----------+----------+--------------+ PFV      Full                                                        +---------+---------------+---------+-----------+----------+--------------+ POP      Full           Yes      Yes                                 +---------+---------------+---------+-----------+----------+--------------+ PTV      None  No                   Acute          +---------+---------------+---------+-----------+----------+--------------+ PERO     None                    No                   Acute           +---------+---------------+---------+-----------+----------+--------------+     Summary: RIGHT: - Findings consistent with acute deep vein thrombosis involving the right posterior tibial veins, and right peroneal veins.  - No cystic structure found in the popliteal fossa.  LEFT: - Findings consistent with acute deep vein thrombosis involving the left peroneal veins, and left posterior tibial veins.  - No cystic structure found in the popliteal fossa.  *See table(s) above for measurements and observations. Electronically signed by Delaney Fearing on 05/10/2023 at 11:38:56 AM.    Final    CT SOFT TISSUE NECK W CONTRAST Result Date: 05/09/2023 CLINICAL DATA:  Difficulty swallowing. Enlarged and painful cervical lymph noted. EXAM: CT NECK WITH CONTRAST TECHNIQUE: Multidetector CT imaging of the neck was performed using the standard protocol following the bolus administration of intravenous contrast. RADIATION DOSE REDUCTION: This exam was performed according to the departmental dose-optimization program which includes automated exposure control, adjustment of the mA and/or kV according to patient size and/or use of iterative reconstruction technique. CONTRAST:  75mL OMNIPAQUE IOHEXOL 350 MG/ML SOLN COMPARISON:  None available FINDINGS: Pharynx and larynx: No focal mucosal or submucosal lesions are present. The nasopharynx is clear. The soft palate and tongue base are normal. Oropharynx is unremarkable. Vallecula and epiglottis are within normal limits. Aryepiglottic folds and piriform sinuses are clear. Vocal cords are midline and symmetric. Trachea is clear. Salivary glands: The submandibular and parotid glands and ducts are within normal limits. Thyroid : Normal Lymph nodes: Normal Vascular: Atherosclerotic calcifications are present at the carotid bifurcations bilaterally. No significant stenosis is present on the right. The left ICA lumen narrowed to 2.4 mm this compares to more distal measurement of 4.8 mm. Limited  intracranial: Within normal limits. Visualized orbits: Bilateral lens replacements are noted. Globes and orbits are otherwise unremarkable. Mastoids and visualized paranasal sinuses: The paranasal sinuses and mastoid air cells are clear. Skeleton: Ankylosis is present cross the disc space at C5-6. Posterior elements are fused at C4-5. Adjacent level disease is present C5-6 with facet spurring bilaterally. Mild left foraminal stenosis is present. No focal osseous lesions are present. Upper chest: The lung apices are clear. The thoracic inlet is within normal limits. IMPRESSION: 1. No acute or focal lesion to explain the patient's symptoms. 2. No adenopathy or mass. 3. Atherosclerotic calcifications at the carotid bifurcations bilaterally. 4. Ankylosis across the disc space at C5-6. 5. Posterior elements are fused at C4-5. 6. Adjacent level disease at C5-6 with facet spurring bilaterally. Mild left foraminal stenosis is present. Electronically Signed   By: Audree Leas M.D.   On: 05/09/2023 18:17   CT ABDOMEN PELVIS W CONTRAST Result Date: 05/09/2023 CLINICAL DATA:  weight loss, elevated D Dimer, difficulty swallowing, rule out , malignancy , PE EXAM: CT ABDOMEN AND PELVIS WITH CONTRAST TECHNIQUE: Multidetector CT imaging of the abdomen and pelvis was performed using the standard protocol following bolus administration of intravenous contrast. RADIATION DOSE REDUCTION: This exam was performed according to the departmental dose-optimization program which includes automated exposure control, adjustment of the mA  and/or kV according to patient size and/or use of iterative reconstruction technique. CONTRAST:  75mL OMNIPAQUE IOHEXOL 350 MG/ML SOLN COMPARISON:  None Available. FINDINGS: Lower chest: See chest CT report today. Hepatobiliary: No focal liver abnormality is seen. Status post cholecystectomy. No biliary dilatation. Pancreas: No focal abnormality or ductal dilatation. Spleen: No focal abnormality.   Normal size. Adrenals/Urinary Tract: No suspicious renal or adrenal abnormality/lesion. No stones or hydronephrosis. Urinary bladder unremarkable. Stomach/Bowel: Sigmoid diverticulosis. No active diverticulitis. Stomach and small bowel decompressed. No bowel obstruction or inflammatory process. Vascular/Lymphatic: Aortic atherosclerosis. No evidence of aneurysm or adenopathy. Reproductive: Calcifications in the uterus, likely small calcified fibroids. 4.7 cm right ovarian cyst. 1.2 cm left ovarian cyst. Other: No free fluid or free air. Musculoskeletal: No acute bony abnormality. IMPRESSION: No acute findings in the abdomen or pelvis. Sigmoid diverticulosis.  No active diverticulitis. Bilateral ovarian cysts, the largest 4.7 cm in the right ovary. Recommend follow-up US  in 6-12 months. Note: This recommendation does not apply to premenarchal patients and to those with increased risk (genetic, family history, elevated tumor markers or other high-risk factors) of ovarian cancer. Reference: JACR 2020 Feb; 17(2):248-254 Aortic atherosclerosis. Electronically Signed   By: Janeece Mechanic M.D.   On: 05/09/2023 17:44   CT Angio Chest Pulmonary Embolism (PE) W or WO Contrast Result Date: 05/09/2023 CLINICAL DATA:  weight loss, elevated D Dimer, difficulty swallowing, rule out malignancy , PE EXAM: CT ANGIOGRAPHY CHEST WITH CONTRAST TECHNIQUE: Multidetector CT imaging of the chest was performed using the standard protocol during bolus administration of intravenous contrast. Multiplanar CT image reconstructions and MIPs were obtained to evaluate the vascular anatomy. RADIATION DOSE REDUCTION: This exam was performed according to the departmental dose-optimization program which includes automated exposure control, adjustment of the mA and/or kV according to patient size and/or use of iterative reconstruction technique. CONTRAST:  75mL OMNIPAQUE IOHEXOL 350 MG/ML SOLN COMPARISON:  None Available. FINDINGS: Cardiovascular:  Filling defects in the posterior right lower lobe pulmonary arterial branches compatible with pulmonary emboli. No evidence of right heart strain. Mild cardiomegaly. Three-vessel coronary artery disease. Moderate aortic atherosclerosis. No evidence of aortic aneurysm. Mediastinum/Nodes: No mediastinal, hilar, or axillary adenopathy. Trachea and esophagus are unremarkable. Thyroid  unremarkable. Lungs/Pleura: Dependent and bibasilar atelectasis. Trace bilateral pleural effusions. Upper Abdomen: See abdominal CT report Musculoskeletal: Chest wall soft tissues are unremarkable. No acute bony abnormality. Diffuse degenerative changes throughout the thoracic spine. Review of the MIP images confirms the above findings. IMPRESSION: Posterior right lower lobe segmental pulmonary emboli. No evidence of right heart strain. Cardiomegaly, three-vessel coronary artery disease. Dependent and bibasilar atelectasis. Trace bilateral pleural effusions. Aortic Atherosclerosis (ICD10-I70.0). These results will be called to the ordering clinician or representative by the Radiologist Assistant, and communication documented in the PACS or [employer]. Electronically Signed   By: Janeece Mechanic M.D.   On: 05/09/2023 17:41        Scheduled Meds:  apixaban  10 mg Oral BID   Followed by   Cecily Cohen ON 05/17/2023] apixaban  5 mg Oral BID   aspirin  EC  81 mg Oral Daily   carvedilol  3.125 mg Oral BID WC   cholecalciferol   2,000 Units Oral QHS   docusate sodium   100 mg Oral BID   feeding supplement (GLUCERNA SHAKE)  237 mL Oral TID BM   insulin  aspart  0-9 Units Subcutaneous TID WC   [START ON 05/12/2023] levothyroxine   75 mcg Oral Once per day on Monday Tuesday Wednesday Thursday Friday Saturday   And  levothyroxine   37.5 mcg Oral Every Sunday   losartan  25 mg Oral Daily   magic mouthwash  5 mL Oral QID   magnesium  oxide  400 mg Oral QHS   melatonin  5 mg Oral QHS   mirtazapine  7.5 mg Oral QHS   multivitamin with  minerals  1 tablet Oral QHS   pantoprazole   40 mg Oral Daily   predniSONE   60 mg Oral Q breakfast   sodium chloride  flush  3 mL Intravenous Q12H   sodium chloride  flush  3-10 mL Intravenous Q12H   Continuous Infusions:     LOS: 3 days       Seena Dadds, MD [facility]   To contact the attending provider between 7A-7P or the covering provider during after hours 7P-7A, please log into the web site [email or web address] and access using universal [place] password for that web site. If you do not have the password, please call the hospital operator.  05/11/2023, 2:05 PM

## 2023-05-11 NOTE — Progress Notes (Signed)
 Initial Nutrition Assessment  DOCUMENTATION CODES:   Not applicable  INTERVENTION:  Glucerna Shake po TID, each supplement provides 220 kcal and 10 grams of protein   Magic cup TID with meals, each supplement provides 290 kcal and 9 grams of protein  MVI with minerals.  NUTRITION DIAGNOSIS:   Inadequate oral intake related to poor appetite as evidenced by per patient/family report.  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 Adm for vision loss of L eye.   Pt with poor appetite, was started on appetite stimulant. Significant wt loss and failure to thrive via chart notes.  Medications reviewed and include: mirtazapine, Vit D3, SSI 0-9 units with meals, synthroid   Labs reviewed: AST 77 H, AST 64 H, A1c 6.3%, CBGs 126-251 x 24h   Intake/Output Summary (Last 24 hours) at 05/11/2023 1250 Last data filed at 05/10/2023 2155 Gross per 24 hour  Intake 3 ml  Output --  Net 3 ml    NUTRITION - FOCUSED PHYSICAL EXAM:  Defer to f/u  Diet Order:   Diet Order             DIET DYS 3 Room service appropriate? Yes; Fluid consistency: Thin  Diet effective now                   EDUCATION NEEDS:   No education needs have been identified at this time  Skin:  Skin Assessment: Skin Integrity Issues: Skin Integrity Issues:: Incisions Incisions: L head  Last BM:  5/4  Height:   Ht Readings from Last 1 Encounters:  05/08/23 4\' 9"  (1.448 m)    Weight:   Wt Readings from Last 1 Encounters:  05/10/23 63.8 kg    BMI:  Body mass index is 30.44 kg/m.  Estimated Nutritional Needs:   Kcal:  1400-1600  Protein:   90-110  Fluid:  >1.4L or per MD  Laren Player, MPH, RD, LDN Clinical Dietitian Contact information can be found at [facility].

## 2023-05-12 ENCOUNTER — Other Ambulatory Visit (HOSPITAL_COMMUNITY): Payer: Self-pay

## 2023-05-12 ENCOUNTER — Telehealth (INDEPENDENT_AMBULATORY_CARE_PROVIDER_SITE_OTHER): Payer: Self-pay | Admitting: Otolaryngology

## 2023-05-12 ENCOUNTER — Encounter (HOSPITAL_COMMUNITY): Payer: Self-pay | Admitting: Vascular Surgery

## 2023-05-12 ENCOUNTER — Telehealth (HOSPITAL_COMMUNITY): Payer: Self-pay | Admitting: Pharmacy Technician

## 2023-05-12 DIAGNOSIS — I2510 Atherosclerotic heart disease of native coronary artery without angina pectoris: Secondary | ICD-10-CM | POA: Insufficient documentation

## 2023-05-12 DIAGNOSIS — M316 Other giant cell arteritis: Secondary | ICD-10-CM | POA: Insufficient documentation

## 2023-05-12 DIAGNOSIS — I5021 Acute systolic (congestive) heart failure: Secondary | ICD-10-CM | POA: Insufficient documentation

## 2023-05-12 DIAGNOSIS — I2699 Other pulmonary embolism without acute cor pulmonale: Secondary | ICD-10-CM | POA: Insufficient documentation

## 2023-05-12 DIAGNOSIS — I447 Left bundle-branch block, unspecified: Secondary | ICD-10-CM | POA: Insufficient documentation

## 2023-05-12 LAB — PROTEIN ELECTROPHORESIS, SERUM
A/G Ratio: 0.5 — ABNORMAL LOW (ref 0.7–1.7)
Albumin ELP: 2.2 g/dL — ABNORMAL LOW (ref 2.9–4.4)
Alpha-1-Globulin: 0.6 g/dL — ABNORMAL HIGH (ref 0.0–0.4)
Alpha-2-Globulin: 1.6 g/dL — ABNORMAL HIGH (ref 0.4–1.0)
Beta Globulin: 1.2 g/dL (ref 0.7–1.3)
Gamma Globulin: 1.3 g/dL (ref 0.4–1.8)
Globulin, Total: 4.7 g/dL — ABNORMAL HIGH (ref 2.2–3.9)
Total Protein ELP: 6.9 g/dL (ref 6.0–8.5)

## 2023-05-12 LAB — COMPREHENSIVE METABOLIC PANEL WITH GFR
ALT: 70 U/L — ABNORMAL HIGH (ref 0–44)
AST: 72 U/L — ABNORMAL HIGH (ref 15–41)
Albumin: 1.9 g/dL — ABNORMAL LOW (ref 3.5–5.0)
Alkaline Phosphatase: 133 U/L — ABNORMAL HIGH (ref 38–126)
Anion gap: 6 (ref 5–15)
BUN: 18 mg/dL (ref 8–23)
CO2: 26 mmol/L (ref 22–32)
Calcium: 8.2 mg/dL — ABNORMAL LOW (ref 8.9–10.3)
Chloride: 108 mmol/L (ref 98–111)
Creatinine, Ser: 0.68 mg/dL (ref 0.44–1.00)
GFR, Estimated: >60 mL/min (ref >60)
Glucose, Bld: 145 mg/dL — ABNORMAL HIGH (ref 70–99)
Potassium: 3.9 mmol/L (ref 3.5–5.1)
Sodium: 140 mmol/L (ref 135–145)
Total Bilirubin: 0.6 mg/dL (ref 0.0–1.2)
Total Protein: 5.6 g/dL — ABNORMAL LOW (ref 6.5–8.1)

## 2023-05-12 LAB — CBC
HCT: 33.5 % — ABNORMAL LOW (ref 36.0–46.0)
Hemoglobin: 10.7 g/dL — ABNORMAL LOW (ref 12.0–15.0)
MCH: 27.2 pg (ref 26.0–34.0)
MCHC: 31.9 g/dL (ref 30.0–36.0)
MCV: 85 fL (ref 80.0–100.0)
Platelets: 407 10*3/uL — ABNORMAL HIGH (ref 150–400)
RBC: 3.94 MIL/uL (ref 3.87–5.11)
RDW: 14.3 % (ref 11.5–15.5)
WBC: 13.3 10*3/uL — ABNORMAL HIGH (ref 4.0–10.5)
nRBC: 0 % (ref 0.0–0.2)

## 2023-05-12 LAB — SURGICAL PATHOLOGY

## 2023-05-12 LAB — PHOSPHORUS: Phosphorus: 2.6 mg/dL (ref 2.5–4.6)

## 2023-05-12 LAB — MAGNESIUM: Magnesium: 2.2 mg/dL (ref 1.7–2.4)

## 2023-05-12 LAB — GLUCOSE, CAPILLARY
Glucose-Capillary: 104 mg/dL — ABNORMAL HIGH (ref 70–99)
Glucose-Capillary: 196 mg/dL — ABNORMAL HIGH (ref 70–99)

## 2023-05-12 MED ORDER — NYSTATIN 100000 UNIT/ML MT SUSP
5.0000 mL | Freq: Four times a day (QID) | OROMUCOSAL | Status: DC
Start: 2023-05-12 — End: 2023-05-12

## 2023-05-12 MED ORDER — CARVEDILOL 3.125 MG PO TABS
3.1250 mg | ORAL_TABLET | Freq: Two times a day (BID) | ORAL | 0 refills | Status: DC
  Filled 2023-05-12: qty 60, 30d supply, fill #0

## 2023-05-12 MED ORDER — PANTOPRAZOLE SODIUM 40 MG PO TBEC
40.0000 mg | DELAYED_RELEASE_TABLET | Freq: Every day | ORAL | 0 refills | Status: AC
  Filled 2023-05-12: qty 30, 30d supply, fill #0

## 2023-05-12 MED ORDER — PREDNISONE 20 MG PO TABS
60.0000 mg | ORAL_TABLET | Freq: Every day | ORAL | 0 refills | Status: DC
  Filled 2023-05-12: qty 80, 26d supply, fill #0

## 2023-05-12 MED ORDER — SULFAMETHOXAZOLE-TRIMETHOPRIM 800-160 MG PO TABS
1.0000 | ORAL_TABLET | ORAL | 0 refills | Status: DC
  Filled 2023-05-12: qty 30, 70d supply, fill #0

## 2023-05-12 MED ORDER — DOCUSATE SODIUM 100 MG PO CAPS: 100.0000 mg | ORAL_CAPSULE | Freq: Two times a day (BID) | ORAL | Status: AC | PRN

## 2023-05-12 MED ORDER — MELATONIN 5 MG PO TABS: 5.0000 mg | ORAL_TABLET | Freq: Every day | ORAL | Status: AC

## 2023-05-12 MED ORDER — EMPAGLIFLOZIN 10 MG PO TABS
10.0000 mg | ORAL_TABLET | Freq: Every day | ORAL | 0 refills | Status: AC
  Filled 2023-05-12: qty 30, 30d supply, fill #0

## 2023-05-12 MED ORDER — NYSTATIN 100000 UNIT/ML MT SUSP
5.0000 mL | Freq: Four times a day (QID) | OROMUCOSAL | 0 refills | Status: DC
  Filled 2023-05-12: qty 120, 6d supply, fill #0

## 2023-05-12 MED ORDER — EMPAGLIFLOZIN 10 MG PO TABS
10.0000 mg | ORAL_TABLET | Freq: Every day | ORAL | Status: DC
  Administered 2023-05-12: 10 mg via ORAL
  Filled 2023-05-12: qty 1

## 2023-05-12 MED ORDER — LOSARTAN POTASSIUM 25 MG PO TABS
25.0000 mg | ORAL_TABLET | Freq: Every day | ORAL | 0 refills | Status: AC
  Filled 2023-05-12: qty 30, 30d supply, fill #0

## 2023-05-12 MED ORDER — APIXABAN (ELIQUIS) VTE STARTER PACK (10MG AND 5MG)
ORAL_TABLET | ORAL | 0 refills | Status: DC
  Filled 2023-05-12: qty 74, 30d supply, fill #0

## 2023-05-12 MED ORDER — SULFAMETHOXAZOLE-TRIMETHOPRIM 800-160 MG PO TABS
1.0000 | ORAL_TABLET | Freq: Once | ORAL | Status: DC
  Filled 2023-05-12: qty 1

## 2023-05-12 MED ORDER — MIRTAZAPINE 15 MG PO TBDP
7.5000 mg | ORAL_TABLET | Freq: Every day | ORAL | 0 refills | Status: DC
Start: 2023-05-12 — End: 2023-12-02
  Filled 2023-05-12: qty 15, 30d supply, fill #0

## 2023-05-12 NOTE — Progress Notes (Signed)
 [facility]  Subjective  -planning discharge today   Objective (!) 150/50 67 97.6 F (36.4 C) (Oral) 16 96% No intake or output data in the 24 hours ending 05/12/23 1114   SURGICAL PATHOLOGY CASE: [id number] PATIENT: [facility] Surgical Pathology Report     Clinical History: left eye visual loss (cm)     FINAL MICROSCOPIC DIAGNOSIS:  A. TEMPORAL ARTERY, LEFT, BIOPSY: - Giant cell arteritis.  B. TEMPORAL ARTERY, LEFT #2, BIOPSY: - Giant cell arteritis.  Comment: These findings were communicated to Felipa Horsfall for Dr. Fulton Job on 05/12/2023.   GROSS DESCRIPTION:  A: Specimen is received fresh and consists of a tubular segment of tan-red soft tissue, measuring 1.0 cm in length by 0.2 cm in diameter. The specimen is trisected and entirely submitted in 1 cassette.  B: The specimen is received fresh and consists of a branching portion of tan-red tubular soft tissue, overall measuring 2.5 cm in length and up to 0.4 cm in diameter.  The specimen is serially sectioned and entirely submitted in 2 cassettes.  Jeffrey Mini 05/09/2023)  Final Diagnosis performed by Teresia Fennel, MD.   Electronically signed 05/12/2023 Technical component performed at [facility]. [facility], [address] [place], [place] [address].  Professional component performed at [facility]. [address] [place], [place] [address]  Immunohistochemistry Technical component (if applicable) was performed at [facility]. [address], [address], [place], [place] [address].  IMMUNOHISTOCHEMISTRY DISCLAIMER (if applicable): Some of these immunohistochemical stains may have been developed and the performance characteristics determine by [facility]. Some may not have been cleared or approved by the U.S. Food and Drug Administration. The FDA has determined that such clearance or approval is not  necessary. This test is used for clinical purposes. It should not be regarded as investigational or for research. This laboratory is certified under the Clinical Laboratory Improvement Amendments of 1988 (CLIA-88) as qualified to perform high complexity clinical laboratory testing.  The controls stained appropriately.    Laboratory Lab Results: Recent Labs    05/11/23 0646 05/12/23 0438  WBC 16.8* 13.3*  HGB 10.0* 10.7*  HCT 31.8* 33.5*  PLT 389 407*   BMET Recent Labs    05/11/23 0646 05/12/23 0438  NA 139 140  K 4.1 3.9  CL 104 108  CO2 25 26  GLUCOSE 144* 145*  BUN 21 18  CREATININE 0.66 0.68  CALCIUM 8.2* 8.2*    COAG Lab Results  Component Value Date   INR 1.3 (H) 05/08/2023   INR 0.97 09/15/2015   INR 1.05 01/24/2015   No results found for: "PTT"  Assessment/Planning:  89 year old female underwent left temporal artery biopsy on Friday.  Discussed with family at bedside that the temporal area biopsy was positive and I was notified by the pathologist today.  Patient will be discharged on steroids.  I have discussed with hospitalist and will arrange follow-up with rheumatology and her ophthalmologist as an outpatient.  Young Hensen 05/12/2023 11:14 AM --

## 2023-05-12 NOTE — Progress Notes (Signed)
 Physical Therapy Treatment Patient Details Name: [name] MRN: [id number] DOB: [birth year] Today's Date: 05/12/2023   History of Present Illness 89 y.o. 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 PMH: allergies to ampicillin, adhesives elastic bandages sulfa antibiotics and latex, prediabetes, hypothyroidism, essential hypertension, history of shingles.    PT Comments  Progressing quite well, ambulating up to 150 feet today with improved stability and pace while using RW for support. SpO2 97% on room air with activity. Reviewed LE exercises. Family present and supportive. Patient will continue to benefit from skilled physical therapy services to further improve independence with functional mobility.     If plan is discharge home, recommend the following: A little help with walking and/or transfers;A little help with bathing/dressing/bathroom;Assistance with cooking/housework;Assist for transportation;Help with stairs or ramp for entrance   Can travel by private vehicle        Equipment Recommendations  None recommended by PT    Recommendations for Other Services       Precautions / Restrictions Precautions Precautions: Fall Restrictions Weight Bearing Restrictions Per Provider Order: No     Mobility  Bed Mobility               General bed mobility comments: Standing at sink with OT when PT entered room.    Transfers Overall transfer level: Needs assistance Equipment used: Rolling walker (2 wheels) Transfers: Sit to/from Stand Sit to Stand: Supervision           General transfer comment: Supervision for safety from standard chair to rise. No LOB with RW for support.    Ambulation/Gait Ambulation/Gait assistance: Supervision Gait Distance (Feet): 150 Feet Assistive device: Rolling walker (2 wheels) Gait Pattern/deviations: Step-through pattern,  Decreased stride length, Trunk flexed Gait velocity: dec Gait velocity interpretation: <1.8 ft/sec, indicate of risk for recurrent falls   General Gait Details: Cues for upright posture, forward gaze and larger step length. No LOB. Pt tolerated well with RW for support, increased distance today. Mild dyspnea, SpO2 97% on RA. Stable with RW   Stairs             Wheelchair Mobility     Tilt Bed    Modified Rankin (Stroke Patients Only)       Balance Overall balance assessment: Needs assistance Sitting-balance support: No upper extremity supported, Feet supported Sitting balance-Leahy Scale: Fair     Standing balance support: No upper extremity supported, During functional activity Standing balance-Leahy Scale: Fair                              Hotel manager: No apparent difficulties  Cognition Arousal: Alert Behavior During Therapy: WFL for tasks assessed/performed   PT - Cognitive impairments: No apparent impairments                         Following commands: Intact      Cueing Cueing Techniques: Verbal cues  Exercises General Exercises - Lower Extremity Ankle Circles/Pumps: PROM, Both, 15 reps, Seated Quad Sets: Strengthening, Both, 10 reps, Seated Gluteal Sets: Strengthening, Both, 10 reps, Seated Hip ABduction/ADduction: Strengthening, Both, Seated, 10 reps    General Comments General comments (skin integrity, edema, etc.): VSS      Pertinent Vitals/Pain Pain Assessment Pain Assessment: Faces Faces Pain Scale: Hurts even more Pain Location: bil temples Pain  Descriptors / Indicators: Aching    Home Living                          Prior Function            PT Goals (current goals can now be found in the care plan section) Acute Rehab PT Goals Patient Stated Goal: Go home PT Goal Formulation: With patient/family Time For Goal Achievement: 05/24/23 Potential to Achieve Goals:  Good Progress towards PT goals: Progressing toward goals    Frequency    Min 2X/week      PT Plan      Co-evaluation              AM-PAC PT "6 Clicks" Mobility   Outcome Measure  Help needed turning from your back to your side while in a flat bed without using bedrails?: None Help needed moving from lying on your back to sitting on the side of a flat bed without using bedrails?: A Little Help needed moving to and from a bed to a chair (including a wheelchair)?: A Little Help needed standing up from a chair using your arms (e.g., wheelchair or bedside chair)?: A Little Help needed to walk in hospital room?: A Little Help needed climbing 3-5 steps with a railing? : A Little 6 Click Score: 19    End of Session Equipment Utilized During Treatment: Gait belt Activity Tolerance: Patient tolerated treatment well Patient left: with call bell/phone within reach;with family/visitor present;in chair;with chair alarm set   PT Visit Diagnosis: Unsteadiness on feet (R26.81);Other abnormalities of gait and mobility (R26.89);Muscle weakness (generalized) (M62.81)     Time: 6045-4098 PT Time Calculation (min) (ACUTE ONLY): 14 min  Charges:    $Gait Training: 8-22 mins PT General Charges $$ ACUTE PT VISIT: 1 Visit                     Jory Ng, PT, DPT [facility]  Rehabilitation Services Physical Therapist Office: [phone] Website: [email or web address]    Alinda Irani 05/12/2023, 11:13 AM

## 2023-05-12 NOTE — Telephone Encounter (Signed)
 LVM stating that patient had ct done due to being hospitalized.  Please advise.

## 2023-05-12 NOTE — Discharge Summary (Signed)
 Physician Discharge Summary  [name]:[id number] DOB: [birth year] DOA: 05/08/2023  PCP: Jimmey Mould, MD  Admit date: 05/08/2023 Discharge date: 05/12/2023  Admitted From: (Home) Disposition:  (Home )  Recommendations for Outpatient Follow-up:  Follow up with PCP next week Please obtain BMP/CBC in one week Patient will need outpatient follow-up with rheumatology, ambulatory referral has been sent, please have PCP try to arrange as well if they can arrange for sooner appointment - Patient will need routine follow-up with GYN as an outpatient regarding findings of bilateral ovarian cysts - Patient will need to follow-up with her ophthalmology, rheumatology, regarding taper of her steroids for temporal arteritis - [facility] cardiology to arrange for outpatient follow-up regarding new finding of low EF and CAD - Consider starting statin once LFT normalize - Patient has been started on Bactrim DS 3 times weekly in the setting of anticipation chronic high-dose steroids - Patient started on multiple medication this admission, please monitor closely labs, vital signs and adjust as needed Consider starting statin when liver enzymes within normal limit Uptitrate her mirtazapine as it seems to be helping with her poor appetite and insomnia   Home Health: (YES)  Diet recommendation: Heart Healthy / Carb Modified  Brief/Interim Summary:   [name] is a 89 y.o. 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  Inflammatory markers are elevated, would support this diagnosis.  She went for temporal artery biopsy, which came back confirmatory of temporal arteritis.  Left eye vision loss Temporal arteritis/giant cell  arteritis -Presents with vision loss in left eye, significantly inflammatory markers, including CRP, ESR and D-dimers -She was treated with IV Solu-Medrol  100 mg IV 3 times daily, then she started on p.o. prednisone  with anticipation of prolonged taper, she will be discharged on prednisone  60 mg p.o. daily with anticipated gradual taper under supervision of ophthalmology and rheumatology. -Status post biopsy by vascular surgery, confirmatory for temporal arteritis -started on Protonix  for GI prophylaxis  acute systolic/diastolic CHF Left bundle branch block on EKG CAD as evidenced on CTA chest Hyperlipidemia -The echo significant for EF 40%, with global hypokinesis, worse on the septum, and septal lateral desynchrony, assistant with left bundle branch block as well grade 1 diastolic dysfunction -she does not appear to be in any volume overload, no previous echo to compare -She will need further workup by cardiology once more stable, likely as an outpatient. - As well there is evidence of three-vessel disease on her CTA chest, LDL is elevated. - Will hold on starting aspirin  that she is on Eliquis currently. - She will be discharged on low-dose Coreg, losartan, and Jardiance -CT [facility] cardiology to arrange for outpatient follow-up as discussed with cardiology coordinator - LDL is elevated at 121, but given her transaminitis we will hold on starting statin, but this can be considered as an outpatient once her transaminitis has resolved  Elevated D-dimers Bilateral DVT and PE -Started on heparin  GTT, no evidence of right heart strain on echo or CTA chest, transitioned to Eliquis   hypothyroidism -Continue with home dose Synthroid  -Both TSH, and free T4 is elevated, so I will keep current dose as these lab abnormalities most likely in the setting of sick euthyroid syndrome.   Transaminitis -most likely in the setting of inflammatory response as all inflammatory markers  are elevated, right upper  quadrant ultrasound, with no acute findings, hepatitis panel was negative   Dysphagia Weight loss Bilateral ovarian cyst - CT neck, chest, abdomen pelvis obtained to rule out malignancy, main significant finding is bilateral ovarian cysts, discussed with patient, daughter at bedside, she will follow-up with GYN as an outpatient regarding that.   Failure to thrive - Significant weight loss, weakness, deconditioning, movement significantly low at 2, will consult nutritionist and PT/OT   Insomnia, poor appetite, depression - Started on mirtazapine  Prediabetes -A1c 6.3, CBG started to increase as she is on steroids, she was started on Jardiance     Discharge Diagnoses:  Principal Problem:   Vision loss of left eye Active Problems:   DVT (deep venous thrombosis) (HCC)   Hyperlipidemia   Acute pulmonary embolism (HCC)   Giant cell arteritis (HCC)   CAD (coronary artery disease)   LBBB (left bundle branch block)   Acute systolic CHF (congestive heart failure) [facility])    Discharge Instructions  Discharge Instructions     Ambulatory referral to Rheumatology   Complete by: As directed    Diet - low sodium heart healthy   Complete by: As directed    Increase activity slowly   Complete by: As directed       Allergies as of 05/12/2023       Reactions   Ampicillin Cough   Has patient had a PCN reaction causing immediate rash, facial/tongue/throat swelling, SOB or lightheadedness with hypotension: No Has patient had a PCN reaction causing severe rash involving mucus membranes or skin necrosis: No Has patient had a PCN reaction that required hospitalization No Has patient had a PCN reaction occurring within the last 10 years: No If all of the above answers are "NO", then may proceed with Cephalosporin use.   Elastic Bandages & [zinc]    Sulfa Antibiotics Cough   Latex Rash        Medication List     TAKE these medications    acetaminophen  500 MG tablet Commonly known  as: TYLENOL  Take 500 mg by mouth every 6 (six) hours as needed for fever, moderate pain (pain score 4-6) or mild pain (pain score 1-3).   Biotin 1000 MCG tablet Take 1,000 mcg by mouth at bedtime.   CALCIUM MAGNESIUM  ZINC PO Take 1 tablet by mouth at bedtime.   CALENDULA EX Apply 1 Application topically as needed.   carvedilol 3.125 MG tablet Commonly known as: COREG Take 1 tablet (3.125 mg total) by mouth 2 (two) times daily with a meal.   docusate sodium  100 MG capsule Commonly known as: COLACE Take 1 capsule (100 mg total) by mouth 2 (two) times daily as needed for mild constipation.   Eliquis DVT/PE Starter Pack Generic drug: Apixaban Starter Pack (10mg  and 5mg ) Take as directed on package: start with two-5mg  tablets twice daily for 7 days. On day 8, switch to one-5mg  tablet twice daily.   Jardiance 10 MG Tabs tablet Generic drug: empagliflozin Take 1 tablet (10 mg total) by mouth daily.   levothyroxine  75 MCG tablet Commonly known as: SYNTHROID  Take 75 mcg by mouth daily before breakfast. TAKE 1 TABLET IN THE MORNING ON AN EMPTY STOMACH MON-SAT AND 1/2 TABLET ON SUNDAY   losartan 25 MG tablet Commonly known as: COZAAR Take 1 tablet (25 mg total) by mouth daily. Start taking on: May 13, 2023   MAGNESIUM  PO Take 1 tablet by mouth at bedtime.   melatonin 5 MG Tabs Take  1 tablet (5 mg total) by mouth at bedtime.   mirtazapine 15 MG disintegrating tablet Commonly known as: REMERON SOL-TAB Dissolve 0.5 tablets (7.5 mg total) under the tongue at bedtime.   nystatin 100000 UNIT/ML suspension Commonly known as: MYCOSTATIN Take 5 mLs (500,000 Units total) by mouth 4 (four) times daily.   pantoprazole  40 MG tablet Commonly known as: PROTONIX  Take 1 tablet (40 mg total) by mouth daily. Start taking on: May 13, 2023   predniSONE  20 MG tablet Commonly known as: DELTASONE  Take 3 tablets (60 mg total) by mouth daily with breakfast. Start taking on: May 13, 2023    sulfamethoxazole-trimethoprim 800-160 MG tablet Commonly known as: Bactrim DS Take 1 tablet by mouth 3 (three) times a week.   SYSTANE OP Apply 1 drop to eye as needed. Both eyes   VITAMIN D -3 PO Take 1 tablet by mouth at bedtime.        Follow-up Information     Jimmey Mould, MD Follow up in 1 week(s).   Specialty: Family Medicine Contact information: [address] [place] [address] [phone]         [facility] Follow up.   Specialty: Home Health Services Why: [facility] will contact you within 48 hours of DC to arrange a home visit for physical and occupational therapies Contact information: [address] [place] [address] [phone]                Allergies  Allergen Reactions   Ampicillin Cough    Has patient had a PCN reaction causing immediate rash, facial/tongue/throat swelling, SOB or lightheadedness with hypotension: No Has patient had a PCN reaction causing severe rash involving mucus membranes or skin necrosis: No Has patient had a PCN reaction that required hospitalization No Has patient had a PCN reaction occurring within the last 10 years: No If all of the above answers are "NO", then may proceed with Cephalosporin use.   Elastic Bandages & [Zinc]    Sulfa Antibiotics Cough   Latex Rash    Consultations: Vascular surgery neurology   Procedures/Studies: VAS US  LOWER EXTREMITY VENOUS (DVT) Result Date: 05/10/2023  Lower Venous DVT Study Patient Name:  DHWANI MARSCHALL  Date of Exam:   05/10/2023 Medical Rec #: [id number]           Accession #:    [id number] Date of Birth: 05-05-1933           Patient Gender: F Patient Age:   56 years Exam Location:  [facility] Procedure:      VAS US  LOWER EXTREMITY VENOUS (DVT) Referring Phys: Seena Dadds --------------------------------------------------------------------------------  Indications: Pulmonary embolism.  Limitations: Pain and guarding.  Comparison Study: 01/05/2016 Lower extremity venous duplex Performing Technologist: Delford Felling MHA, RDMS, RVT, RDCS  Examination Guidelines: A complete evaluation includes B-mode imaging, spectral Doppler, color Doppler, and power Doppler as needed of all accessible portions of each vessel. Bilateral testing is considered an integral part of a complete examination. Limited examinations for reoccurring indications may be performed as noted. The reflux portion of the exam is performed with the patient in reverse Trendelenburg.  +---------+---------------+---------+-----------+----------+--------------+ RIGHT    CompressibilityPhasicitySpontaneityPropertiesThrombus Aging +---------+---------------+---------+-----------+----------+--------------+ CFV      Full           Yes      Yes                                 +---------+---------------+---------+-----------+----------+--------------+  SFJ      Full                                                        +---------+---------------+---------+-----------+----------+--------------+ FV Prox  Full                                                        +---------+---------------+---------+-----------+----------+--------------+ FV Mid   Full           Yes      Yes                                 +---------+---------------+---------+-----------+----------+--------------+ FV DistalFull                                                        +---------+---------------+---------+-----------+----------+--------------+ PFV      Full                                                        +---------+---------------+---------+-----------+----------+--------------+ POP      Full           Yes      Yes                                 +---------+---------------+---------+-----------+----------+--------------+ PTV      None                    No                   Acute           +---------+---------------+---------+-----------+----------+--------------+ PERO     None                    No                   Acute          +---------+---------------+---------+-----------+----------+--------------+   +---------+---------------+---------+-----------+----------+--------------+ LEFT     CompressibilityPhasicitySpontaneityPropertiesThrombus Aging +---------+---------------+---------+-----------+----------+--------------+ CFV      Full           Yes      Yes                                 +---------+---------------+---------+-----------+----------+--------------+ SFJ      Full                                                        +---------+---------------+---------+-----------+----------+--------------+ FV  Prox  Full                                                        +---------+---------------+---------+-----------+----------+--------------+ FV Mid   Full           Yes      Yes                                 +---------+---------------+---------+-----------+----------+--------------+ FV DistalFull                                                        +---------+---------------+---------+-----------+----------+--------------+ PFV      Full                                                        +---------+---------------+---------+-----------+----------+--------------+ POP      Full           Yes      Yes                                 +---------+---------------+---------+-----------+----------+--------------+ PTV      None                    No                   Acute          +---------+---------------+---------+-----------+----------+--------------+ PERO     None                    No                   Acute          +---------+---------------+---------+-----------+----------+--------------+     Summary: RIGHT: - Findings consistent with acute deep vein thrombosis involving the right posterior tibial veins, and  right peroneal veins.  - No cystic structure found in the popliteal fossa.  LEFT: - Findings consistent with acute deep vein thrombosis involving the left peroneal veins, and left posterior tibial veins.  - No cystic structure found in the popliteal fossa.  *See table(s) above for measurements and observations. Electronically signed by Delaney Fearing on 05/10/2023 at 11:38:56 AM.    Final    CT SOFT TISSUE NECK W CONTRAST Result Date: 05/09/2023 CLINICAL DATA:  Difficulty swallowing. Enlarged and painful cervical lymph noted. EXAM: CT NECK WITH CONTRAST TECHNIQUE: Multidetector CT imaging of the neck was performed using the standard protocol following the bolus administration of intravenous contrast. RADIATION DOSE REDUCTION: This exam was performed according to the departmental dose-optimization program which includes automated exposure control, adjustment of the mA and/or kV according to patient size and/or use of iterative reconstruction technique. CONTRAST:  75mL OMNIPAQUE IOHEXOL 350 MG/ML SOLN COMPARISON:  None available FINDINGS: Pharynx and larynx: No focal mucosal or submucosal lesions are present. The nasopharynx is clear.  The soft palate and tongue base are normal. Oropharynx is unremarkable. Vallecula and epiglottis are within normal limits. Aryepiglottic folds and piriform sinuses are clear. Vocal cords are midline and symmetric. Trachea is clear. Salivary glands: The submandibular and parotid glands and ducts are within normal limits. Thyroid : Normal Lymph nodes: Normal Vascular: Atherosclerotic calcifications are present at the carotid bifurcations bilaterally. No significant stenosis is present on the right. The left ICA lumen narrowed to 2.4 mm this compares to more distal measurement of 4.8 mm. Limited intracranial: Within normal limits. Visualized orbits: Bilateral lens replacements are noted. Globes and orbits are otherwise unremarkable. Mastoids and visualized paranasal sinuses: The paranasal  sinuses and mastoid air cells are clear. Skeleton: Ankylosis is present cross the disc space at C5-6. Posterior elements are fused at C4-5. Adjacent level disease is present C5-6 with facet spurring bilaterally. Mild left foraminal stenosis is present. No focal osseous lesions are present. Upper chest: The lung apices are clear. The thoracic inlet is within normal limits. IMPRESSION: 1. No acute or focal lesion to explain the patient's symptoms. 2. No adenopathy or mass. 3. Atherosclerotic calcifications at the carotid bifurcations bilaterally. 4. Ankylosis across the disc space at C5-6. 5. Posterior elements are fused at C4-5. 6. Adjacent level disease at C5-6 with facet spurring bilaterally. Mild left foraminal stenosis is present. Electronically Signed   By: Audree Leas M.D.   On: 05/09/2023 18:17   CT ABDOMEN PELVIS W CONTRAST Result Date: 05/09/2023 CLINICAL DATA:  weight loss, elevated D Dimer, difficulty swallowing, rule out , malignancy , PE EXAM: CT ABDOMEN AND PELVIS WITH CONTRAST TECHNIQUE: Multidetector CT imaging of the abdomen and pelvis was performed using the standard protocol following bolus administration of intravenous contrast. RADIATION DOSE REDUCTION: This exam was performed according to the departmental dose-optimization program which includes automated exposure control, adjustment of the mA and/or kV according to patient size and/or use of iterative reconstruction technique. CONTRAST:  75mL OMNIPAQUE IOHEXOL 350 MG/ML SOLN COMPARISON:  None Available. FINDINGS: Lower chest: See chest CT report today. Hepatobiliary: No focal liver abnormality is seen. Status post cholecystectomy. No biliary dilatation. Pancreas: No focal abnormality or ductal dilatation. Spleen: No focal abnormality.  Normal size. Adrenals/Urinary Tract: No suspicious renal or adrenal abnormality/lesion. No stones or hydronephrosis. Urinary bladder unremarkable. Stomach/Bowel: Sigmoid diverticulosis. No active  diverticulitis. Stomach and small bowel decompressed. No bowel obstruction or inflammatory process. Vascular/Lymphatic: Aortic atherosclerosis. No evidence of aneurysm or adenopathy. Reproductive: Calcifications in the uterus, likely small calcified fibroids. 4.7 cm right ovarian cyst. 1.2 cm left ovarian cyst. Other: No free fluid or free air. Musculoskeletal: No acute bony abnormality. IMPRESSION: No acute findings in the abdomen or pelvis. Sigmoid diverticulosis.  No active diverticulitis. Bilateral ovarian cysts, the largest 4.7 cm in the right ovary. Recommend follow-up US  in 6-12 months. Note: This recommendation does not apply to premenarchal patients and to those with increased risk (genetic, family history, elevated tumor markers or other high-risk factors) of ovarian cancer. Reference: JACR 2020 Feb; 17(2):248-254 Aortic atherosclerosis. Electronically Signed   By: Janeece Mechanic M.D.   On: 05/09/2023 17:44   CT Angio Chest Pulmonary Embolism (PE) W or WO Contrast Result Date: 05/09/2023 CLINICAL DATA:  weight loss, elevated D Dimer, difficulty swallowing, rule out malignancy , PE EXAM: CT ANGIOGRAPHY CHEST WITH CONTRAST TECHNIQUE: Multidetector CT imaging of the chest was performed using the standard protocol during bolus administration of intravenous contrast. Multiplanar CT image reconstructions and MIPs were obtained to evaluate the vascular anatomy. RADIATION  DOSE REDUCTION: This exam was performed according to the departmental dose-optimization program which includes automated exposure control, adjustment of the mA and/or kV according to patient size and/or use of iterative reconstruction technique. CONTRAST:  75mL OMNIPAQUE IOHEXOL 350 MG/ML SOLN COMPARISON:  None Available. FINDINGS: Cardiovascular: Filling defects in the posterior right lower lobe pulmonary arterial branches compatible with pulmonary emboli. No evidence of right heart strain. Mild cardiomegaly. Three-vessel coronary artery  disease. Moderate aortic atherosclerosis. No evidence of aortic aneurysm. Mediastinum/Nodes: No mediastinal, hilar, or axillary adenopathy. Trachea and esophagus are unremarkable. Thyroid  unremarkable. Lungs/Pleura: Dependent and bibasilar atelectasis. Trace bilateral pleural effusions. Upper Abdomen: See abdominal CT report Musculoskeletal: Chest wall soft tissues are unremarkable. No acute bony abnormality. Diffuse degenerative changes throughout the thoracic spine. Review of the MIP images confirms the above findings. IMPRESSION: Posterior right lower lobe segmental pulmonary emboli. No evidence of right heart strain. Cardiomegaly, three-vessel coronary artery disease. Dependent and bibasilar atelectasis. Trace bilateral pleural effusions. Aortic Atherosclerosis (ICD10-I70.0). These results will be called to the ordering clinician or representative by the Radiologist Assistant, and communication documented in the PACS or [employer]. Electronically Signed   By: Janeece Mechanic M.D.   On: 05/09/2023 17:41   MR BRAIN WO CONTRAST Result Date: 05/08/2023 CLINICAL DATA:  Initial evaluation for possible PCA infarct. EXAM: MRI HEAD WITHOUT CONTRAST TECHNIQUE: Multiplanar, multiecho pulse sequences of the brain and surrounding structures were obtained without intravenous contrast. COMPARISON:  Prior MRA from earlier the same day as well as prior MRI from 08/07/2017. FINDINGS: Brain: Cerebral volume within normal limits for age. Scattered patchy T2/FLAIR hyperintensity involving the periventricular deep white matter both cerebral hemispheres, most characteristic of chronic microvascular ischemic disease. No abnormal foci of restricted diffusion to suggest acute or subacute ischemia. No areas of chronic cortical infarction. Apparent diffuse FLAIR hyperintensity seen throughout the subarachnoid spaces on FLAIR sequence noted (series 11, image 25 for example), favored to be artifactual as no signal changes are seen on  corresponding sequences. No associated susceptibility artifact to suggest hemorrhage. No other acute or chronic intracranial blood products. No mass lesion, midline shift or mass effect. No hydrocephalus or extra-axial fluid collection. Pituitary gland within normal limits. Vascular: Major intracranial vascular flow voids are maintained by MRI. Skull and upper cervical spine: Degenerative thickening about the tectorial membrane with mild stenosis at the cervicomedullary junction (series 9, image 12). Bone marrow signal intensity within normal limits. No scalp soft tissue abnormality. Sinuses/Orbits: Prior bilateral ocular lens replacement. Paranasal sinuses are largely clear. Trace bilateral mastoid effusions noted, bowel significance. Other: None. IMPRESSION: 1. Apparent diffusely increased FLAIR hyperintensity throughout the subarachnoid spaces as above, felt to be most consistent with artifact on this exam. Sequelae of meningitis would be the primary differential consideration, although again, this is strongly favored to be artifactual in nature. 2. No other acute intracranial abnormality. No evidence for acute or chronic right PCA distribution infarct. 3. Mild chronic microvascular ischemic disease for age. Electronically Signed   By: Virgia Griffins M.D.   On: 05/08/2023 21:52   US  Abdomen Limited RUQ (LIVER/GB) Result Date: 05/08/2023 EXAM: Right Upper Quadrant Abdominal Ultrasound TECHNIQUE: Real-time ultrasonography of the right upper quadrant of the abdomen was performed. COMPARISON: None. CLINICAL HISTORY: Abnormal LFTs FINDINGS: LIVER: The liver demonstrates normal echogenicity. No evidence of intrahepatic biliary ductal dilatation. No evidence of mass. BILIARY SYSTEM: Status post cholecystectomy. Common bile duct measures 8 mm, within the upper limits of normal. OTHER: No evidence of right upper quadrant ascites. IMPRESSION:  1. Status post cholecystectomy. 2. Otherwise unremarkable. Electronically  signed by: Zadie Herter MD 05/08/2023 08:57 PM EDT RP Workstation: [id number]   ECHOCARDIOGRAM COMPLETE BUBBLE STUDY Result Date: 05/08/2023    ECHOCARDIOGRAM REPORT   Patient Name:   KALLE WIEBOLD Date of Exam: 05/08/2023 Medical Rec #:  [id number]          Height:       57.0 in Accession #:    [id number]         Weight:       139.0 lb Date of Birth:  [birth year]          BSA:          1.541 m Patient Age:    89 years           BP:           133/54 mmHg Patient Gender: F                  HR:           76 bpm. Exam Location:  Inpatient Procedure: 2D Echo, Cardiac Doppler, Color Doppler and Saline Contrast Bubble            Study (Both Spectral and Color Flow Doppler were utilized during            procedure). Indications:    Stroke 434.91 / I63.9  History:        Patient has no prior history of Echocardiogram examinations.                 Risk Factors:Dyslipidemia.  Sonographer:    Terrilee Few RCS Referring Phys: [phone] EKTA V PATEL IMPRESSIONS  1. Normal left ventricular size. Left ventricular ejection fraction, by estimation, is 40%. The left ventricle has mild to moderately decreased function. The left ventricle demonstrates global hypokinesis worse in the septum, septal-lateral dyssynchrony  consistent with LBBB. Left ventricular diastolic parameters are consistent with Grade I diastolic dysfunction (impaired relaxation).  2. Right ventricular systolic function is normal. The right ventricular size is normal. There is normal pulmonary artery systolic pressure. The estimated right ventricular systolic pressure is 16.0 mmHg.  3. Bubble study negative, no PFO or ASD.  4. The mitral valve is degenerative. Mild mitral valve regurgitation. No evidence of mitral stenosis. Moderate mitral annular calcification.  5. The aortic valve is tricuspid. Aortic valve regurgitation is mild. No aortic stenosis is present.  6. The inferior vena cava is normal in size with greater than 50% respiratory variability,  suggesting right atrial pressure of 3 mmHg. FINDINGS  Left Ventricle: Left ventricular ejection fraction, by estimation, is 40%. The left ventricle has mild to moderately decreased function. The left ventricle demonstrates global hypokinesis. The left ventricular internal cavity size was normal in size. There is no left ventricular hypertrophy. Left ventricular diastolic parameters are consistent with Grade I diastolic dysfunction (impaired relaxation). Right Ventricle: The right ventricular size is normal. No increase in right ventricular wall thickness. Right ventricular systolic function is normal. There is normal pulmonary artery systolic pressure. The tricuspid regurgitant velocity is 1.80 m/s, and  with an assumed right atrial pressure of 3 mmHg, the estimated right ventricular systolic pressure is 16.0 mmHg. Left Atrium: Left atrial size was normal in size. Right Atrium: Right atrial size was normal in size. Pericardium: There is no evidence of pericardial effusion. Mitral Valve: The mitral valve is degenerative in appearance. There is mild calcification of the mitral valve leaflet(s). Moderate mitral annular calcification.  Mild mitral valve regurgitation. No evidence of mitral valve stenosis. Tricuspid Valve: The tricuspid valve is normal in structure. Tricuspid valve regurgitation is trivial. Aortic Valve: The aortic valve is tricuspid. Aortic valve regurgitation is mild. Aortic regurgitation PHT measures 597 msec. No aortic stenosis is present. Aortic valve peak gradient measures 5.7 mmHg. Pulmonic Valve: The pulmonic valve was normal in structure. Pulmonic valve regurgitation is not visualized. Aorta: The aortic root is normal in size and structure. Venous: The inferior vena cava is normal in size with greater than 50% respiratory variability, suggesting right atrial pressure of 3 mmHg. IAS/Shunts: Bubble study negative, no PFO or ASD. Agitated saline contrast was given intravenously to evaluate for  intracardiac shunting.  LEFT VENTRICLE PLAX 2D LVIDd:         4.10 cm   Diastology LVIDs:         2.90 cm   LV e' medial:    6.20 cm/s LV PW:         1.00 cm   LV E/e' medial:  14.2 LV IVS:        0.60 cm   LV e' lateral:   5.22 cm/s LVOT diam:     2.00 cm   LV E/e' lateral: 16.9 LV SV:         52 LV SV Index:   34 LVOT Area:     3.14 cm  RIGHT VENTRICLE             IVC RV S prime:     10.40 cm/s  IVC diam: 1.40 cm TAPSE (M-mode): 2.0 cm LEFT ATRIUM           Index        RIGHT ATRIUM           Index LA diam:      3.00 cm 1.95 cm/m   RA Area:     10.20 cm LA Vol (A2C): 37.8 ml 24.53 ml/m  RA Volume:   20.40 ml  13.24 ml/m LA Vol (A4C): 24.7 ml 16.03 ml/m  AORTIC VALVE AV Area (Vmax): 2.31 cm AV Vmax:        119.00 cm/s AV Peak Grad:   5.7 mmHg LVOT Vmax:      87.60 cm/s LVOT Vmean:     55.400 cm/s LVOT VTI:       0.165 m AI PHT:         597 msec  AORTA Ao Root diam: 2.50 cm Ao Asc diam:  3.30 cm MITRAL VALVE                TRICUSPID VALVE MV Area (PHT): 3.85 cm     TR Peak grad:   13.0 mmHg MV Decel Time: 197 msec     TR Vmax:        180.00 cm/s MR Peak grad: 24.4 mmHg MR Vmax:      247.00 cm/s   SHUNTS MV E velocity: 88.00 cm/s   Systemic VTI:  0.16 m MV A velocity: 141.00 cm/s  Systemic Diam: 2.00 cm MV E/A ratio:  0.62 Dalton McleanMD Electronically signed by Archer Bear Signature Date/Time: 05/08/2023/4:28:30 PM    Final    DG Chest 2 View Result Date: 05/08/2023 CLINICAL DATA:  Cough, visual changes, temporal arteritis EXAM: CHEST - 2 VIEW COMPARISON:  None Available. FINDINGS: Frontal and lateral views of the chest demonstrate an enlarged cardiac silhouette. There is ectasia and atherosclerosis of the thoracic aorta. No acute airspace disease, effusion, or pneumothorax. No  acute bony abnormalities. IMPRESSION: 1. Enlarged cardiac silhouette. 2. No acute airspace disease. 3. Atherosclerosis and ectasia of the thoracic aorta. Electronically Signed   By: Bobbye Burrow M.D.   On: 05/08/2023 14:48    MR ANGIO HEAD WO W CONTRAST Result Date: 05/08/2023 CLINICAL DATA:  89 year old female with headache and vision changes. Possible temporal arteritis. EXAM: MRA HEAD WITHOUT AND WITH CONTRAST TECHNIQUE: Multiplanar, multi-echo pulse sequences of the brain and surrounding structures were acquired without intravenous contrast. Angiographic images of the Circle of Willis were acquired using MRA technique without intravenous contrast. COMPARISON:  Brain MRI 08/07/2017. FINDINGS: Anterior circulation: Antegrade flow in both ICA siphons. No siphon stenosis. Patent ophthalmic artery origins. Patent carotid termini. Patent MCA and ACA origins. Non dominant right A1. Anterior communicating artery and visible bilateral ACA branches are within normal limits. Left MCA M1 segment, bifurcation, left MCA branches are within normal limits. Right MCA M1 segment, trifurcation, visible right MCA branches are within normal limits. Posterior circulation: Pre contrast images demonstrate antegrade flow signal in codominant distal vertebral arteries, vertebrobasilar junction, basilar artery. SCA, AICA, left PICA origins appear normal. Left PCA is within normal limits. Right PCA is occluded at the P1, P2 junction (series 2, image 85). Posterior communicating arteries are diminutive or absent. Anatomic variants: Dominant left ACA A1 segment. Other findings: No evidence of intracranial mass effect or ventriculomegaly. No encephalomalacia evident in the right PCA territory. Following contrast the visible major dural venous sinuses also appear to be patent. Bilateral temporal artery region neurovascular bundles appear symmetric. No abnormal intracranial enhancement identified. IMPRESSION: 1. 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 This is age indeterminate. No obvious cerebral edema or encephalomalacia in that vascular territory. 2. Otherwise negative for age intracranial MRA. 3. Temporal artery biopsy (TAB) is the gold  standard in diagnosing giant cell arteritis. Electronically Signed   By: Marlise Simpers M.D.   On: 05/08/2023 12:52      Subjective:  She had a good night sleep, appetite has improved Discharge Exam: Vitals:   05/12/23 0547 05/12/23 0836  BP: (!) 158/63 (!) 150/50  Pulse: 67   Resp:  16  Temp: (!) 97.4 F (36.3 C) 97.6 F (36.4 C)  SpO2: 96%    Vitals:   05/11/23 2007 05/12/23 0428 05/12/23 0547 05/12/23 0836  BP:   (!) 158/63 (!) 150/50  Pulse: 72  67   Resp: 18   16  Temp: 98.1 F (36.7 C)  (!) 97.4 F (36.3 C) 97.6 F (36.4 C)  TempSrc: Oral  Oral Oral  SpO2: 96%  96%   Weight:  66.7 kg    Height:        General: Pt is alert, awake, not in acute distress Cardiovascular: RRR, S1/S2 +, no rubs, no gallops Respiratory: CTA bilaterally, no wheezing, no rhonchi Abdominal: Soft, NT, ND, bowel sounds + Extremities: no edema, no cyanosis    The results of significant diagnostics from this hospitalization (including imaging, microbiology, ancillary and laboratory) are listed below for reference.     Microbiology: Recent Results (from the past 240 hours)  Resp panel by RT-PCR (RSV, Flu A&B, Covid) Anterior Nasal Swab     Status: None   Collection Time: 05/08/23  1:29 PM   Specimen: Anterior Nasal Swab  Result Value Ref Range Status   SARS Coronavirus 2 by RT PCR NEGATIVE NEGATIVE Final   Influenza A by PCR NEGATIVE NEGATIVE Final   Influenza B by PCR NEGATIVE NEGATIVE Final  Comment: (NOTE) The Xpert Xpress SARS-CoV-2/FLU/RSV plus assay is intended as an aid in the diagnosis of influenza from Nasopharyngeal swab specimens and should not be used as a sole basis for treatment. Nasal washings and aspirates are unacceptable for Xpert Xpress SARS-CoV-2/FLU/RSV testing.  Fact Sheet for Patients: BloggerCourse.com  Fact Sheet for Healthcare Providers: SeriousBroker.it  This test is not yet approved or cleared by the  [place]  FDA and has been authorized for detection and/or diagnosis of SARS-CoV-2 by FDA under an Emergency Use Authorization (EUA). This EUA will remain in effect (meaning this test can be used) for the duration of the COVID-19 declaration under Section 564(b)(1) of the Act, 21 U.S.C. section 360bbb-3(b)(1), unless the authorization is terminated or revoked.     Resp Syncytial Virus by PCR NEGATIVE NEGATIVE Final    Comment: (NOTE) Fact Sheet for Patients: BloggerCourse.com  Fact Sheet for Healthcare Providers: SeriousBroker.it  This test is not yet approved or cleared by the [place]  FDA and has been authorized for detection and/or diagnosis of SARS-CoV-2 by FDA under an Emergency Use Authorization (EUA). This EUA will remain in effect (meaning this test can be used) for the duration of the COVID-19 declaration under Section 564(b)(1) of the Act, 21 U.S.C. section 360bbb-3(b)(1), unless the authorization is terminated or revoked.  Performed at [facility], [address], [place], [place] [address]      Labs: BNP (last 3 results) No results for input(s): "BNP" in the last 8760 hours. Basic Metabolic Panel: Recent Labs  Lab 05/08/23 1135 05/08/23 1300 05/09/23 0532 05/10/23 0214 05/11/23 0646 05/12/23 0438  NA 136  --  136 138 139 140  K 2.9*  --  4.3 4.3 4.1 3.9  CL 97*  --  100 104 104 108  CO2 24  --  23 24 25 26   GLUCOSE 120*  --  180* 173* 144* 145*  BUN 11  --  11 14 21 18   CREATININE 0.79  --  0.57 0.60 0.66 0.68  CALCIUM 8.3*  --  8.5* 8.7* 8.2* 8.2*  MG  --  2.0  --  2.3 2.4 2.2  PHOS 3.2  --   --  2.5 2.7 2.6   Liver Function Tests: Recent Labs  Lab 05/08/23 1135 05/09/23 0532 05/10/23 0214 05/11/23 0646 05/12/23 0438  AST 174* 173* 120* 77* 72*  ALT 109* 111* 81* 64* 70*  ALKPHOS 177* 221* 194* 136* 133*  BILITOT 1.0 0.8 0.5 0.6 0.6  PROT 6.6 6.8 6.1* 5.5* 5.6*  ALBUMIN 2.1*  2.0* 1.9* 1.9* 1.9*   No results for input(s): "LIPASE", "AMYLASE" in the last 168 hours. No results for input(s): "AMMONIA" in the last 168 hours. CBC: Recent Labs  Lab 05/08/23 1135 05/09/23 0532 05/10/23 0214 05/11/23 0646 05/12/23 0438  WBC 15.7* 11.1* 20.2* 16.8* 13.3*  NEUTROABS 12.8*  --   --   --   --   HGB 11.4* 11.4* 10.8* 10.0* 10.7*  HCT 35.7* 34.6* 33.6* 31.8* 33.5*  MCV 85.6 83.2 83.4 85.0 85.0  PLT 452* 426* 481* 389 407*   Cardiac Enzymes: Recent Labs  Lab 05/08/23 1135  CKTOTAL 21*   BNP: Invalid input(s): "POCBNP" CBG: Recent Labs  Lab 05/11/23 1131 05/11/23 1648 05/11/23 2121 05/12/23 0743 05/12/23 1124  GLUCAP 160* 214* 181* 104* 196*   D-Dimer No results for input(s): "DDIMER" in the last 72 hours. Hgb A1c No results for input(s): "HGBA1C" in the last 72 hours. Lipid Profile No results  for input(s): "CHOL", "HDL", "LDLCALC", "TRIG", "CHOLHDL", "LDLDIRECT" in the last 72 hours. Thyroid  function studies No results for input(s): "TSH", "T4TOTAL", "T3FREE", "THYROIDAB" in the last 72 hours.  Invalid input(s): "FREET3" Anemia work up No results for input(s): "VITAMINB12", "FOLATE", "FERRITIN", "TIBC", "IRON", "RETICCTPCT" in the last 72 hours. Urinalysis    Component Value Date/Time   COLORURINE YELLOW 09/15/2015 1445   APPEARANCEUR CLEAR 09/15/2015 1445   LABSPEC 1.016 09/15/2015 1445   PHURINE 6.0 09/15/2015 1445   GLUCOSEU NEGATIVE 09/15/2015 1445   HGBUR NEGATIVE 09/15/2015 1445   BILIRUBINUR NEGATIVE 09/15/2015 1445   KETONESUR NEGATIVE 09/15/2015 1445   PROTEINUR NEGATIVE 09/15/2015 1445   NITRITE NEGATIVE 09/15/2015 1445   LEUKOCYTESUR SMALL (A) 09/15/2015 1445   Sepsis Labs Recent Labs  Lab 05/09/23 0532 05/10/23 0214 05/11/23 0646 05/12/23 0438  WBC 11.1* 20.2* 16.8* 13.3*   Microbiology Recent Results (from the past 240 hours)  Resp panel by RT-PCR (RSV, Flu A&B, Covid) Anterior Nasal Swab     Status: None    Collection Time: 05/08/23  1:29 PM   Specimen: Anterior Nasal Swab  Result Value Ref Range Status   SARS Coronavirus 2 by RT PCR NEGATIVE NEGATIVE Final   Influenza A by PCR NEGATIVE NEGATIVE Final   Influenza B by PCR NEGATIVE NEGATIVE Final    Comment: (NOTE) The Xpert Xpress SARS-CoV-2/FLU/RSV plus assay is intended as an aid in the diagnosis of influenza from Nasopharyngeal swab specimens and should not be used as a sole basis for treatment. Nasal washings and aspirates are unacceptable for Xpert Xpress SARS-CoV-2/FLU/RSV testing.  Fact Sheet for Patients: BloggerCourse.com  Fact Sheet for Healthcare Providers: SeriousBroker.it  This test is not yet approved or cleared by the [place]  FDA and has been authorized for detection and/or diagnosis of SARS-CoV-2 by FDA under an Emergency Use Authorization (EUA). This EUA will remain in effect (meaning this test can be used) for the duration of the COVID-19 declaration under Section 564(b)(1) of the Act, 21 U.S.C. section 360bbb-3(b)(1), unless the authorization is terminated or revoked.     Resp Syncytial Virus by PCR NEGATIVE NEGATIVE Final    Comment: (NOTE) Fact Sheet for Patients: BloggerCourse.com  Fact Sheet for Healthcare Providers: SeriousBroker.it  This test is not yet approved or cleared by the [place]  FDA and has been authorized for detection and/or diagnosis of SARS-CoV-2 by FDA under an Emergency Use Authorization (EUA). This EUA will remain in effect (meaning this test can be used) for the duration of the COVID-19 declaration under Section 564(b)(1) of the Act, 21 U.S.C. section 360bbb-3(b)(1), unless the authorization is terminated or revoked.  Performed at [facility], [address], [place], [place] [address]      Time coordinating discharge: Over 30 minutes  SIGNED:   Seena Dadds, MD  [facility] 05/12/2023, 4:47 PM Pager   If 7PM-7AM, please contact night-coverage [email or web address] Password [email or web address]

## 2023-05-12 NOTE — Progress Notes (Signed)
 Occupational Therapy Treatment Patient Details Name: [name] MRN: [id number] DOB: Oct 06, 1933 Today's Date: 05/12/2023   History of present illness 89 y.o. 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 Found +PE. PMH: allergies to ampicillin, adhesives elastic bandages sulfa antibiotics and latex, prediabetes, hypothyroidism, essential hypertension, history of shingles.   OT comments  Patient making good gains with OT treatment. Patient requiring supervision to get to EOB with increased time. Patient able to stand with CGA from EOB and ambulate to bathroom with cues for safety. Patient able to perform toilet hygiene with supervision in standing and ambulate to sink for grooming tasks. PT joined at end of session and began his treatment. Discharge recommendations continue to be appropriate. Acute OT to continue to follow.       If plan is discharge home, recommend the following:  A little help with walking and/or transfers;A lot of help with bathing/dressing/bathroom;Assistance with cooking/housework;Direct supervision/assist for medications management;Direct supervision/assist for financial management;Assist for transportation;Help with stairs or ramp for entrance   Equipment Recommendations  None recommended by OT    Recommendations for Other Services      Precautions / Restrictions Precautions Precautions: Fall Restrictions Weight Bearing Restrictions Per Provider Order: No       Mobility Bed Mobility Overal bed mobility: Needs Assistance Bed Mobility: Supine to Sit     Supine to sit: Supervision, Used rails     General bed mobility comments: increased time and supervision with rail use to assist    Transfers Overall transfer level: Needs assistance Equipment used: Rolling walker (2 wheels) Transfers: Sit to/from Stand Sit to Stand: Supervision, Contact guard assist            General transfer comment: supervision to CGA for safety     Balance Overall balance assessment: Needs assistance Sitting-balance support: No upper extremity supported, Feet supported Sitting balance-Leahy Scale: Fair Sitting balance - Comments: EOB   Standing balance support: No upper extremity supported, During functional activity Standing balance-Leahy Scale: Fair Standing balance comment: able to stand at sink with no UE support                           ADL either performed or assessed with clinical judgement   ADL Overall ADL's : Needs assistance/impaired     Grooming: Set up;Standing;Wash/dry hands;Wash/dry face;Oral care                   Toilet Transfer: Contact guard assist;Ambulation;Regular Toilet;Rolling walker (2 wheels);Grab bars Toilet Transfer Details (indicate cue type and reason): CGA for safety Toileting- Clothing Manipulation and Hygiene: Supervision/safety;Sit to/from stand Toileting - Clothing Manipulation Details (indicate cue type and reason): performed toilet hygiene in standing     Functional mobility during ADLs: Contact guard assist General ADL Comments: CGA for mobility in room and bathroom for safety    Extremity/Trunk Assessment Upper Extremity Assessment Upper Extremity Assessment: Generalized weakness            Vision       Perception     Praxis     Communication Communication Communication: No apparent difficulties   Cognition Arousal: Alert Behavior During Therapy: WFL for tasks assessed/performed Cognition: No apparent impairments                               Following commands:  Intact        Cueing   Cueing Techniques: Verbal cues  Exercises      Shoulder Instructions       General Comments VSS    Pertinent Vitals/ Pain       Pain Assessment Pain Assessment: Faces Faces Pain Scale: Hurts even more Pain Location: BLEs with bed mobility Pain Descriptors / Indicators:  Tender, Grimacing, Guarding Pain Intervention(s): Monitored during session  Home Living                                          Prior Functioning/Environment              Frequency  Min 2X/week        Progress Toward Goals  OT Goals(current goals can now be found in the care plan section)  Progress towards OT goals: Progressing toward goals  Acute Rehab OT Goals Patient Stated Goal: to go home OT Goal Formulation: With patient Time For Goal Achievement: 05/24/23 Potential to Achieve Goals: Good ADL Goals Pt Will Perform Upper Body Dressing: Independently;sitting Pt Will Perform Lower Body Dressing: with supervision;sitting/lateral leans;sit to/from stand Pt Will Transfer to Toilet: with supervision;ambulating;regular height toilet Additional ADL Goal #1: Pt will utilize visual scanning strategies to attend to L side with min cues in prep for ADLs  Plan      Co-evaluation                 AM-PAC OT "6 Clicks" Daily Activity     Outcome Measure   Help from another person eating meals?: A Little Help from another person taking care of personal grooming?: A Little Help from another person toileting, which includes using toliet, bedpan, or urinal?: A Little Help from another person bathing (including washing, rinsing, drying)?: A Lot Help from another person to put on and taking off regular upper body clothing?: A Little Help from another person to put on and taking off regular lower body clothing?: A Lot 6 Click Score: 16    End of Session Equipment Utilized During Treatment: Rolling walker (2 wheels)  OT Visit Diagnosis: Unsteadiness on feet (R26.81);Other abnormalities of gait and mobility (R26.89);Muscle weakness (generalized) (M62.81)   Activity Tolerance Patient tolerated treatment well   Patient Left Other (comment) (left standing at sink with PT)   Nurse Communication Mobility status        Time: 1610-9604 OT Time  Calculation (min): 30 min  Charges: OT General Charges $OT Visit: 1 Visit OT Treatments $Self Care/Home Management : 23-37 mins  [name], OTA Acute Rehabilitation Services  Office [phone]   [name] 05/12/2023, 12:41 PM

## 2023-05-12 NOTE — Telephone Encounter (Signed)
 Patient Product/process development scientist completed.    The patient is insured through [employer]/ [employer]. Patient has Medicare and is not eligible for a copay card, but may be able to apply for patient assistance or Medicare RX Payment Plan (Patient Must reach out to their plan, if eligible for payment plan), if available.    Ran test claim for Eliquis Starter Pack and the current 30 day co-pay is $47.00.  Ran test claim for Eliquis 5 mgand the current 30 day co-pay is $47.00.  This test claim was processed through [facility]- copay amounts may vary at other pharmacies due to pharmacy/plan contracts, or as the patient moves through the different stages of their insurance plan.     Morgan Arab, CPHT Pharmacy Technician III Certified Patient Advocate [facility] Patient Advocate Team Direct Number: [phone]  Fax: [phone]

## 2023-05-12 NOTE — TOC Transition Note (Signed)
 Transition of Care [facility]) - Discharge Note   Patient Details  Name: [name] MRN: [id number] Date of Birth: 01/06/1934  Transition of [facility]) CM/SW Contact:  Eusebio High, RN Phone Number: 05/12/2023, 11:05 AM   Clinical Narrative:     Patient will DC to home today. Home Health PT and OT will be provided by [facility]   .  AVS updated. Daughter will transport  No additional TOC needs           Patient Goals and CMS Choice            Discharge Placement                       Discharge Plan and Services Additional resources added to the After Visit Summary for                                       Social Drivers of Health (SDOH) Interventions SDOH Screenings   Food Insecurity: No Food Insecurity (05/12/2023)  Housing: Low Risk  (05/12/2023)  Transportation Needs: No Transportation Needs (05/12/2023)  Utilities: Not At Risk (05/12/2023)  Social Connections: Unknown (05/12/2023)  Stress: No Stress Concern Present (04/16/2022)   Received from [facility]  Tobacco Use: Low Risk  (05/09/2023)     Readmission Risk Interventions     No data to display

## 2023-05-12 NOTE — Progress Notes (Signed)
 Telemetry called to report two events of patient dropping into 40's while asleep but rebounding.  When I went to assess patient was asleep and HR in the 60's.  Glynda Lash, MD night [facility] notified.

## 2023-05-12 NOTE — Progress Notes (Signed)
 Patient states all belongings brought to the hospital at time of admission are accounted for and packed to take home.  Picked up medications from [facility] pharmacy. Pt transported to entrance A where family member was waiting in vehicle to transport home.

## 2023-05-13 DIAGNOSIS — I5031 Acute diastolic (congestive) heart failure: Secondary | ICD-10-CM | POA: Diagnosis not present

## 2023-05-13 DIAGNOSIS — I447 Left bundle-branch block, unspecified: Secondary | ICD-10-CM | POA: Diagnosis not present

## 2023-05-13 DIAGNOSIS — D62 Acute posthemorrhagic anemia: Secondary | ICD-10-CM | POA: Diagnosis not present

## 2023-05-13 DIAGNOSIS — H47012 Ischemic optic neuropathy, left eye: Secondary | ICD-10-CM | POA: Diagnosis not present

## 2023-05-13 DIAGNOSIS — I2510 Atherosclerotic heart disease of native coronary artery without angina pectoris: Secondary | ICD-10-CM | POA: Diagnosis not present

## 2023-05-13 DIAGNOSIS — M170 Bilateral primary osteoarthritis of knee: Secondary | ICD-10-CM | POA: Diagnosis not present

## 2023-05-13 DIAGNOSIS — I872 Venous insufficiency (chronic) (peripheral): Secondary | ICD-10-CM | POA: Diagnosis not present

## 2023-05-13 DIAGNOSIS — I341 Nonrheumatic mitral (valve) prolapse: Secondary | ICD-10-CM | POA: Diagnosis not present

## 2023-05-13 DIAGNOSIS — K219 Gastro-esophageal reflux disease without esophagitis: Secondary | ICD-10-CM | POA: Diagnosis not present

## 2023-05-13 DIAGNOSIS — R1310 Dysphagia, unspecified: Secondary | ICD-10-CM | POA: Diagnosis not present

## 2023-05-13 DIAGNOSIS — Z556 Problems related to health literacy: Secondary | ICD-10-CM | POA: Diagnosis not present

## 2023-05-13 DIAGNOSIS — L97821 Non-pressure chronic ulcer of other part of left lower leg limited to breakdown of skin: Secondary | ICD-10-CM | POA: Diagnosis not present

## 2023-05-13 DIAGNOSIS — Z7952 Long term (current) use of systemic steroids: Secondary | ICD-10-CM | POA: Diagnosis not present

## 2023-05-13 DIAGNOSIS — I110 Hypertensive heart disease with heart failure: Secondary | ICD-10-CM | POA: Diagnosis not present

## 2023-05-13 DIAGNOSIS — E039 Hypothyroidism, unspecified: Secondary | ICD-10-CM | POA: Diagnosis not present

## 2023-05-13 DIAGNOSIS — M316 Other giant cell arteritis: Secondary | ICD-10-CM | POA: Diagnosis not present

## 2023-05-13 DIAGNOSIS — I5030 Unspecified diastolic (congestive) heart failure: Secondary | ICD-10-CM | POA: Diagnosis not present

## 2023-05-13 DIAGNOSIS — Z7984 Long term (current) use of oral hypoglycemic drugs: Secondary | ICD-10-CM | POA: Diagnosis not present

## 2023-05-13 DIAGNOSIS — I2699 Other pulmonary embolism without acute cor pulmonale: Secondary | ICD-10-CM | POA: Diagnosis not present

## 2023-05-13 DIAGNOSIS — G4700 Insomnia, unspecified: Secondary | ICD-10-CM | POA: Diagnosis not present

## 2023-05-13 DIAGNOSIS — I82409 Acute embolism and thrombosis of unspecified deep veins of unspecified lower extremity: Secondary | ICD-10-CM | POA: Diagnosis not present

## 2023-05-13 DIAGNOSIS — R627 Adult failure to thrive: Secondary | ICD-10-CM | POA: Diagnosis not present

## 2023-05-13 DIAGNOSIS — Z7951 Long term (current) use of inhaled steroids: Secondary | ICD-10-CM | POA: Diagnosis not present

## 2023-05-13 DIAGNOSIS — F32A Depression, unspecified: Secondary | ICD-10-CM | POA: Diagnosis not present

## 2023-05-13 DIAGNOSIS — S81811D Laceration without foreign body, right lower leg, subsequent encounter: Secondary | ICD-10-CM | POA: Diagnosis not present

## 2023-05-13 DIAGNOSIS — E785 Hyperlipidemia, unspecified: Secondary | ICD-10-CM | POA: Diagnosis not present

## 2023-05-13 DIAGNOSIS — Z7901 Long term (current) use of anticoagulants: Secondary | ICD-10-CM | POA: Diagnosis not present

## 2023-05-13 DIAGNOSIS — N83299 Other ovarian cyst, unspecified side: Secondary | ICD-10-CM | POA: Diagnosis not present

## 2023-05-13 NOTE — Care Management Important Message (Signed)
 Important Message  Patient Details  Name: [name] MRN: [id number] Date of Birth: August 27, 1933   Important Message Given:  Yes - Medicare IM     Wynonia Hedges 05/13/2023, 3:46 PM

## 2023-05-13 NOTE — Care Management Important Message (Signed)
 Important Message  Patient Details  Name: [name] MRN: [id number] Date of Birth: January 12, 1933   Important Message Given:  Yes - Medicare IM   Patient left prior to IM delivery will mail a copy to the patient home address.  [name] 05/13/2023, 10:51 AM

## 2023-05-15 ENCOUNTER — Ambulatory Visit (HOSPITAL_COMMUNITY)

## 2023-05-16 NOTE — Anesthesia Preprocedure Evaluation (Signed)
 Anesthesia Evaluation  Patient identified by MRN, date of birth, ID band  Reviewed: Allergy & Precautions, NPO status , Patient's Chart, lab work & pertinent test results  History of Anesthesia Complications Negative for: history of anesthetic complications  Airway Mallampati: III  TM Distance: >3 FB Neck ROM: Full    Dental  (+) Dental Advisory Given   Pulmonary neg shortness of breath, neg sleep apnea, neg COPD, neg recent URI   breath sounds clear to auscultation       Cardiovascular hypertension, + CAD and +CHF  + dysrhythmias + Valvular Problems/Murmurs  Rhythm:Regular     Neuro/Psych  negative psych ROS   GI/Hepatic ,GERD  ,,  Endo/Other  Hypothyroidism    Renal/GU      Musculoskeletal  (+) Arthritis ,    Abdominal   Peds  Hematology  (+) Blood dyscrasia, anemia   Anesthesia Other Findings   Reproductive/Obstetrics                             Anesthesia Physical Anesthesia Plan  ASA: 3  Anesthesia Plan: MAC   Post-op Pain Management: Minimal or no pain anticipated   Induction: Intravenous  PONV Risk Score and Plan: 2 and Propofol  infusion and Treatment may vary due to age or medical condition  Airway Management Planned: Nasal Cannula, Natural Airway and Simple Face Mask  Additional Equipment: None  Intra-op Plan:   Post-operative Plan:   Informed Consent: I have reviewed the patients History and Physical, chart, labs and discussed the procedure including the risks, benefits and alternatives for the proposed anesthesia with the patient or authorized representative who has indicated his/her understanding and acceptance.     Dental advisory given  Plan Discussed with: CRNA  Anesthesia Plan Comments:        Anesthesia Quick Evaluation

## 2023-05-16 NOTE — Anesthesia Postprocedure Evaluation (Signed)
 Anesthesia Post Note  Patient: [name]  Procedure(s) Performed: BIOPSY TEMPORAL ARTERY (Left)     Patient location during evaluation: PACU Anesthesia Type: MAC Level of consciousness: awake and patient cooperative Pain management: pain level controlled Vital Signs Assessment: post-procedure vital signs reviewed and stable Respiratory status: spontaneous breathing, nonlabored ventilation and respiratory function stable Cardiovascular status: stable and blood pressure returned to baseline Postop Assessment: no apparent nausea or vomiting Anesthetic complications: no   No notable events documented.         Wren Gallaga

## 2023-05-20 DIAGNOSIS — H35372 Puckering of macula, left eye: Secondary | ICD-10-CM | POA: Diagnosis not present

## 2023-05-20 DIAGNOSIS — H34231 Retinal artery branch occlusion, right eye: Secondary | ICD-10-CM | POA: Diagnosis not present

## 2023-05-20 DIAGNOSIS — H353131 Nonexudative age-related macular degeneration, bilateral, early dry stage: Secondary | ICD-10-CM | POA: Diagnosis not present

## 2023-05-20 DIAGNOSIS — H47013 Ischemic optic neuropathy, bilateral: Secondary | ICD-10-CM | POA: Diagnosis not present

## 2023-05-20 DIAGNOSIS — M316 Other giant cell arteritis: Secondary | ICD-10-CM | POA: Diagnosis not present

## 2023-05-20 DIAGNOSIS — H3412 Central retinal artery occlusion, left eye: Secondary | ICD-10-CM | POA: Diagnosis not present

## 2023-05-20 DIAGNOSIS — H35371 Puckering of macula, right eye: Secondary | ICD-10-CM | POA: Diagnosis not present

## 2023-05-21 DIAGNOSIS — Z79899 Other long term (current) drug therapy: Secondary | ICD-10-CM | POA: Diagnosis not present

## 2023-05-21 DIAGNOSIS — R7303 Prediabetes: Secondary | ICD-10-CM | POA: Diagnosis not present

## 2023-05-21 DIAGNOSIS — Z72820 Sleep deprivation: Secondary | ICD-10-CM | POA: Diagnosis not present

## 2023-05-21 DIAGNOSIS — H5440 Blindness, one eye, unspecified eye: Secondary | ICD-10-CM | POA: Diagnosis not present

## 2023-05-21 DIAGNOSIS — I10 Essential (primary) hypertension: Secondary | ICD-10-CM | POA: Diagnosis not present

## 2023-05-21 DIAGNOSIS — Z6828 Body mass index (BMI) 28.0-28.9, adult: Secondary | ICD-10-CM | POA: Diagnosis not present

## 2023-05-21 DIAGNOSIS — I2699 Other pulmonary embolism without acute cor pulmonale: Secondary | ICD-10-CM | POA: Diagnosis not present

## 2023-05-21 DIAGNOSIS — Z09 Encounter for follow-up examination after completed treatment for conditions other than malignant neoplasm: Secondary | ICD-10-CM | POA: Diagnosis not present

## 2023-05-21 DIAGNOSIS — D649 Anemia, unspecified: Secondary | ICD-10-CM | POA: Diagnosis not present

## 2023-05-21 DIAGNOSIS — M316 Other giant cell arteritis: Secondary | ICD-10-CM | POA: Diagnosis not present

## 2023-05-29 DIAGNOSIS — E663 Overweight: Secondary | ICD-10-CM | POA: Diagnosis not present

## 2023-05-29 DIAGNOSIS — K219 Gastro-esophageal reflux disease without esophagitis: Secondary | ICD-10-CM | POA: Diagnosis not present

## 2023-05-29 DIAGNOSIS — R768 Other specified abnormal immunological findings in serum: Secondary | ICD-10-CM | POA: Diagnosis not present

## 2023-05-29 DIAGNOSIS — M316 Other giant cell arteritis: Secondary | ICD-10-CM | POA: Diagnosis not present

## 2023-05-29 DIAGNOSIS — M170 Bilateral primary osteoarthritis of knee: Secondary | ICD-10-CM | POA: Diagnosis not present

## 2023-05-29 DIAGNOSIS — R7303 Prediabetes: Secondary | ICD-10-CM | POA: Diagnosis not present

## 2023-05-29 DIAGNOSIS — Z6828 Body mass index (BMI) 28.0-28.9, adult: Secondary | ICD-10-CM | POA: Diagnosis not present

## 2023-05-29 DIAGNOSIS — Z111 Encounter for screening for respiratory tuberculosis: Secondary | ICD-10-CM | POA: Diagnosis not present

## 2023-05-29 DIAGNOSIS — R7989 Other specified abnormal findings of blood chemistry: Secondary | ICD-10-CM | POA: Diagnosis not present

## 2023-06-14 NOTE — Progress Notes (Unsigned)
 Cardiology Office Note   Date:  06/16/2023   ID:  [name], DOB May 05, 1933, MRN [id number]  PCP:  Jimmey Mould, MD  Cardiologist:   None Referring:  Jimmey Mould, MD  No chief complaint on file.     History of Present Illness: [name] is a 89 y.o. female who presents for evaluation of cardiomyopathy.      The patient was in the hospital last month.  She had TA treated with steroids.  She had an EF of 40% noted and LBBB.   She has not had prior cardiac history before this.  She gets around slowly and house.  She has some balance and joint problems.  She uses a walker in the house but this is just since her hospitalization.  She actually lives alone but her daughter has been staying with her.  She does do some light household chores.  She is on steroids now and has had some increased lower extremity swelling.  She is not however describing chest pressure, neck or arm discomfort.  She has had no new shortness of breath, PND or orthopnea.  She has no palpitations, presyncope or syncope.   Past Medical History:  Diagnosis Date   Acute blood loss anemia    Arthritis    Benign essential HTN    pt denies    Constipation    GERD (gastroesophageal reflux disease)    infrequently - otc med as needed   Heart murmur    "leacky heart valve"agrivated by caffiene / activity - no heart studies have been done per pt   Hyponatremia    Hypothyroidism    Leukocytosis    MVP (mitral valve prolapse)    Primary osteoarthritis of right knee    Rosacea    Shingles 10/2017   Unsteady gait     Past Surgical History:  Procedure Laterality Date   ARTERY BIOPSY Left 05/09/2023   Procedure: BIOPSY TEMPORAL ARTERY;  Surgeon: Young Hensen, MD;  Location: [facility] OR;  Service: Vascular;  Laterality: Left;   BUNIONECTOMY     CHOLECYSTECTOMY     COLONOSCOPY     2004 & 06/17/12   FINGER SURGERY     rt thumb   TOTAL KNEE ARTHROPLASTY Right 02/02/2015   Procedure: TOTAL  RIGHT KNEE ARTHROPLASTY;  Surgeon: Orvan Blanch, MD;  Location: [facility] ORS;  Service: Orthopedics;  Laterality: Right;   TOTAL KNEE ARTHROPLASTY Left 09/21/2015   Procedure: LEFT TOTAL KNEE ARTHROPLASTY;  Surgeon: Orvan Blanch, MD;  Location: [facility] ORS;  Service: Orthopedics;  Laterality: Left;   TUBAL LIGATION       Current Outpatient Medications  Medication Sig Dispense Refill   acetaminophen  (TYLENOL ) 500 MG tablet Take 500 mg by mouth every 6 (six) hours as needed for fever, moderate pain (pain score 4-6) or mild pain (pain score 1-3).     APIXABAN  (ELIQUIS ) VTE STARTER PACK (10MG  AND 5MG ) Take as directed on package: start with two-5mg  tablets twice daily for 7 days. On day 8, switch to one-5mg  tablet twice daily. 74 each 0   Biotin 1000 MCG tablet Take 1,000 mcg by mouth at bedtime.     CALCIUM MAGNESIUM  ZINC PO Take 1 tablet by mouth at bedtime.     carvedilol  (COREG ) 3.125 MG tablet Take 1 tablet (3.125 mg total) by mouth 2 (two) times daily with a meal. 60 tablet 0   Cholecalciferol  (VITAMIN D -3 PO) Take 1 tablet by mouth at bedtime.  docusate sodium  (COLACE) 100 MG capsule Take 1 capsule (100 mg total) by mouth 2 (two) times daily as needed for mild constipation.     empagliflozin  (JARDIANCE ) 10 MG TABS tablet Take 1 tablet (10 mg total) by mouth daily. 30 tablet 0   Homeopathic Products (CALENDULA EX) Apply 1 Application topically as needed.     IRON, FERROUS SULFATE, PO Take 65 capsules by mouth.     levothyroxine  (SYNTHROID ) 75 MCG tablet Take 75 mcg by mouth daily before breakfast. TAKE 1 TABLET IN THE MORNING ON AN EMPTY STOMACH MON-SAT AND 1/2 TABLET ON SUNDAY     losartan  (COZAAR ) 25 MG tablet Take 1 tablet (25 mg total) by mouth daily. 30 tablet 0   MAGNESIUM  PO Take 1 tablet by mouth at bedtime.     melatonin 5 MG TABS Take 1 tablet (5 mg total) by mouth at bedtime.     nystatin  (MYCOSTATIN ) 100000 UNIT/ML suspension Take 5 mLs (500,000 Units total) by mouth 4 (four) times  daily. 120 mL 0   pantoprazole  (PROTONIX ) 40 MG tablet Take 1 tablet (40 mg total) by mouth daily. 30 tablet 0   Polyethyl Glycol-Propyl Glycol (SYSTANE OP) Apply 1 drop to eye as needed. Both eyes     predniSONE  (DELTASONE ) 20 MG tablet Take 3 tablets (60 mg total) by mouth daily with breakfast. 80 tablet 0   Probiotic Product (PROBIOTIC BLEND PO) Take by mouth.     sulfamethoxazole -trimethoprim  (BACTRIM  DS) 800-160 MG tablet Take 1 tablet by mouth 3 (three) times a week. 30 tablet 0   mirtazapine  (REMERON  SOL-TAB) 15 MG disintegrating tablet Dissolve 0.5 tablets (7.5 mg total) under the tongue at bedtime. (Patient not taking: Reported on 06/16/2023) 30 tablet 0   No current facility-administered medications for this visit.    Allergies:   Ampicillin, Elastic bandages & [zinc], Sulfa  antibiotics, and Latex    Social History:  The patient  reports that she has never smoked. She has never used smokeless tobacco. She reports that she does not drink alcohol  and does not use drugs.   Family History:  The patient's family history includes Aneurysm in her brother; Breast cancer in her sister; Congestive Heart Failure in her father; Dementia in her sister; Fibromyalgia in her sister; Lung cancer in her brother and brother; Other in her brother; Prostate cancer in her brother; Rheum arthritis in her sister; Stroke in her mother.    ROS:  Please see the history of present illness.   Otherwise, review of systems are positive for none.   All other systems are reviewed and negative.    PHYSICAL EXAM: VS:  BP (!) 126/48 (BP Location: Right Arm, Patient Position: Sitting, Cuff Size: Normal)   Pulse 75   Ht 4\' 10"  (1.473 m)   Wt 142 lb 9.6 oz (64.7 kg)   SpO2 98%   BMI 29.80 kg/m  , BMI Body mass index is 29.8 kg/m. GENERAL:  Well appearing HEENT:  Pupils equal round and reactive, fundi not visualized, oral mucosa unremarkable NECK:  No jugular venous distention, waveform within normal limits,  carotid upstroke brisk and symmetric, no bruits, no thyromegaly LYMPHATICS:  No cervical, inguinal adenopathy LUNGS:  Clear to auscultation bilaterally BACK:  No CVA tenderness CHEST:  Unremarkable HEART:  PMI not displaced or sustained,S1 and S2 within normal limits, no S3, no S4, no clicks, no rubs, 2 out of 6 apical systolic murmurs ABD:  Flat, positive bowel sounds normal in frequency in pitch, no bruits,  no rebound, no guarding, no midline pulsatile mass, no hepatomegaly, no splenomegaly EXT:  2 plus pulses throughout, mild leg edema, no cyanosis no clubbing SKIN:  No rashes no nodules NEURO:  Cranial nerves II through XII grossly intact, motor grossly intact throughout [facility]:  Cognitively intact, oriented to person place and time    EKG:    Sinus rhythm, rate 94, left axis deviation, interventricular conduction delay, 05/08/2023    Recent Labs: 05/08/2023: TSH 6.647 05/12/2023: ALT 70; BUN 18; Creatinine, Ser 0.68; Hemoglobin 10.7; Magnesium  2.2; Platelets 407; Potassium 3.9; Sodium 140    Lipid Panel    Component Value Date/Time   CHOL 174 05/08/2023 1700   TRIG 104 05/08/2023 1700   HDL 32 (L) 05/08/2023 1700   CHOLHDL 5.4 05/08/2023 1700   VLDL 21 05/08/2023 1700   LDLCALC 121 (H) 05/08/2023 1700      Wt Readings from Last 3 Encounters:  06/16/23 142 lb 9.6 oz (64.7 kg)  05/12/23 147 lb 0.8 oz (66.7 kg)  12/17/17 139 lb (63 kg)      Other studies Reviewed: Additional studies/ records that were reviewed today include: Hospital records. Review of the above records demonstrates:  Please see elsewhere in the note.     ASSESSMENT AND PLAN:   Coronary calcium:  The patient has no ongoing chest pain.  This is an incidental finding and I do not think deserves further workup.  She would let me know if she ever has chest discomfort and I discussed this with her daughters who are in the room with her.  Cardiomyopathy: She has a cardiomyopathy.  At some point might do an  ischemia evaluation but for now she can recover from the temporal arteritis and we will do med titration.  I will move very slowly and then increase her carvedilol  to 6.25 mg twice daily.  She does have a little lower extremity swelling which might be related to the steroids but I am just going to give her Lasix 20 mg a day just for couple of days to see if helps.  We talked about as needed dosing.  Elevated liver enzymes: These are trending down.  However, because of this I would avoid statins.  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  Per her primary provider.  MR: This was mild and can be followed clinically.  Current medicines are reviewed at length with the patient today.  The patient does not have concerns regarding medicines.  The following changes have been made:  As above  Labs/ tests ordered today include:  No orders of the defined types were placed in this encounter.    Disposition:   FU with APP in 3 - 4 months.     Signed, Eilleen Grates, MD  06/16/2023 10:56 AM    [facility]

## 2023-06-16 ENCOUNTER — Ambulatory Visit: Attending: Cardiology | Admitting: Cardiology

## 2023-06-16 ENCOUNTER — Encounter: Payer: Self-pay | Admitting: Cardiology

## 2023-06-16 VITALS — BP 126/48 | HR 75 | Ht <= 58 in | Wt 142.6 lb

## 2023-06-16 DIAGNOSIS — I5021 Acute systolic (congestive) heart failure: Secondary | ICD-10-CM | POA: Diagnosis not present

## 2023-06-16 DIAGNOSIS — R931 Abnormal findings on diagnostic imaging of heart and coronary circulation: Secondary | ICD-10-CM | POA: Diagnosis not present

## 2023-06-16 DIAGNOSIS — I447 Left bundle-branch block, unspecified: Secondary | ICD-10-CM | POA: Diagnosis not present

## 2023-06-16 MED ORDER — FUROSEMIDE 20 MG PO TABS: 20.0000 mg | ORAL_TABLET | Freq: Every day | ORAL | 3 refills | Status: DC

## 2023-06-16 MED ORDER — CARVEDILOL 6.25 MG PO TABS
6.2500 mg | ORAL_TABLET | Freq: Two times a day (BID) | ORAL | 3 refills | Status: AC
Start: 2023-06-16 — End: ?

## 2023-06-16 NOTE — Patient Instructions (Signed)
 Medication Instructions:  Start Lasix 20 mg once daily for 2 days. Use as needed for swelling. Increase carvedilol  to 6.25 mg twice daily *If you need a refill on your cardiac medications before your next appointment, please call your pharmacy*  Lab Work: NONE If you have labs (blood work) drawn today and your tests are completely normal, you will receive your results only by: MyChart Message (if you have MyChart) OR A paper copy in the mail If you have any lab test that is abnormal or we need to change your treatment, we will call you to review the results.  Testing/Procedures: NONE  Follow-Up: At [facility], you and your health needs are our priority.  As part of our continuing mission to provide you with exceptional heart care, our providers are all part of one team.  This team includes your primary Cardiologist (physician) and Advanced Practice Providers or APPs (Physician Assistants and Nurse Practitioners) who all work together to provide you with the care you need, when you need it.  Your next appointment:   3-4 months  Provider:   APP  We recommend signing up for the patient portal called "MyChart".  Sign up information is provided on this After Visit Summary.  MyChart is used to connect with patients for Virtual Visits (Telemedicine).  Patients are able to view lab/test results, encounter notes, upcoming appointments, etc.  Non-urgent messages can be sent to your provider as well.   To learn more about what you can do with MyChart, go to [email or web address].

## 2023-06-17 DIAGNOSIS — H35372 Puckering of macula, left eye: Secondary | ICD-10-CM | POA: Diagnosis not present

## 2023-06-17 DIAGNOSIS — H353131 Nonexudative age-related macular degeneration, bilateral, early dry stage: Secondary | ICD-10-CM | POA: Diagnosis not present

## 2023-06-17 DIAGNOSIS — M316 Other giant cell arteritis: Secondary | ICD-10-CM | POA: Diagnosis not present

## 2023-06-17 DIAGNOSIS — H34231 Retinal artery branch occlusion, right eye: Secondary | ICD-10-CM | POA: Diagnosis not present

## 2023-06-17 DIAGNOSIS — H35371 Puckering of macula, right eye: Secondary | ICD-10-CM | POA: Diagnosis not present

## 2023-06-17 DIAGNOSIS — H3412 Central retinal artery occlusion, left eye: Secondary | ICD-10-CM | POA: Diagnosis not present

## 2023-06-17 DIAGNOSIS — H47013 Ischemic optic neuropathy, bilateral: Secondary | ICD-10-CM | POA: Diagnosis not present

## 2023-06-30 ENCOUNTER — Telehealth: Payer: Self-pay | Admitting: Cardiology

## 2023-06-30 DIAGNOSIS — I5021 Acute systolic (congestive) heart failure: Secondary | ICD-10-CM

## 2023-06-30 NOTE — Telephone Encounter (Signed)
 Pt c/o swelling: STAT is pt has developed SOB within 24 hours  How much weight have you gained and in what time span?    If swelling, where is the swelling located?  Ankles weeping fluid + daughter notices puffiness in face  Are you currently taking a fluid pill?  Furosemide  20 MG every other day  Are you currently SOB?   Do you have a log of your daily weights (if so, list)?  6/21: 144 lbs 6/22: 144 lbs 6/23: 145.4 lbs  Have you gained 3 pounds in a day or 5 pounds in a week?  No   Have you traveled recently?  No

## 2023-07-01 DIAGNOSIS — H34231 Retinal artery branch occlusion, right eye: Secondary | ICD-10-CM | POA: Diagnosis not present

## 2023-07-01 DIAGNOSIS — H3412 Central retinal artery occlusion, left eye: Secondary | ICD-10-CM | POA: Diagnosis not present

## 2023-07-01 DIAGNOSIS — H348321 Tributary (branch) retinal vein occlusion, left eye, with retinal neovascularization: Secondary | ICD-10-CM | POA: Diagnosis not present

## 2023-07-01 NOTE — Telephone Encounter (Signed)
 Late entry:  Pts daughter called to report that the pt has had bilateral ankle edema for several weeks and they have been weeping fluid. No edema of her hands or abdominal area.   She works with PT and does well but has some SOB with exertion.   Her weights: 6/21: 144 lbs 6/22: 144 lbs 6/23: 145.4 lbs   Pt is still on Prednisone  but weaning down but will still be on it for several weeks longer.. she is down to 40 mg daily.  Her daughter has been giving her Lasix  20 mg every other day and not daily... she will keep track of her BP and let us  know what it is running and she will give her a lasix  today even though today would have been an off day.   I will route to Dr Lavona for his review and recommendations.   Last CMET 05/12/23

## 2023-07-02 NOTE — Telephone Encounter (Signed)
 Spoke with daughter and she is aware patient can start lasix  20 mg daily and have BMET next week. Pam verbalized understanding

## 2023-07-03 DIAGNOSIS — M316 Other giant cell arteritis: Secondary | ICD-10-CM | POA: Diagnosis not present

## 2023-07-03 DIAGNOSIS — R7989 Other specified abnormal findings of blood chemistry: Secondary | ICD-10-CM | POA: Diagnosis not present

## 2023-07-03 DIAGNOSIS — R7303 Prediabetes: Secondary | ICD-10-CM | POA: Diagnosis not present

## 2023-07-03 DIAGNOSIS — R768 Other specified abnormal immunological findings in serum: Secondary | ICD-10-CM | POA: Diagnosis not present

## 2023-07-03 DIAGNOSIS — E669 Obesity, unspecified: Secondary | ICD-10-CM | POA: Diagnosis not present

## 2023-07-03 DIAGNOSIS — M170 Bilateral primary osteoarthritis of knee: Secondary | ICD-10-CM | POA: Diagnosis not present

## 2023-07-03 DIAGNOSIS — Z6830 Body mass index (BMI) 30.0-30.9, adult: Secondary | ICD-10-CM | POA: Diagnosis not present

## 2023-07-03 DIAGNOSIS — K219 Gastro-esophageal reflux disease without esophagitis: Secondary | ICD-10-CM | POA: Diagnosis not present

## 2023-07-09 ENCOUNTER — Other Ambulatory Visit: Payer: Self-pay

## 2023-07-09 DIAGNOSIS — I5021 Acute systolic (congestive) heart failure: Secondary | ICD-10-CM

## 2023-07-10 ENCOUNTER — Ambulatory Visit: Payer: Self-pay | Admitting: Cardiology

## 2023-07-10 LAB — BASIC METABOLIC PANEL WITH GFR
BUN/Creatinine Ratio: 37 — ABNORMAL HIGH (ref 12–28)
BUN: 38 mg/dL — ABNORMAL HIGH (ref 8–27)
CO2: 24 mmol/L (ref 20–29)
Calcium: 9 mg/dL (ref 8.7–10.3)
Chloride: 101 mmol/L (ref 96–106)
Creatinine, Ser: 1.04 mg/dL — ABNORMAL HIGH (ref 0.57–1.00)
Glucose: 105 mg/dL — ABNORMAL HIGH (ref 70–99)
Potassium: 4.1 mmol/L (ref 3.5–5.2)
Sodium: 140 mmol/L (ref 134–144)
eGFR: 52 mL/min/{1.73_m2} — ABNORMAL LOW (ref >59)

## 2023-07-14 DIAGNOSIS — E782 Mixed hyperlipidemia: Secondary | ICD-10-CM | POA: Diagnosis not present

## 2023-07-14 DIAGNOSIS — Z0000 Encounter for general adult medical examination without abnormal findings: Secondary | ICD-10-CM | POA: Diagnosis not present

## 2023-07-14 DIAGNOSIS — Z79899 Other long term (current) drug therapy: Secondary | ICD-10-CM | POA: Diagnosis not present

## 2023-07-14 DIAGNOSIS — E039 Hypothyroidism, unspecified: Secondary | ICD-10-CM | POA: Diagnosis not present

## 2023-07-14 DIAGNOSIS — M179 Osteoarthritis of knee, unspecified: Secondary | ICD-10-CM | POA: Diagnosis not present

## 2023-07-14 DIAGNOSIS — M316 Other giant cell arteritis: Secondary | ICD-10-CM | POA: Diagnosis not present

## 2023-07-14 DIAGNOSIS — R7303 Prediabetes: Secondary | ICD-10-CM | POA: Diagnosis not present

## 2023-07-14 DIAGNOSIS — Z86711 Personal history of pulmonary embolism: Secondary | ICD-10-CM | POA: Diagnosis not present

## 2023-07-14 DIAGNOSIS — R609 Edema, unspecified: Secondary | ICD-10-CM | POA: Diagnosis not present

## 2023-07-14 DIAGNOSIS — Z6833 Body mass index (BMI) 33.0-33.9, adult: Secondary | ICD-10-CM | POA: Diagnosis not present

## 2023-07-14 DIAGNOSIS — E559 Vitamin D deficiency, unspecified: Secondary | ICD-10-CM | POA: Diagnosis not present

## 2023-07-21 DIAGNOSIS — H35373 Puckering of macula, bilateral: Secondary | ICD-10-CM | POA: Diagnosis not present

## 2023-07-21 DIAGNOSIS — H34231 Retinal artery branch occlusion, right eye: Secondary | ICD-10-CM | POA: Diagnosis not present

## 2023-07-21 DIAGNOSIS — H348321 Tributary (branch) retinal vein occlusion, left eye, with retinal neovascularization: Secondary | ICD-10-CM | POA: Diagnosis not present

## 2023-07-21 DIAGNOSIS — H3412 Central retinal artery occlusion, left eye: Secondary | ICD-10-CM | POA: Diagnosis not present

## 2023-07-21 DIAGNOSIS — H47013 Ischemic optic neuropathy, bilateral: Secondary | ICD-10-CM | POA: Diagnosis not present

## 2023-07-21 DIAGNOSIS — H353131 Nonexudative age-related macular degeneration, bilateral, early dry stage: Secondary | ICD-10-CM | POA: Diagnosis not present

## 2023-08-01 ENCOUNTER — Ambulatory Visit (INDEPENDENT_AMBULATORY_CARE_PROVIDER_SITE_OTHER): Admitting: Otolaryngology

## 2023-08-02 ENCOUNTER — Emergency Department (HOSPITAL_BASED_OUTPATIENT_CLINIC_OR_DEPARTMENT_OTHER)
Admission: EM | Admit: 2023-08-02 | Discharge: 2023-08-03 | Disposition: A | Discharge status: Home / Self Care | Attending: Emergency Medicine | Admitting: Emergency Medicine

## 2023-08-02 ENCOUNTER — Encounter (HOSPITAL_BASED_OUTPATIENT_CLINIC_OR_DEPARTMENT_OTHER): Payer: Self-pay | Admitting: Emergency Medicine

## 2023-08-02 ENCOUNTER — Other Ambulatory Visit: Payer: Self-pay

## 2023-08-02 DIAGNOSIS — S81802A Unspecified open wound, left lower leg, initial encounter: Secondary | ICD-10-CM | POA: Insufficient documentation

## 2023-08-02 DIAGNOSIS — I10 Essential (primary) hypertension: Secondary | ICD-10-CM | POA: Insufficient documentation

## 2023-08-02 DIAGNOSIS — Z7901 Long term (current) use of anticoagulants: Secondary | ICD-10-CM | POA: Diagnosis not present

## 2023-08-02 DIAGNOSIS — Z91040 Latex allergy status: Secondary | ICD-10-CM | POA: Insufficient documentation

## 2023-08-02 DIAGNOSIS — Z79899 Other long term (current) drug therapy: Secondary | ICD-10-CM | POA: Diagnosis not present

## 2023-08-02 DIAGNOSIS — R1011 Right upper quadrant pain: Secondary | ICD-10-CM | POA: Diagnosis not present

## 2023-08-02 DIAGNOSIS — R600 Localized edema: Secondary | ICD-10-CM | POA: Insufficient documentation

## 2023-08-02 DIAGNOSIS — X58XXXA Exposure to other specified factors, initial encounter: Secondary | ICD-10-CM | POA: Diagnosis not present

## 2023-08-02 LAB — URINALYSIS, ROUTINE W REFLEX MICROSCOPIC
Bilirubin Urine: NEGATIVE
Glucose, UA: >=500 mg/dL — AB
Hgb urine dipstick: NEGATIVE
Ketones, ur: NEGATIVE mg/dL
Leukocytes,Ua: NEGATIVE
Nitrite: NEGATIVE
Protein, ur: NEGATIVE mg/dL
Specific Gravity, Urine: <=1.005 (ref 1.005–1.030)
pH: 5.5 (ref 5.0–8.0)

## 2023-08-02 LAB — URINALYSIS, MICROSCOPIC (REFLEX)

## 2023-08-02 LAB — CBC WITH DIFFERENTIAL/PLATELET
Abs Immature Granulocytes: 0.68 K/uL — ABNORMAL HIGH (ref 0.00–0.07)
Basophils Absolute: 0.1 K/uL (ref 0.0–0.1)
Basophils Relative: 1 %
Eosinophils Absolute: 0 K/uL (ref 0.0–0.5)
Eosinophils Relative: 0 %
HCT: 40.9 % (ref 36.0–46.0)
Hemoglobin: 13.7 g/dL (ref 12.0–15.0)
Immature Granulocytes: 8 %
Lymphocytes Relative: 11 %
Lymphs Abs: 1 K/uL (ref 0.7–4.0)
MCH: 32 pg (ref 26.0–34.0)
MCHC: 33.5 g/dL (ref 30.0–36.0)
MCV: 95.6 fL (ref 80.0–100.0)
Monocytes Absolute: 0.4 K/uL (ref 0.1–1.0)
Monocytes Relative: 4 %
Neutro Abs: 7 K/uL (ref 1.7–7.7)
Neutrophils Relative %: 76 %
Platelets: 132 K/uL — ABNORMAL LOW (ref 150–400)
RBC: 4.28 MIL/uL (ref 3.87–5.11)
RDW: 21.1 % — ABNORMAL HIGH (ref 11.5–15.5)
Smear Review: NORMAL
WBC: 9.1 K/uL (ref 4.0–10.5)
nRBC: 0.2 % (ref 0.0–0.2)

## 2023-08-02 LAB — COMPREHENSIVE METABOLIC PANEL WITH GFR
ALT: 53 U/L — ABNORMAL HIGH (ref 0–44)
AST: 26 U/L (ref 15–41)
Albumin: 4.1 g/dL (ref 3.5–5.0)
Alkaline Phosphatase: 39 U/L (ref 38–126)
Anion gap: 14 (ref 5–15)
BUN: 34 mg/dL — ABNORMAL HIGH (ref 8–23)
CO2: 24 mmol/L (ref 22–32)
Calcium: 8.9 mg/dL (ref 8.9–10.3)
Chloride: 101 mmol/L (ref 98–111)
Creatinine, Ser: 1.28 mg/dL — ABNORMAL HIGH (ref 0.44–1.00)
GFR, Estimated: 40 mL/min — ABNORMAL LOW (ref >60)
Glucose, Bld: 174 mg/dL — ABNORMAL HIGH (ref 70–99)
Potassium: 4.4 mmol/L (ref 3.5–5.1)
Sodium: 139 mmol/L (ref 135–145)
Total Bilirubin: 0.4 mg/dL (ref 0.0–1.2)
Total Protein: 6 g/dL — ABNORMAL LOW (ref 6.5–8.1)

## 2023-08-02 LAB — LIPASE, BLOOD: Lipase: 36 U/L (ref 11–51)

## 2023-08-02 LAB — PRO BRAIN NATRIURETIC PEPTIDE: Pro Brain Natriuretic Peptide: 629 pg/mL — ABNORMAL HIGH (ref <300.0)

## 2023-08-02 NOTE — ED Provider Notes (Signed)
 [facility] Provider Note   CSN: [id number] Arrival date & time: 08/02/23  2034     Patient presents with: Leg Swelling   [name] is a 89 y.o. female. HPI Patient is an 89 year old female previous tree of giant cell arteritis currently taking prednisone  and tapering down, prediabetes, GERD, MVP, HTN, DVT currently on Eliquis  to the ED today for concerns for bilateral lower leg edema that has been ongoing for the last month with a new wound noted to her medial aspect of her left lower leg.  Reports that she is also had a new onset of right sided upper quadrant abdominal pain that been present for the last 2 days, worse with movement.  Daughter at bedside believes that this is likely secondary to sitting down for long period of time.  Accompanied by daughter who states that she has been very faithful to take her blood thinners, no missed doses.  Denies fever, headache, chest pain, shortness of breath, nausea, vomiting, dysuria  Prior to Admission medications   Medication Sig Start Date End Date Taking? Authorizing Provider  acetaminophen  (TYLENOL ) 500 MG tablet Take 500 mg by mouth every 6 (six) hours as needed for fever, moderate pain (pain score 4-6) or mild pain (pain score 1-3).    [provider]  APIXABAN  (ELIQUIS ) VTE STARTER PACK (10MG  AND 5MG ) Take as directed on package: start with two-5mg  tablets twice daily for 7 days. On day 8, switch to one-5mg  tablet twice daily. 05/12/23   Elgergawy, Dawood S, MD  Biotin 1000 MCG tablet Take 1,000 mcg by mouth at bedtime.    [provider]  CALCIUM MAGNESIUM  ZINC PO Take 1 tablet by mouth at bedtime.    [provider]  carvedilol  (COREG ) 6.25 MG tablet Take 1 tablet (6.25 mg total) by mouth 2 (two) times daily. 06/16/23   Lavona Agent, MD  Cholecalciferol  (VITAMIN D -3 PO) Take 1 tablet by mouth at bedtime.    [provider]  docusate sodium  (COLACE)  100 MG capsule Take 1 capsule (100 mg total) by mouth 2 (two) times daily as needed for mild constipation. 05/12/23   Elgergawy, Brayton RAMAN, MD  empagliflozin  (JARDIANCE ) 10 MG TABS tablet Take 1 tablet (10 mg total) by mouth daily. 05/12/23   Elgergawy, Brayton RAMAN, MD  furosemide  (LASIX ) 20 MG tablet Take 1 tablet (20 mg total) by mouth daily. 06/16/23   Lavona Agent, MD  Homeopathic Products (CALENDULA EX) Apply 1 Application topically as needed.    [provider]  IRON, FERROUS SULFATE, PO Take 65 capsules by mouth.    [provider]  levothyroxine  (SYNTHROID ) 75 MCG tablet Take 75 mcg by mouth daily before breakfast. TAKE 1 TABLET IN THE MORNING ON AN EMPTY STOMACH MON-SAT AND 1/2 TABLET ON SUNDAY    [provider]  losartan  (COZAAR ) 25 MG tablet Take 1 tablet (25 mg total) by mouth daily. 05/13/23   Elgergawy, Brayton RAMAN, MD  MAGNESIUM  PO Take 1 tablet by mouth at bedtime.    [provider]  melatonin 5 MG TABS Take 1 tablet (5 mg total) by mouth at bedtime. 05/12/23   Elgergawy, Brayton RAMAN, MD  mirtazapine  (REMERON  SOL-TAB) 15 MG disintegrating tablet Dissolve 0.5 tablets (7.5 mg total) under the tongue at bedtime. Patient not taking: Reported on 06/16/2023 05/12/23   Elgergawy, Brayton RAMAN, MD  nystatin  (MYCOSTATIN ) 100000 UNIT/ML suspension Take 5 mLs (500,000 Units total) by mouth 4 (four) times  daily. 05/12/23   Elgergawy, Brayton RAMAN, MD  pantoprazole  (PROTONIX ) 40 MG tablet Take 1 tablet (40 mg total) by mouth daily. 05/13/23   Elgergawy, Brayton RAMAN, MD  Polyethyl Glycol-Propyl Glycol (SYSTANE OP) Apply 1 drop to eye as needed. Both eyes    [provider]  predniSONE  (DELTASONE ) 20 MG tablet Take 3 tablets (60 mg total) by mouth daily with breakfast. 05/13/23   Elgergawy, Brayton RAMAN, MD  Probiotic Product (PROBIOTIC BLEND PO) Take by mouth.    [provider]  sulfamethoxazole -trimethoprim  (BACTRIM  DS) 800-160 MG tablet Take 1 tablet by mouth 3 (three) times a  week. 05/12/23   Elgergawy, Brayton RAMAN, MD    Allergies: Ampicillin, Elastic bandages & [zinc], Sulfa  antibiotics, and Latex    Review of Systems  Updated Vital Signs BP (!) 114/56 (BP Location: Right Arm)   Pulse 85   Temp 97.8 F (36.6 C)   Resp 18   Ht 4' 10 (1.473 m)   Wt 64.7 kg   SpO2 95%   BMI 29.81 kg/m   Physical Exam Vitals and nursing note reviewed.  Constitutional:      General: She is not in acute distress.    Appearance: Normal appearance. She is not ill-appearing or diaphoretic.  HENT:     Head: Normocephalic and atraumatic.  Eyes:     General: No scleral icterus.       Right eye: No discharge.        Left eye: No discharge.     Extraocular Movements: Extraocular movements intact.     Conjunctiva/sclera: Conjunctivae normal.  Cardiovascular:     Rate and Rhythm: Normal rate and regular rhythm.     Pulses: Normal pulses.     Heart sounds: Normal heart sounds. No murmur heard.    No friction rub. No gallop.  Pulmonary:     Effort: Pulmonary effort is normal. No respiratory distress.     Breath sounds: No stridor. No wheezing, rhonchi or rales.  Abdominal:     General: Abdomen is flat. There is no distension.     Palpations: Abdomen is soft.     Tenderness: There is abdominal tenderness (Mild right upper quadrant abdominal tenderness noted to palpation). There is no right CVA tenderness, left CVA tenderness, guarding or rebound.  Musculoskeletal:        General: No tenderness.     Cervical back: Normal range of motion and neck supple. No rigidity.     Right lower leg: Edema present.     Left lower leg: Edema present.  Skin:    General: Skin is warm and dry.     Findings: No bruising or lesion.  Neurological:     General: No focal deficit present.     Mental Status: She is alert and oriented to person, place, and time. Mental status is at baseline.     Sensory: No sensory deficit.     Motor: No weakness.     Gait: Gait normal.  Psychiatric:         Mood and Affect: Mood normal.     (all labs ordered are listed, but only abnormal results are displayed) Labs Reviewed  CBC WITH DIFFERENTIAL/PLATELET - Abnormal; Notable for the following components:      Result Value   RDW 21.1 (*)    Platelets 132 (*)    Abs Immature Granulocytes 0.68 (*)    All other components within normal limits  COMPREHENSIVE METABOLIC PANEL WITH GFR - Abnormal; Notable for  the following components:   Glucose, Bld 174 (*)    BUN 34 (*)    Creatinine, Ser 1.28 (*)    Total Protein 6.0 (*)    ALT 53 (*)    GFR, Estimated 40 (*)    All other components within normal limits  URINALYSIS, ROUTINE W REFLEX MICROSCOPIC - Abnormal; Notable for the following components:   Glucose, UA >=500 (*)    All other components within normal limits  PRO BRAIN NATRIURETIC PEPTIDE - Abnormal; Notable for the following components:   Pro Brain Natriuretic Peptide 629.0 (*)    All other components within normal limits  URINALYSIS, MICROSCOPIC (REFLEX) - Abnormal; Notable for the following components:   Bacteria, UA RARE (*)    All other components within normal limits  LIPASE, BLOOD    EKG: None  Radiology: No results found.  Procedures   Medications Ordered in the ED - No data to display    Medical Decision Making Amount and/or Complexity of Data Reviewed Labs: ordered. ECG/medicine tests: ordered.   This patient is a 89 year old female who presents to the ED for concern of lower leg edema and leg wound as well as right sided upper abdominal pain.  Edema present for the last couple of months and leg wound present for the last 2 weeks.  Currently taking TMP-SMX.  And noted to have some right abdominal pain that she was might be musculoskeletal as she has been sitting in a cramped position in her chair.  Being treated for giant cell arteritis with prednisone .  On physical exam, patient is in no acute distress, afebrile, alert and orient x 4, speaking in full  sentences, nontachypneic, nontachycardic.  Noted to have bilateral lower leg edema with weeping present.  Leg wound noted to left medial calf, appears to be noninfectious at this time.  Good DP pulses bilaterally.  Good sensation.  LCTAB, RRR.  Unremarkable exam otherwise.  With patient presentation, will evaluate with basic labs as well as BNP for heart failure.  Labs note a mildly elevated increase in her creatinine, which is likely secondary to dehydration with patient saying that she has not drink water and only drinks juice and has not drink much at all today.  Would have her follow-up with her PCP to have labs redrawn and reevaluate the platelets and creatinine and kidney function as well as to monitor her wound.  Labs otherwise unremarkable.  With her already on TMP-SMX do not believe any need to put on additional antibiotics at this time.  Low suspicion for emergent conditions at this time.  Will have her wrap her leg and into boots considering that she has a hard time wearing compression stockings due to the weeping.  Differential diagnoses prior to evaluation: The emergent differential diagnosis includes, but is not limited to, DVT, heart failure, PE, ACS, cellulitis, osteomyelitis. This is not an exhaustive differential.   Past Medical History / Co-morbidities / Social History: DVT, CAD, GERD, osteoarthritis, rosacea, MVP  Additional history: Chart reviewed. Pertinent results include:   Last saw PCP on 07/14/2023  Lab Tests/Imaging studies: I personally interpreted labs/imaging and the pertinent results include:    CBC notes a decreased platelet count from previous. CMP shows elevated creatinine and decreased GFR when compared to previous, likely secondary to dehydration Lipase unremarkable UA unremarkable BNP is 629, age-adjusted is within normal range  Cardiac monitoring: EKG obtained and interpreted by myself and attending physician which shows: Normal sinus rhythm    Medications:  I have reviewed the patients home medicines and have made adjustments as needed.  Critical Interventions: None  Social Determinants of Health: Notably is good follow-up with PCP  Disposition: After consideration of the diagnostic results and the patients response to treatment, I feel that the patient would benefit from discharge and treatment as above.   emergency department workup does not suggest an emergent condition requiring admission or immediate intervention beyond what has been performed at this time. The plan is: Follow-up with PCP for labs redrawn, return to the ED for new or worsening symptoms, continue with hydration. The patient is safe for discharge and has been instructed to return immediately for worsening symptoms, change in symptoms or any other concerns.   Final diagnoses:  Leg edema  Wound of left lower extremity, initial encounter    ED Discharge Orders     None          Beola Terrall GORMAN DEVONNA 08/02/23 [id number]    Dreama Longs, MD 08/03/23 1126

## 2023-08-02 NOTE — Discharge Instructions (Addendum)
 You were seen today for leg edema as well as for wound to your left lower leg.  You are already taking an antibiotic which is great coverage for any infection that may be at risk for.  Please be sure to follow-up with your PCP as well as have them recheck your creatinine to ensure that it is not continually elevating.  Please be sure to continue to hydrate well with water.  Taking medications as prescribed.  Return to the ED for any new or worsening symptoms which would include chest pain, close of breath, fever.  Please wrap your legs in Unna boot which she can find any pharmacy.  Change this every 2 to 3 days.

## 2023-08-02 NOTE — ED Triage Notes (Signed)
 Pt c/o swelling, redness and weeping to BLE, LT worse than RT; ulcerated area noted to LT lower leg; Pt's family member reports pt stool darker than normal

## 2023-08-05 DIAGNOSIS — S81802A Unspecified open wound, left lower leg, initial encounter: Secondary | ICD-10-CM | POA: Diagnosis not present

## 2023-08-05 DIAGNOSIS — I8310 Varicose veins of unspecified lower extremity with inflammation: Secondary | ICD-10-CM | POA: Diagnosis not present

## 2023-08-12 DIAGNOSIS — Z09 Encounter for follow-up examination after completed treatment for conditions other than malignant neoplasm: Secondary | ICD-10-CM | POA: Diagnosis not present

## 2023-08-12 DIAGNOSIS — E559 Vitamin D deficiency, unspecified: Secondary | ICD-10-CM | POA: Diagnosis not present

## 2023-08-12 DIAGNOSIS — L989 Disorder of the skin and subcutaneous tissue, unspecified: Secondary | ICD-10-CM | POA: Diagnosis not present

## 2023-08-12 DIAGNOSIS — R899 Unspecified abnormal finding in specimens from other organs, systems and tissues: Secondary | ICD-10-CM | POA: Diagnosis not present

## 2023-08-12 DIAGNOSIS — Z6834 Body mass index (BMI) 34.0-34.9, adult: Secondary | ICD-10-CM | POA: Diagnosis not present

## 2023-08-21 DIAGNOSIS — H35371 Puckering of macula, right eye: Secondary | ICD-10-CM | POA: Diagnosis not present

## 2023-08-21 DIAGNOSIS — H3412 Central retinal artery occlusion, left eye: Secondary | ICD-10-CM | POA: Diagnosis not present

## 2023-08-21 DIAGNOSIS — H348321 Tributary (branch) retinal vein occlusion, left eye, with retinal neovascularization: Secondary | ICD-10-CM | POA: Diagnosis not present

## 2023-08-21 DIAGNOSIS — H34231 Retinal artery branch occlusion, right eye: Secondary | ICD-10-CM | POA: Diagnosis not present

## 2023-08-21 DIAGNOSIS — H47013 Ischemic optic neuropathy, bilateral: Secondary | ICD-10-CM | POA: Diagnosis not present

## 2023-08-21 DIAGNOSIS — H35372 Puckering of macula, left eye: Secondary | ICD-10-CM | POA: Diagnosis not present

## 2023-08-21 DIAGNOSIS — H353131 Nonexudative age-related macular degeneration, bilateral, early dry stage: Secondary | ICD-10-CM | POA: Diagnosis not present

## 2023-08-29 DIAGNOSIS — R768 Other specified abnormal immunological findings in serum: Secondary | ICD-10-CM | POA: Diagnosis not present

## 2023-08-29 DIAGNOSIS — R7989 Other specified abnormal findings of blood chemistry: Secondary | ICD-10-CM | POA: Diagnosis not present

## 2023-08-29 DIAGNOSIS — K219 Gastro-esophageal reflux disease without esophagitis: Secondary | ICD-10-CM | POA: Diagnosis not present

## 2023-08-29 DIAGNOSIS — E669 Obesity, unspecified: Secondary | ICD-10-CM | POA: Diagnosis not present

## 2023-08-29 DIAGNOSIS — M170 Bilateral primary osteoarthritis of knee: Secondary | ICD-10-CM | POA: Diagnosis not present

## 2023-08-29 DIAGNOSIS — M316 Other giant cell arteritis: Secondary | ICD-10-CM | POA: Diagnosis not present

## 2023-08-29 DIAGNOSIS — R7303 Prediabetes: Secondary | ICD-10-CM | POA: Diagnosis not present

## 2023-08-29 DIAGNOSIS — Z6832 Body mass index (BMI) 32.0-32.9, adult: Secondary | ICD-10-CM | POA: Diagnosis not present

## 2023-08-29 DIAGNOSIS — Z13220 Encounter for screening for lipoid disorders: Secondary | ICD-10-CM | POA: Diagnosis not present

## 2023-09-02 ENCOUNTER — Telehealth: Payer: Self-pay | Admitting: Vascular Surgery

## 2023-09-02 NOTE — Telephone Encounter (Signed)
 Calling to r/s appt 11/18 w/ [facility] due to being OOO

## 2023-09-09 ENCOUNTER — Ambulatory Visit: Admitting: Physician Assistant

## 2023-09-10 DIAGNOSIS — G4700 Insomnia, unspecified: Secondary | ICD-10-CM | POA: Diagnosis not present

## 2023-09-10 DIAGNOSIS — I2510 Atherosclerotic heart disease of native coronary artery without angina pectoris: Secondary | ICD-10-CM | POA: Diagnosis not present

## 2023-09-10 DIAGNOSIS — I872 Venous insufficiency (chronic) (peripheral): Secondary | ICD-10-CM | POA: Diagnosis not present

## 2023-09-10 DIAGNOSIS — I5030 Unspecified diastolic (congestive) heart failure: Secondary | ICD-10-CM | POA: Diagnosis not present

## 2023-09-10 DIAGNOSIS — I82401 Acute embolism and thrombosis of unspecified deep veins of right lower extremity: Secondary | ICD-10-CM | POA: Diagnosis not present

## 2023-09-10 DIAGNOSIS — M316 Other giant cell arteritis: Secondary | ICD-10-CM | POA: Diagnosis not present

## 2023-09-10 DIAGNOSIS — M170 Bilateral primary osteoarthritis of knee: Secondary | ICD-10-CM | POA: Diagnosis not present

## 2023-09-10 DIAGNOSIS — I447 Left bundle-branch block, unspecified: Secondary | ICD-10-CM | POA: Diagnosis not present

## 2023-09-10 DIAGNOSIS — I2699 Other pulmonary embolism without acute cor pulmonale: Secondary | ICD-10-CM | POA: Diagnosis not present

## 2023-09-10 DIAGNOSIS — L97821 Non-pressure chronic ulcer of other part of left lower leg limited to breakdown of skin: Secondary | ICD-10-CM | POA: Diagnosis not present

## 2023-09-10 DIAGNOSIS — I110 Hypertensive heart disease with heart failure: Secondary | ICD-10-CM | POA: Diagnosis not present

## 2023-09-10 DIAGNOSIS — S81811D Laceration without foreign body, right lower leg, subsequent encounter: Secondary | ICD-10-CM | POA: Diagnosis not present

## 2023-09-11 ENCOUNTER — Other Ambulatory Visit: Payer: Self-pay | Admitting: Cardiology

## 2023-09-15 ENCOUNTER — Encounter: Attending: Physician Assistant | Admitting: Physician Assistant

## 2023-09-15 DIAGNOSIS — I110 Hypertensive heart disease with heart failure: Secondary | ICD-10-CM | POA: Diagnosis not present

## 2023-09-15 DIAGNOSIS — I890 Lymphedema, not elsewhere classified: Secondary | ICD-10-CM | POA: Insufficient documentation

## 2023-09-15 DIAGNOSIS — L97822 Non-pressure chronic ulcer of other part of left lower leg with fat layer exposed: Secondary | ICD-10-CM | POA: Diagnosis not present

## 2023-09-15 DIAGNOSIS — I5042 Chronic combined systolic (congestive) and diastolic (congestive) heart failure: Secondary | ICD-10-CM | POA: Insufficient documentation

## 2023-09-15 DIAGNOSIS — L97812 Non-pressure chronic ulcer of other part of right lower leg with fat layer exposed: Secondary | ICD-10-CM | POA: Diagnosis not present

## 2023-09-15 DIAGNOSIS — I87333 Chronic venous hypertension (idiopathic) with ulcer and inflammation of bilateral lower extremity: Secondary | ICD-10-CM | POA: Diagnosis not present

## 2023-09-17 DIAGNOSIS — R609 Edema, unspecified: Secondary | ICD-10-CM | POA: Diagnosis not present

## 2023-09-17 DIAGNOSIS — N289 Disorder of kidney and ureter, unspecified: Secondary | ICD-10-CM | POA: Diagnosis not present

## 2023-09-17 DIAGNOSIS — Z86711 Personal history of pulmonary embolism: Secondary | ICD-10-CM | POA: Diagnosis not present

## 2023-09-17 DIAGNOSIS — Z6835 Body mass index (BMI) 35.0-35.9, adult: Secondary | ICD-10-CM | POA: Diagnosis not present

## 2023-09-19 NOTE — Progress Notes (Signed)
 Cardiology Clinic Note   Patient Name: [name] Date of Encounter: 09/22/2023  Primary Care Provider:  Okey Carlin Redbird, MD Primary Cardiologist:  None  Patient Profile    [name] 89 year old female presents to the clinic today for follow-up evaluation of her acute systolic CHF and cardiomyopathy.  Past Medical History    Past Medical History:  Diagnosis Date   Acute blood loss anemia    Arthritis    Benign essential HTN    pt denies    Constipation    GERD (gastroesophageal reflux disease)    infrequently - otc med as needed   Heart murmur    leacky heart valveagrivated by caffiene / activity - no heart studies have been done per pt   Hyponatremia    Hypothyroidism    Leukocytosis    MVP (mitral valve prolapse)    Primary osteoarthritis of right knee    Rosacea    Shingles 10/2017   Unsteady gait    Past Surgical History:  Procedure Laterality Date   ARTERY BIOPSY Left 05/09/2023   Procedure: BIOPSY TEMPORAL ARTERY;  Surgeon: Gretta Lonni PARAS, MD;  Location: [facility];  Service: Vascular;  Laterality: Left;   BUNIONECTOMY     CHOLECYSTECTOMY     COLONOSCOPY     2004 & 06/17/12   FINGER SURGERY     rt thumb   TOTAL KNEE ARTHROPLASTY Right 02/02/2015   Procedure: TOTAL RIGHT KNEE ARTHROPLASTY;  Surgeon: Reyes Billing, MD;  Location: [facility];  Service: Orthopedics;  Laterality: Right;   TOTAL KNEE ARTHROPLASTY Left 09/21/2015   Procedure: LEFT TOTAL KNEE ARTHROPLASTY;  Surgeon: Reyes Billing, MD;  Location: [facility];  Service: Orthopedics;  Laterality: Left;   TUBAL LIGATION      Allergies  Allergies  Allergen Reactions   Ampicillin Cough    Has patient had a PCN reaction causing immediate rash, facial/tongue/throat swelling, SOB or lightheadedness with hypotension: No Has patient had a PCN reaction causing severe rash involving mucus membranes or skin necrosis: No Has patient had a PCN reaction that required hospitalization No Has  patient had a PCN reaction occurring within the last 10 years: No If all of the above answers are NO, then may proceed with Cephalosporin use.   Elastic Bandages & [Zinc]    Sulfa  Antibiotics Cough   Latex Rash    History of Present Illness    [name] has a PMH of HTN, GERD, cardiac murmur, cardiomyopathy, LBBB, and elevated coronary artery calcium scoring.  She was seen and evaluated by Dr. Lavona on 06/16/2023.  During that time she reported that she had been in the hospital.  She was treated with steroids for her TA.  Echocardiogram showed an EF of 40% and she was noted to have LBBB.  She had not been previously evaluated by cardiology.  She reported that she would get around slowly in her house.  She was having problems with balance and her joints.  She was using a walker since being hospitalized.  She was living alone.  Her daughter was staying with her.  She was doing light house chores.  She did note some increased lower extremity swelling.  She denied chest pain, discomfort, arm and neck pain.  She denied shortness of breath, PND and orthopnea.  She denied palpitations and presyncope.  She was prescribed furosemide  for couple days to see if this would help with lower extremity edema.  It was felt that her lower  extremity swelling may be related to her steroids.  Her carvedilol  was increased to 6.25 mg twice daily.  Statin therapy was avoided due to elevated liver enzymes.  She was also noted to have elevated coronary calcium scoring.  This was felt to be incidental finding.  She denied chest discomfort and further cardiac workup/ischemic evaluation was deferred.  She developed lower extremity wounds and was seen and is being treated for this by wound care.  She presents to the clinic today for evaluation and states she continues to be somewhat physically active.  She presents with her daughters.  Her daughters report that she was more physically active when she was doing  physical therapy more regularly.  She is seeing wound care for management of her lower extremity swelling and weeping ulcers.  She continues to taper her steroids for TA.  We reviewed the importance of not over hydrating and reducing sodium in her diet.  They expressed understanding.  I have asked her to increase her physical activity.  We will continue her current medication regimen.  Her recent lab work is stable from her PCP.  I will plan follow-up in 3 to 4 months.  Today she denies chest pain, shortness of breath,  palpitations, melena, hematuria, hemoptysis, diaphoresis, weakness, presyncope, syncope, orthopnea, and PND.   Home Medications    Prior to Admission medications   Medication Sig Start Date End Date Taking? Authorizing Provider  acetaminophen  (TYLENOL ) 500 MG tablet Take 500 mg by mouth every 6 (six) hours as needed for fever, moderate pain (pain score 4-6) or mild pain (pain score 1-3).    [provider]  APIXABAN  (ELIQUIS ) VTE STARTER PACK (10MG  AND 5MG ) Take as directed on package: start with two-5mg  tablets twice daily for 7 days. On day 8, switch to one-5mg  tablet twice daily. 05/12/23   Elgergawy, Dawood S, MD  Biotin 1000 MCG tablet Take 1,000 mcg by mouth at bedtime.    [provider]  CALCIUM MAGNESIUM  ZINC PO Take 1 tablet by mouth at bedtime.    [provider]  carvedilol  (COREG ) 6.25 MG tablet Take 1 tablet (6.25 mg total) by mouth 2 (two) times daily. 06/16/23   Lavona Agent, MD  Cholecalciferol  (VITAMIN D -3 PO) Take 1 tablet by mouth at bedtime.    [provider]  docusate sodium  (COLACE) 100 MG capsule Take 1 capsule (100 mg total) by mouth 2 (two) times daily as needed for mild constipation. 05/12/23   Elgergawy, Brayton RAMAN, MD  empagliflozin  (JARDIANCE ) 10 MG TABS tablet Take 1 tablet (10 mg total) by mouth daily. 05/12/23   Elgergawy, Brayton RAMAN, MD  furosemide  (LASIX ) 20 MG tablet TAKE 1 TABLET BY MOUTH EVERY DAY 09/11/23   Lavona Agent, MD  Homeopathic Products (CALENDULA EX) Apply 1 Application topically as needed.    [provider]  IRON, FERROUS SULFATE, PO Take 65 capsules by mouth.    [provider]  levothyroxine  (SYNTHROID ) 75 MCG tablet Take 75 mcg by mouth daily before breakfast. TAKE 1 TABLET IN THE MORNING ON AN EMPTY STOMACH MON-SAT AND 1/2 TABLET ON SUNDAY    [provider]  losartan  (COZAAR ) 25 MG tablet Take 1 tablet (25 mg total) by mouth daily. 05/13/23   Elgergawy, Brayton RAMAN, MD  MAGNESIUM  PO Take 1 tablet by mouth at bedtime.    [provider]  melatonin 5 MG TABS Take 1 tablet (5 mg total) by mouth at bedtime. 05/12/23   Elgergawy, Brayton RAMAN,  MD  mirtazapine  (REMERON  SOL-TAB) 15 MG disintegrating tablet Dissolve 0.5 tablets (7.5 mg total) under the tongue at bedtime. Patient not taking: Reported on 06/16/2023 05/12/23   Elgergawy, Brayton RAMAN, MD  nystatin  (MYCOSTATIN ) 100000 UNIT/ML suspension Take 5 mLs (500,000 Units total) by mouth 4 (four) times daily. 05/12/23   Elgergawy, Brayton RAMAN, MD  pantoprazole  (PROTONIX ) 40 MG tablet Take 1 tablet (40 mg total) by mouth daily. 05/13/23   Elgergawy, Brayton RAMAN, MD  Polyethyl Glycol-Propyl Glycol (SYSTANE OP) Apply 1 drop to eye as needed. Both eyes    [provider]  predniSONE  (DELTASONE ) 20 MG tablet Take 3 tablets (60 mg total) by mouth daily with breakfast. 05/13/23   Elgergawy, Brayton RAMAN, MD  Probiotic Product (PROBIOTIC BLEND PO) Take by mouth.    [provider]  sulfamethoxazole -trimethoprim  (BACTRIM  DS) 800-160 MG tablet Take 1 tablet by mouth 3 (three) times a week. 05/12/23   Elgergawy, Brayton RAMAN, MD    Family History    Family History  Problem Relation Age of Onset   Stroke Mother        ?   Congestive Heart Failure Father    Rheum arthritis Sister    Lung cancer Brother    Prostate cancer Brother    Aneurysm Brother        ?   Lung cancer Brother    Other Brother        open heart surgery   Breast  cancer Sister    Dementia Sister    Fibromyalgia Sister    She indicated that her mother is deceased. She indicated that her father is deceased. She indicated that all of her three sisters are alive. She indicated that both of her brothers are deceased.  Social History    Social History   Socioeconomic History   Marital status: Widowed    Spouse name: Not on file   Number of children: 2   Years of education: Not on file   Highest education level: High school graduate  Occupational History   Not on file  Tobacco Use   Smoking status: Never   Smokeless tobacco: Never  Vaping Use   Vaping status: Never Used  Substance and Sexual Activity   Alcohol  use: No   Drug use: No   Sexual activity: Not on file  Other Topics Concern   Not on file  Social History Narrative   Lives at home alone   Right handed   Social Drivers of Health   Financial Resource Strain: Not on file  Food Insecurity: No Food Insecurity (05/12/2023)   Hunger Vital Sign    Worried About Running Out of Food in the Last Year: Never true    Ran Out of Food in the Last Year: Never true  Transportation Needs: No Transportation Needs (05/12/2023)   PRAPARE - Administrator, Civil Service (Medical): No    Lack of Transportation (Non-Medical): No  Physical Activity: Not on file  Stress: No Stress Concern Present (04/16/2022)   Received from [facility] of Occupational Health - Occupational Stress Questionnaire    Feeling of Stress : Only a little  Social Connections: Unknown (05/12/2023)   Social Connection and Isolation Panel    Frequency of Communication with Friends and Family: Three times a week    Frequency of Social Gatherings with Friends and Family: Three times a week    Attends Religious Services: Patient unable to answer    Active  Member of Clubs or Organizations: Patient unable to answer    Attends Club or Organization Meetings: Patient unable to answer    Marital Status:  Patient unable to answer  Intimate Partner Violence: Unknown (05/12/2023)   Humiliation, Afraid, Rape, and Kick questionnaire    Fear of Current or Ex-Partner: No    Emotionally Abused: No    Physically Abused: Not on file    Sexually Abused: No     Review of Systems    General:  No chills, fever, night sweats or weight changes.  Cardiovascular:  No chest pain, dyspnea on exertion, edema, orthopnea, palpitations, paroxysmal nocturnal dyspnea. Dermatological: No rash, lesions/masses Respiratory: No cough, dyspnea Urologic: No hematuria, dysuria Abdominal:   No nausea, vomiting, diarrhea, bright red blood per rectum, melena, or hematemesis Neurologic:  No visual changes, wkns, changes in mental status. All other systems reviewed and are otherwise negative except as noted above.  Physical Exam    VS:  BP (!) 99/50   Pulse 83   Ht 4' 10 (1.473 m)   Wt 160 lb (72.6 kg)   SpO2 96%   BMI 33.44 kg/m  , BMI Body mass index is 33.44 kg/m. GEN: Well nourished, well developed, in no acute distress. HEENT: normal. Neck: Supple, no JVD, carotid bruits, or masses. Cardiac: RRR, 3/6 systolic murmur heard along apex , rubs, or gallops. No clubbing, cyanosis, bilateral lower extremity 1+ pitting edema.  Radials/DP/PT 2+ and equal bilaterally.  Respiratory:  Respirations regular and unlabored, clear to auscultation bilaterally. GI: Soft, nontender, nondistended, BS + x 4. MS: no deformity or atrophy. Skin: warm and dry, no rash. Neuro:  Strength and sensation are intact. Psych: Normal affect.  Accessory Clinical Findings    Recent Labs: 05/08/2023: TSH 6.647 05/12/2023: Magnesium  2.2 08/02/2023: ALT 53; BUN 34; Creatinine, Ser 1.28; Hemoglobin 13.7; Platelets 132; Potassium 4.4; Pro Brain Natriuretic Peptide 629.0; Sodium 139   Recent Lipid Panel    Component Value Date/Time   CHOL 174 05/08/2023 1700   TRIG 104 05/08/2023 1700   HDL 32 (L) 05/08/2023 1700   CHOLHDL 5.4 05/08/2023 1700    VLDL 21 05/08/2023 1700   LDLCALC 121 (H) 05/08/2023 1700         ECG personally reviewed by me today-none today.    Echocardiogram 05/08/2023   IMPRESSIONS     1. Normal left ventricular size. Left ventricular ejection fraction, by  estimation, is 40%. The left ventricle has mild to moderately decreased  function. The left ventricle demonstrates global hypokinesis worse in the  septum, septal-lateral dyssynchrony   consistent with LBBB. Left ventricular diastolic parameters are  consistent with Grade I diastolic dysfunction (impaired relaxation).   2. Right ventricular systolic function is normal. The right ventricular  size is normal. There is normal pulmonary artery systolic pressure. The  estimated right ventricular systolic pressure is 16.0 mmHg.   3. Bubble study negative, no PFO or ASD.   4. The mitral valve is degenerative. Mild mitral valve regurgitation. No  evidence of mitral stenosis. Moderate mitral annular calcification.   5. The aortic valve is tricuspid. Aortic valve regurgitation is mild. No  aortic stenosis is present.   6. The inferior vena cava is normal in size with greater than 50%  respiratory variability, suggesting right atrial pressure of 3 mmHg.   FINDINGS   Left Ventricle: Left ventricular ejection fraction, by estimation, is  40%. The left ventricle has mild to moderately decreased function. The  left ventricle demonstrates global  hypokinesis. The left ventricular  internal cavity size was normal in size.  There is no left ventricular hypertrophy. Left ventricular diastolic  parameters are consistent with Grade I diastolic dysfunction (impaired  relaxation).   Right Ventricle: The right ventricular size is normal. No increase in  right ventricular wall thickness. Right ventricular systolic function is  normal. There is normal pulmonary artery systolic pressure. The tricuspid  regurgitant velocity is 1.80 m/s, and   with an assumed right  atrial pressure of 3 mmHg, the estimated right  ventricular systolic pressure is 16.0 mmHg.   Left Atrium: Left atrial size was normal in size.   Right Atrium: Right atrial size was normal in size.   Pericardium: There is no evidence of pericardial effusion.   Mitral Valve: The mitral valve is degenerative in appearance. There is  mild calcification of the mitral valve leaflet(s). Moderate mitral annular  calcification. Mild mitral valve regurgitation. No evidence of mitral  valve stenosis.   Tricuspid Valve: The tricuspid valve is normal in structure. Tricuspid  valve regurgitation is trivial.   Aortic Valve: The aortic valve is tricuspid. Aortic valve regurgitation is  mild. Aortic regurgitation PHT measures 597 msec. No aortic stenosis is  present. Aortic valve peak gradient measures 5.7 mmHg.   Pulmonic Valve: The pulmonic valve was normal in structure. Pulmonic valve  regurgitation is not visualized.   Aorta: The aortic root is normal in size and structure.   Venous: The inferior vena cava is normal in size with greater than 50%  respiratory variability, suggesting right atrial pressure of 3 mmHg.   IAS/Shunts: Bubble study negative, no PFO or ASD. Agitated saline contrast  was given intravenously to evaluate for intracardiac shunting.      Assessment & Plan   1.  Combined systolic and diastolic CHF-no increased DOE or activity intolerance.  Tolerating carvedilol  well.  Furosemide  helped with reduction of lower extremity swelling.  This was previously felt to be exacerbated by steroid use for temporal arteritis.  Echocardiogram 05/08/2023 showed an LVEF of 40% and G1 DD.  LV global hypokinesis worsened septum and consistent with LBBB. \Heart healthy low-sodium diet Elevate lower extremities when not active Lower extremity support stockings Continue carvedilol  Continue furosemide  20 mg daily  Coronary artery disease-no chest pain today.  Denies exertional chest  discomfort.  Noted to have elevated coronary calcium score which is an incidental finding.  Statin therapy not initiated due to elevated liver enzymes. High-fiber diet Increase physical activity as tolerated Continue carvedilol   Cardiac murmur, MR-noted to have 3/6 systolic murmur heard along apex. Continue to monitor  Bilateral DVT, PE-reports compliance with apixaban .  Denies bleeding issues and trauma. Continue apixaban  Follows with PCP  Disposition: Follow-up with Dr. Lavona or me in 3-4 months.   Josefa HERO. Muhamad Serano NP-C     09/22/2023, 4:33 PM [facility] [address] [place], [place] [address] Office [phone]      I spent 14 minutes examining this patient, reviewing medications, and using patient centered shared decision making involving their cardiac care.   I spent  20 minutes reviewing past medical history,  medications, and prior cardiac tests.

## 2023-09-22 ENCOUNTER — Ambulatory Visit: Attending: General Practice | Admitting: General Practice

## 2023-09-22 ENCOUNTER — Encounter: Payer: Self-pay | Admitting: General Practice

## 2023-09-22 VITALS — BP 99/50 | HR 83 | Ht <= 58 in | Wt 160.0 lb

## 2023-09-22 DIAGNOSIS — R931 Abnormal findings on diagnostic imaging of heart and coronary circulation: Secondary | ICD-10-CM

## 2023-09-22 DIAGNOSIS — R600 Localized edema: Secondary | ICD-10-CM | POA: Diagnosis not present

## 2023-09-22 DIAGNOSIS — I5042 Chronic combined systolic (congestive) and diastolic (congestive) heart failure: Secondary | ICD-10-CM | POA: Diagnosis not present

## 2023-09-22 NOTE — Patient Instructions (Signed)
 Medication Instructions:  Your physician recommends that you continue on your current medications as directed. Please refer to the Current Medication list given to you today.  *If you need a refill on your cardiac medications before your next appointment, please call your pharmacy*  Lab Work: NONE If you have labs (blood work) drawn today and your tests are completely normal, you will receive your results only by: MyChart Message (if you have MyChart) OR A paper copy in the mail If you have any lab test that is abnormal or we need to change your treatment, we will call you to review the results.  Testing/Procedures: NONE  Follow-Up: At [facility], you and your health needs are our priority.  As part of our continuing mission to provide you with exceptional heart care, our providers are all part of one team.  This team includes your primary Cardiologist (physician) and Advanced Practice Providers or APPs (Physician Assistants and Nurse Practitioners) who all work together to provide you with the care you need, when you need it.  Your next appointment:   3-4 month(s)  Provider:   Dr. Lavona  or Josefa Beauvais, NP        We recommend signing up for the patient portal called MyChart.  Sign up information is provided on this After Visit Summary.  MyChart is used to connect with patients for Virtual Visits (Telemedicine).  Patients are able to view lab/test results, encounter notes, upcoming appointments, etc.  Non-urgent messages can be sent to your provider as well.   To learn more about what you can do with MyChart, go to [email or web address].   Other Instructions Please elevate your legs when you are sitting.

## 2023-09-23 ENCOUNTER — Encounter: Admitting: Physician Assistant

## 2023-09-23 DIAGNOSIS — L97812 Non-pressure chronic ulcer of other part of right lower leg with fat layer exposed: Secondary | ICD-10-CM | POA: Diagnosis not present

## 2023-09-23 DIAGNOSIS — I890 Lymphedema, not elsewhere classified: Secondary | ICD-10-CM | POA: Diagnosis not present

## 2023-09-23 DIAGNOSIS — L97822 Non-pressure chronic ulcer of other part of left lower leg with fat layer exposed: Secondary | ICD-10-CM | POA: Diagnosis not present

## 2023-10-06 ENCOUNTER — Encounter: Admitting: Physician Assistant

## 2023-10-06 ENCOUNTER — Ambulatory Visit: Admitting: Physician Assistant

## 2023-10-06 DIAGNOSIS — L97822 Non-pressure chronic ulcer of other part of left lower leg with fat layer exposed: Secondary | ICD-10-CM | POA: Diagnosis not present

## 2023-10-06 DIAGNOSIS — I890 Lymphedema, not elsewhere classified: Secondary | ICD-10-CM | POA: Diagnosis not present

## 2023-10-06 DIAGNOSIS — L97812 Non-pressure chronic ulcer of other part of right lower leg with fat layer exposed: Secondary | ICD-10-CM | POA: Diagnosis not present

## 2023-10-06 DIAGNOSIS — B9561 Methicillin susceptible Staphylococcus aureus infection as the cause of diseases classified elsewhere: Secondary | ICD-10-CM | POA: Diagnosis not present

## 2023-10-13 ENCOUNTER — Encounter: Attending: Physician Assistant | Admitting: Physician Assistant

## 2023-10-13 DIAGNOSIS — I87333 Chronic venous hypertension (idiopathic) with ulcer and inflammation of bilateral lower extremity: Secondary | ICD-10-CM | POA: Insufficient documentation

## 2023-10-13 DIAGNOSIS — I5042 Chronic combined systolic (congestive) and diastolic (congestive) heart failure: Secondary | ICD-10-CM | POA: Diagnosis not present

## 2023-10-13 DIAGNOSIS — L97822 Non-pressure chronic ulcer of other part of left lower leg with fat layer exposed: Secondary | ICD-10-CM | POA: Insufficient documentation

## 2023-10-13 DIAGNOSIS — I2510 Atherosclerotic heart disease of native coronary artery without angina pectoris: Secondary | ICD-10-CM | POA: Diagnosis not present

## 2023-10-13 DIAGNOSIS — L97812 Non-pressure chronic ulcer of other part of right lower leg with fat layer exposed: Secondary | ICD-10-CM | POA: Insufficient documentation

## 2023-10-13 DIAGNOSIS — I890 Lymphedema, not elsewhere classified: Secondary | ICD-10-CM | POA: Diagnosis not present

## 2023-10-13 DIAGNOSIS — I110 Hypertensive heart disease with heart failure: Secondary | ICD-10-CM | POA: Diagnosis not present

## 2023-10-14 ENCOUNTER — Other Ambulatory Visit (INDEPENDENT_AMBULATORY_CARE_PROVIDER_SITE_OTHER): Payer: Self-pay | Admitting: Physician Assistant

## 2023-10-14 DIAGNOSIS — I87333 Chronic venous hypertension (idiopathic) with ulcer and inflammation of bilateral lower extremity: Secondary | ICD-10-CM

## 2023-10-15 ENCOUNTER — Ambulatory Visit (INDEPENDENT_AMBULATORY_CARE_PROVIDER_SITE_OTHER)

## 2023-10-15 DIAGNOSIS — I87333 Chronic venous hypertension (idiopathic) with ulcer and inflammation of bilateral lower extremity: Secondary | ICD-10-CM | POA: Diagnosis not present

## 2023-10-21 ENCOUNTER — Encounter: Admitting: Physician Assistant

## 2023-10-21 DIAGNOSIS — L97812 Non-pressure chronic ulcer of other part of right lower leg with fat layer exposed: Secondary | ICD-10-CM | POA: Diagnosis not present

## 2023-10-21 DIAGNOSIS — I890 Lymphedema, not elsewhere classified: Secondary | ICD-10-CM | POA: Diagnosis not present

## 2023-10-21 DIAGNOSIS — L97822 Non-pressure chronic ulcer of other part of left lower leg with fat layer exposed: Secondary | ICD-10-CM | POA: Diagnosis not present

## 2023-10-28 ENCOUNTER — Encounter: Admitting: Physician Assistant

## 2023-10-28 DIAGNOSIS — I890 Lymphedema, not elsewhere classified: Secondary | ICD-10-CM | POA: Diagnosis not present

## 2023-10-28 DIAGNOSIS — L97822 Non-pressure chronic ulcer of other part of left lower leg with fat layer exposed: Secondary | ICD-10-CM | POA: Diagnosis not present

## 2023-11-03 ENCOUNTER — Other Ambulatory Visit: Payer: Self-pay

## 2023-11-03 DIAGNOSIS — I872 Venous insufficiency (chronic) (peripheral): Secondary | ICD-10-CM

## 2023-11-04 ENCOUNTER — Encounter: Admitting: Physician Assistant

## 2023-11-04 DIAGNOSIS — I890 Lymphedema, not elsewhere classified: Secondary | ICD-10-CM | POA: Diagnosis not present

## 2023-11-04 DIAGNOSIS — S51812A Laceration without foreign body of left forearm, initial encounter: Secondary | ICD-10-CM | POA: Diagnosis not present

## 2023-11-04 DIAGNOSIS — L97822 Non-pressure chronic ulcer of other part of left lower leg with fat layer exposed: Secondary | ICD-10-CM | POA: Diagnosis not present

## 2023-11-11 ENCOUNTER — Encounter: Attending: Physician Assistant | Admitting: Physician Assistant

## 2023-11-11 DIAGNOSIS — L97812 Non-pressure chronic ulcer of other part of right lower leg with fat layer exposed: Secondary | ICD-10-CM | POA: Diagnosis not present

## 2023-11-11 DIAGNOSIS — I890 Lymphedema, not elsewhere classified: Secondary | ICD-10-CM | POA: Diagnosis not present

## 2023-11-11 DIAGNOSIS — I5042 Chronic combined systolic (congestive) and diastolic (congestive) heart failure: Secondary | ICD-10-CM | POA: Diagnosis not present

## 2023-11-11 DIAGNOSIS — I2510 Atherosclerotic heart disease of native coronary artery without angina pectoris: Secondary | ICD-10-CM | POA: Insufficient documentation

## 2023-11-11 DIAGNOSIS — I87333 Chronic venous hypertension (idiopathic) with ulcer and inflammation of bilateral lower extremity: Secondary | ICD-10-CM | POA: Insufficient documentation

## 2023-11-11 DIAGNOSIS — L97822 Non-pressure chronic ulcer of other part of left lower leg with fat layer exposed: Secondary | ICD-10-CM | POA: Insufficient documentation

## 2023-11-11 DIAGNOSIS — I110 Hypertensive heart disease with heart failure: Secondary | ICD-10-CM | POA: Insufficient documentation

## 2023-11-13 DIAGNOSIS — H353112 Nonexudative age-related macular degeneration, right eye, intermediate dry stage: Secondary | ICD-10-CM | POA: Diagnosis not present

## 2023-11-13 DIAGNOSIS — H1131 Conjunctival hemorrhage, right eye: Secondary | ICD-10-CM | POA: Diagnosis not present

## 2023-11-13 DIAGNOSIS — H353131 Nonexudative age-related macular degeneration, bilateral, early dry stage: Secondary | ICD-10-CM | POA: Diagnosis not present

## 2023-11-13 DIAGNOSIS — M316 Other giant cell arteritis: Secondary | ICD-10-CM | POA: Diagnosis not present

## 2023-11-13 DIAGNOSIS — H35372 Puckering of macula, left eye: Secondary | ICD-10-CM | POA: Diagnosis not present

## 2023-11-13 DIAGNOSIS — H47013 Ischemic optic neuropathy, bilateral: Secondary | ICD-10-CM | POA: Diagnosis not present

## 2023-11-13 DIAGNOSIS — H348321 Tributary (branch) retinal vein occlusion, left eye, with retinal neovascularization: Secondary | ICD-10-CM | POA: Diagnosis not present

## 2023-11-13 DIAGNOSIS — H35371 Puckering of macula, right eye: Secondary | ICD-10-CM | POA: Diagnosis not present

## 2023-11-13 DIAGNOSIS — H3412 Central retinal artery occlusion, left eye: Secondary | ICD-10-CM | POA: Diagnosis not present

## 2023-11-13 DIAGNOSIS — H34231 Retinal artery branch occlusion, right eye: Secondary | ICD-10-CM | POA: Diagnosis not present

## 2023-11-17 DIAGNOSIS — R7303 Prediabetes: Secondary | ICD-10-CM | POA: Diagnosis not present

## 2023-11-17 DIAGNOSIS — M316 Other giant cell arteritis: Secondary | ICD-10-CM | POA: Diagnosis not present

## 2023-11-17 DIAGNOSIS — R7689 Other specified abnormal immunological findings in serum: Secondary | ICD-10-CM | POA: Diagnosis not present

## 2023-11-17 DIAGNOSIS — E669 Obesity, unspecified: Secondary | ICD-10-CM | POA: Diagnosis not present

## 2023-11-17 DIAGNOSIS — R7989 Other specified abnormal findings of blood chemistry: Secondary | ICD-10-CM | POA: Diagnosis not present

## 2023-11-17 DIAGNOSIS — Z13220 Encounter for screening for lipoid disorders: Secondary | ICD-10-CM | POA: Diagnosis not present

## 2023-11-17 DIAGNOSIS — K219 Gastro-esophageal reflux disease without esophagitis: Secondary | ICD-10-CM | POA: Diagnosis not present

## 2023-11-17 DIAGNOSIS — Z6833 Body mass index (BMI) 33.0-33.9, adult: Secondary | ICD-10-CM | POA: Diagnosis not present

## 2023-11-17 DIAGNOSIS — M170 Bilateral primary osteoarthritis of knee: Secondary | ICD-10-CM | POA: Diagnosis not present

## 2023-11-21 ENCOUNTER — Encounter: Admitting: Physician Assistant

## 2023-11-21 DIAGNOSIS — L97829 Non-pressure chronic ulcer of other part of left lower leg with unspecified severity: Secondary | ICD-10-CM | POA: Diagnosis not present

## 2023-11-21 DIAGNOSIS — I890 Lymphedema, not elsewhere classified: Secondary | ICD-10-CM | POA: Diagnosis not present

## 2023-11-21 DIAGNOSIS — L97822 Non-pressure chronic ulcer of other part of left lower leg with fat layer exposed: Secondary | ICD-10-CM | POA: Diagnosis not present

## 2023-11-25 ENCOUNTER — Encounter: Admitting: Vascular Surgery

## 2023-11-25 ENCOUNTER — Encounter (HOSPITAL_COMMUNITY)

## 2023-11-28 ENCOUNTER — Encounter: Admitting: Physician Assistant

## 2023-11-28 DIAGNOSIS — I890 Lymphedema, not elsewhere classified: Secondary | ICD-10-CM | POA: Diagnosis not present

## 2023-11-28 DIAGNOSIS — L97822 Non-pressure chronic ulcer of other part of left lower leg with fat layer exposed: Secondary | ICD-10-CM | POA: Diagnosis not present

## 2023-12-01 ENCOUNTER — Encounter

## 2023-12-02 ENCOUNTER — Ambulatory Visit (HOSPITAL_COMMUNITY)
Admission: RE | Admit: 2023-12-02 | Discharge: 2023-12-02 | Disposition: A | Discharge status: Home / Self Care | Source: Ambulatory Visit | Attending: Vascular Surgery | Admitting: Vascular Surgery

## 2023-12-02 ENCOUNTER — Ambulatory Visit: Admitting: Vascular Surgery

## 2023-12-02 ENCOUNTER — Encounter: Payer: Self-pay | Admitting: Vascular Surgery

## 2023-12-02 VITALS — BP 114/60 | HR 74 | Temp 98.0°F | Resp 22 | Ht <= 58 in | Wt 157.4 lb

## 2023-12-02 DIAGNOSIS — I872 Venous insufficiency (chronic) (peripheral): Secondary | ICD-10-CM | POA: Diagnosis not present

## 2023-12-02 DIAGNOSIS — R600 Localized edema: Secondary | ICD-10-CM | POA: Insufficient documentation

## 2023-12-02 NOTE — Progress Notes (Signed)
 Patient name: [name] MRN: [id number] DOB: 1934-01-02 Sex: female  REASON FOR CONSULT: Evaluate arterial and venous flow in left leg due to poorly healing wound and edema  HPI: [name] is a 89 y.o. female, with history of hypertension and temporal arteritis that presents for evaluation of arterial and venous circulation in her left leg due to poorly healing wound with edema.  Wound has been present since at least June to the left leg.  This is around the time that she was on high-dose steroids.She is well-known to vascular surgery and did have left temporal artery biopsy on 05/09/2023 that was positive for giant cell arteritis.   ABI 10/15/2023 was 1.04 on the right triphasic and 1.01 on the left triphasic  She is going to the wound clinic and her wounds have gotten significantly better and her leg swelling is much better.    Past Medical History:  Diagnosis Date   Acute blood loss anemia    Arthritis    Benign essential HTN    pt denies    Constipation    GERD (gastroesophageal reflux disease)    infrequently - otc med as needed   Heart murmur    leacky heart valveagrivated by caffiene / activity - no heart studies have been done per pt   Hyponatremia    Hypothyroidism    Leukocytosis    MVP (mitral valve prolapse)    Primary osteoarthritis of right knee    Rosacea    Shingles 10/2017   Unsteady gait     Past Surgical History:  Procedure Laterality Date   ARTERY BIOPSY Left 05/09/2023   Procedure: BIOPSY TEMPORAL ARTERY;  Surgeon: Gretta Lonni PARAS, MD;  Location: [facility] OR;  Service: Vascular;  Laterality: Left;   BUNIONECTOMY     CHOLECYSTECTOMY     COLONOSCOPY     2004 & 06/17/12   FINGER SURGERY     rt thumb   TOTAL KNEE ARTHROPLASTY Right 02/02/2015   Procedure: TOTAL RIGHT KNEE ARTHROPLASTY;  Surgeon: Reyes Billing, MD;  Location: [facility] ORS;  Service: Orthopedics;  Laterality: Right;   TOTAL KNEE ARTHROPLASTY Left 09/21/2015   Procedure: LEFT  TOTAL KNEE ARTHROPLASTY;  Surgeon: Reyes Billing, MD;  Location: [facility] ORS;  Service: Orthopedics;  Laterality: Left;   TUBAL LIGATION      Family History  Problem Relation Age of Onset   Stroke Mother        ?   Congestive Heart Failure Father    Rheum arthritis Sister    Lung cancer Brother    Prostate cancer Brother    Aneurysm Brother        ?   Lung cancer Brother    Other Brother        open heart surgery   Breast cancer Sister    Dementia Sister    Fibromyalgia Sister     SOCIAL HISTORY: Social History   Socioeconomic History   Marital status: Widowed    Spouse name: Not on file   Number of children: 2   Years of education: Not on file   Highest education level: High school graduate  Occupational History   Not on file  Tobacco Use   Smoking status: Never   Smokeless tobacco: Never  Vaping Use   Vaping status: Never Used  Substance and Sexual Activity   Alcohol  use: No   Drug use: No   Sexual activity: Not on file  Other Topics Concern   Not  on file  Social History Narrative   Lives at home alone   Right handed   Social Drivers of Health   Financial Resource Strain: Not on file  Food Insecurity: No Food Insecurity (05/12/2023)   Hunger Vital Sign    Worried About Running Out of Food in the Last Year: Never true    Ran Out of Food in the Last Year: Never true  Transportation Needs: No Transportation Needs (05/12/2023)   PRAPARE - Administrator, Civil Service (Medical): No    Lack of Transportation (Non-Medical): No  Physical Activity: Not on file  Stress: No Stress Concern Present (04/16/2022)   Received from [facility] - Occupational Stress Questionnaire    Feeling of Stress : Only a little  Social Connections: Unknown (05/12/2023)   Social Connection and Isolation Panel    Frequency of Communication with Friends and Family: Three times a week    Frequency of Social Gatherings with Friends and  Family: Three times a week    Attends Religious Services: Patient unable to answer    Active Member of Clubs or Organizations: Patient unable to answer    Attends Club or Organization Meetings: Patient unable to answer    Marital Status: Patient unable to answer  Intimate Partner Violence: Unknown (05/12/2023)   Humiliation, Afraid, Rape, and Kick questionnaire    Fear of Current or Ex-Partner: No    Emotionally Abused: No    Physically Abused: Not on file    Sexually Abused: No    Allergies  Allergen Reactions   Ampicillin Cough    Has patient had a PCN reaction causing immediate rash, facial/tongue/throat swelling, SOB or lightheadedness with hypotension: No Has patient had a PCN reaction causing severe rash involving mucus membranes or skin necrosis: No Has patient had a PCN reaction that required hospitalization No Has patient had a PCN reaction occurring within the last 10 years: No If all of the above answers are NO, then may proceed with Cephalosporin use.   Elastic Bandages & [Zinc]    Sulfa  Antibiotics Cough   Latex Rash    Current Outpatient Medications  Medication Sig Dispense Refill   acetaminophen  (TYLENOL ) 500 MG tablet Take 500 mg by mouth every 6 (six) hours as needed for fever, moderate pain (pain score 4-6) or mild pain (pain score 1-3).     ACTEMRA ACTPEN 162 MG/0.9ML SOAJ Inject 162 mg into the skin every 14 (fourteen) days.     apixaban  (ELIQUIS ) 2.5 MG TABS tablet Take 2.5 mg by mouth 2 (two) times daily.     Biotin 1000 MCG tablet Take 1,000 mcg by mouth at bedtime.     CALCIUM MAGNESIUM  ZINC PO Take 1 tablet by mouth at bedtime.     carvedilol  (COREG ) 6.25 MG tablet Take 1 tablet (6.25 mg total) by mouth 2 (two) times daily. 180 tablet 3   Cholecalciferol  (VITAMIN D -3 PO) Take 1 tablet by mouth at bedtime.     docusate sodium  (COLACE) 100 MG capsule Take 1 capsule (100 mg total) by mouth 2 (two) times daily as needed for mild constipation.     empagliflozin   (JARDIANCE ) 10 MG TABS tablet Take 1 tablet (10 mg total) by mouth daily. 30 tablet 0   furosemide  (LASIX ) 20 MG tablet TAKE 1 TABLET BY MOUTH EVERY DAY 90 tablet 2   Homeopathic Products (CALENDULA EX) Apply 1 Application topically as needed.     IRON, FERROUS SULFATE,  PO Take 65 capsules by mouth.     levothyroxine  (SYNTHROID ) 75 MCG tablet Take 75 mcg by mouth daily before breakfast. TAKE 1 TABLET IN THE MORNING ON AN EMPTY STOMACH MON-SAT AND 1/2 TABLET ON SUNDAY     losartan  (COZAAR ) 25 MG tablet Take 1 tablet (25 mg total) by mouth daily. 30 tablet 0   MAGNESIUM  PO Take 1 tablet by mouth at bedtime.     melatonin 5 MG TABS Take 1 tablet (5 mg total) by mouth at bedtime.     mirtazapine  (REMERON  SOL-TAB) 15 MG disintegrating tablet Dissolve 0.5 tablets (7.5 mg total) under the tongue at bedtime. (Patient not taking: Reported on 09/22/2023) 30 tablet 0   nystatin  (MYCOSTATIN ) 100000 UNIT/ML suspension Take 5 mLs (500,000 Units total) by mouth 4 (four) times daily. (Patient not taking: Reported on 09/22/2023) 120 mL 0   pantoprazole  (PROTONIX ) 40 MG tablet Take 1 tablet (40 mg total) by mouth daily. 30 tablet 0   Polyethyl Glycol-Propyl Glycol (SYSTANE OP) Apply 1 drop to eye as needed. Both eyes     predniSONE  (DELTASONE ) 10 MG tablet Take 10 mg by mouth daily with breakfast.     Probiotic Product (PROBIOTIC BLEND PO) Take by mouth.     sulfamethoxazole -trimethoprim  (BACTRIM  DS) 800-160 MG tablet Take 1 tablet by mouth 3 (three) times a week. 30 tablet 0   No current facility-administered medications for this visit.    REVIEW OF SYSTEMS:  [X]  denotes positive finding, [ ]  denotes negative finding Cardiac  Comments:  Chest pain or chest pressure:    Shortness of breath upon exertion:    Short of breath when lying flat:    Irregular heart rhythm:        Vascular    Pain in calf, thigh, or hip brought on by ambulation:    Pain in feet at night that wakes you up from your sleep:     Blood  clot in your veins:    Leg swelling:  x       Pulmonary    Oxygen at home:    Productive cough:     Wheezing:         Neurologic    Sudden weakness in arms or legs:     Sudden numbness in arms or legs:     Sudden onset of difficulty speaking or slurred speech:    Temporary loss of vision in one eye:     Problems with dizziness:         Gastrointestinal    Blood in stool:     Vomited blood:         Genitourinary    Burning when urinating:     Blood in urine:        Psychiatric    Major depression:         Hematologic    Bleeding problems:    Problems with blood clotting too easily:        Skin    Rashes or ulcers:        Constitutional    Fever or chills:      PHYSICAL EXAM: There were no vitals filed for this visit.  GENERAL: The patient is a well-nourished female, in no acute distress. The vital signs are documented above. CARDIAC: There is a regular rate and rhythm.  VASCULAR:  Left femoral pulse palpable Left DP palpable Left calf wounds as pictured  PULMONARY: No respiratory distress ABDOMEN: Soft and non-tender. MUSCULOSKELETAL: There are  no major deformities or cyanosis. NEUROLOGIC: No focal weakness or paresthesias are detected. PSYCHIATRIC: The patient has a normal affect.    DATA:   ABI 10/15/2023 was 1.04 on the right triphasic and 1.01 on the left triphasic; toe pressure on the right is 106 and toe pressure on the left is 116  Lower Venous Reflux Study  Patient Name:  [name]  Date of Exam:   12/02/2023 Medical Rec #: [id number]           Accession #:    [id number] Date of Birth: [birth year]           Patient Gender: F Patient Age:   58 years Exam Location:  [address] Procedure:      VAS US  LOWER EXTREMITY VENOUS REFLUX Referring Phys: LONNI GASKINS   --------------------------------------------------------------------------- -----   Indications: Pain, swelling and wounds on the left lower extremity. Lymphedema and  chronic vneous hypertension.   Anticoagulation: Eliquis . Limitations: Bandages and open wound. Patient was in pain during exam, unable to obtain good compressions or distal augmentations due to tenderness of leg. Bandages covering open wounds on the left calf. Comparison Study: Bilateral lower extremity venous duplex 05/10/23 at [facility]                   showed acute DVT of the bilateral posterior tibial and                   peroneal veins.  Performing Technologist: Edsel Mustard RVT    Examination Guidelines: A complete evaluation includes B-mode imaging, spectral Doppler, color Doppler, and power Doppler as needed of all accessible portions of each vessel. Bilateral testing is considered an integral part of a complete examination. Limited examinations for reoccurring indications may be performed as noted. The reflux portion of the exam is performed with the patient in reverse Trendelenburg. Significant venous reflux is defined as >500 ms in the superficial venous system, and >1 second in the deep venous system.    +--------------+---------+------+----------+------------+------------------ ----+ LEFT          Reflux NoReflux  Reflux  Diameter cmsComments                                        Yes     Time                                       +--------------+---------+------+----------+------------+------------------ ----+ CFV           no                                                           +--------------+---------+------+----------+------------+------------------ ----+ FV mid        no                                                           +--------------+---------+------+----------+------------+------------------ ----+ Popliteal     no                                                           +--------------+---------+------+----------+------------+------------------ ----+  GSV at [facility]    no                            0.64                            +--------------+---------+------+----------+------------+------------------ ----+ GSV prox thighno                           0.43                            +--------------+---------+------+----------+------------+------------------ ----+ GSV mid thigh no                           0.53                            +--------------+---------+------+----------+------------+------------------ ----+ GSV dist thighno                           0.37                            +--------------+---------+------+----------+------------+------------------ ----+ GSV at knee   no                           0.33                            +--------------+---------+------+----------+------------+------------------ ----+ GSV prox calf no                           0.29                            +--------------+---------+------+----------+------------+------------------ ----+ GSV mid calf                                       TDS wound/bandage/pain +--------------+---------+------+----------+------------+------------------ ----+ GSV dist calf                                      TDS wound/bandage/pain +--------------+---------+------+----------+------------+------------------ ----+ SSV at [facility]    no                           0.27                            +--------------+---------+------+----------+------------+------------------ ----+  Difficult to evaluate for reflux due to patient pain and guarding when leg was touched.     Summary: Left: - No evidence of deep vein thrombosis seen in the left lower extremity, from the common femoral through the popliteal veins. - No evidence of superficial venous thrombosis in the left lower extremity. - There is no evidence of venous reflux seen in the left lower extremity. - The greater saphenous vein was not visualized in the mid and distal calf due  to study  being technically difficult.   *See table(s) above for measurements and observations.  Electronically signed by Lonni Gaskins MD on 12/02/2023 at 2:59:08 PM.       Final      Assessment/Plan:  89 y.o. female, with history of hypertension and temporal arteritis that presents for evaluation of arterial and venous circulation in her left leg due to poorly healing wound with edema.  Wound has been present since at least June to the left leg.  This is around the time that she was on high-dose steroids.She is well-known to vascular surgery and did have left temporal artery biopsy on 05/09/2023 that was positive for giant cell arteritis.   Discussed that her ABIs are normal with a palpable DP pulse in the foot.  No signs of significant arterial insufficiency.  Toe pressure more than adequate for wound healing as discussed with family.  Left leg reflux study also shows no DVT and no evidence of venous reflux.  I suspect that a lot of her edema with wound breakdown was related to high-dose steroids at the time of temporal arteritis diagnosis causing profound leg swelling.  This is all improving.  Would continue local wound care.  She can follow-up with me PRN.   Lonni DOROTHA Gaskins, MD [facility] Office: [phone]

## 2023-12-09 ENCOUNTER — Encounter: Attending: Physician Assistant | Admitting: Physician Assistant

## 2023-12-09 DIAGNOSIS — L97812 Non-pressure chronic ulcer of other part of right lower leg with fat layer exposed: Secondary | ICD-10-CM | POA: Insufficient documentation

## 2023-12-09 DIAGNOSIS — I2510 Atherosclerotic heart disease of native coronary artery without angina pectoris: Secondary | ICD-10-CM | POA: Diagnosis not present

## 2023-12-09 DIAGNOSIS — I890 Lymphedema, not elsewhere classified: Secondary | ICD-10-CM | POA: Insufficient documentation

## 2023-12-09 DIAGNOSIS — L97822 Non-pressure chronic ulcer of other part of left lower leg with fat layer exposed: Secondary | ICD-10-CM | POA: Diagnosis not present

## 2023-12-09 DIAGNOSIS — I5042 Chronic combined systolic (congestive) and diastolic (congestive) heart failure: Secondary | ICD-10-CM | POA: Insufficient documentation

## 2023-12-09 DIAGNOSIS — I87333 Chronic venous hypertension (idiopathic) with ulcer and inflammation of bilateral lower extremity: Secondary | ICD-10-CM | POA: Insufficient documentation

## 2023-12-09 DIAGNOSIS — I110 Hypertensive heart disease with heart failure: Secondary | ICD-10-CM | POA: Diagnosis not present

## 2023-12-19 ENCOUNTER — Encounter: Admitting: Internal Medicine

## 2023-12-19 DIAGNOSIS — I890 Lymphedema, not elsewhere classified: Secondary | ICD-10-CM | POA: Diagnosis not present

## 2023-12-22 NOTE — Progress Notes (Unsigned)
 Cardiology Clinic Note   Patient Name: [name] Date of Encounter: 12/23/2023  Primary Care Provider:  Okey Carlin Redbird, MD Primary Cardiologist:  None  Patient Profile    [name] 89 year old female presents to the clinic today for follow-up evaluation of her acute systolic CHF and cardiomyopathy.  Past Medical History    Past Medical History:  Diagnosis Date   Acute blood loss anemia    Arthritis    Benign essential HTN    pt denies    Constipation    GERD (gastroesophageal reflux disease)    infrequently - otc med as needed   Heart murmur    leacky heart valveagrivated by caffiene / activity - no heart studies have been done per pt   Hyponatremia    Hypothyroidism    Leukocytosis    MVP (mitral valve prolapse)    Peripheral vascular disease    Primary osteoarthritis of right knee    Rosacea    Shingles 10/2017   Unsteady gait    Past Surgical History:  Procedure Laterality Date   ARTERY BIOPSY Left 05/09/2023   Procedure: BIOPSY TEMPORAL ARTERY;  Surgeon: Gretta Lonni PARAS, MD;  Location: [facility] OR;  Service: Vascular;  Laterality: Left;   BUNIONECTOMY     CHOLECYSTECTOMY     COLONOSCOPY     2004 & 06/17/12   FINGER SURGERY     rt thumb   TOTAL KNEE ARTHROPLASTY Right 02/02/2015   Procedure: TOTAL RIGHT KNEE ARTHROPLASTY;  Surgeon: Reyes Billing, MD;  Location: [facility] ORS;  Service: Orthopedics;  Laterality: Right;   TOTAL KNEE ARTHROPLASTY Left 09/21/2015   Procedure: LEFT TOTAL KNEE ARTHROPLASTY;  Surgeon: Reyes Billing, MD;  Location: [facility] ORS;  Service: Orthopedics;  Laterality: Left;   TUBAL LIGATION      Allergies  Allergies  Allergen Reactions   Ampicillin Cough    Has patient had a PCN reaction causing immediate rash, facial/tongue/throat swelling, SOB or lightheadedness with hypotension: No Has patient had a PCN reaction causing severe rash involving mucus membranes or skin necrosis: No Has patient had a PCN reaction that  required hospitalization No Has patient had a PCN reaction occurring within the last 10 years: No If all of the above answers are NO, then may proceed with Cephalosporin use.   Elastic Bandages & [Zinc]    Sulfa  Antibiotics Cough   Latex Rash    History of Present Illness    [name] has a PMH of HTN, GERD, cardiac murmur, cardiomyopathy, LBBB, and elevated coronary artery calcium scoring.  She was seen and evaluated by Dr. Lavona on 06/16/2023.  During that time she reported that she had been in the hospital.  She was treated with steroids for her TA.  Echocardiogram showed an EF of 40% and she was noted to have LBBB.  She had not been previously evaluated by cardiology.  She reported that she would get around slowly in her house.  She was having problems with balance and her joints.  She was using a walker since being hospitalized.  She was living alone.  Her daughter was staying with her.  She was doing light house chores.  She did note some increased lower extremity swelling.  She denied chest pain, discomfort, arm and neck pain.  She denied shortness of breath, PND and orthopnea.  She denied palpitations and presyncope.  She was prescribed furosemide  for couple days to see if this would help with lower extremity edema.  It was felt that her lower extremity swelling may be related to her steroids.  Her carvedilol  was increased to 6.25 mg twice daily.  Statin therapy was avoided due to elevated liver enzymes.  She was also noted to have elevated coronary calcium scoring.  This was felt to be incidental finding.  She denied chest discomfort and further cardiac workup/ischemic evaluation was deferred.  She developed lower extremity wounds and was seen and is being treated for this by wound care.  She presented to the clinic 09/22/23 for evaluation and stated she continued to be somewhat physically active.  She presented with her daughters.  Her daughters reported that she was more  physically active when she was doing physical therapy more regularly.  She was seeing wound care for management of her lower extremity swelling and weeping ulcers.  She continued to taper her steroids for TA.  We reviewed the importance of not over hydrating and reducing sodium in her diet.  Her  medication regimen was continued.  Her lab work was stable from her PCP.  Follow-up in 3 to 4 months was planned.  She presents to the clinic today for follow-up evaluation and states she continues to be fairly physically active walking daily.  She presents with her daughter.  Her left lower extremity continues to be wrapped.  She is followed by wound care for this.  Her right lower extremity is euvolemic and without weeping ulcers.  We reviewed her medication.  We reviewed her prior clinic visit.  Her blood pressure is well-controlled today.  She denies cardiac complaints.  Today she denies chest pain, shortness of breath,  palpitations, melena, hematuria, hemoptysis, diaphoresis, weakness, presyncope, syncope, orthopnea, and PND.   Home Medications    Prior to Admission medications   Medication Sig Start Date End Date Taking? Authorizing Provider  acetaminophen  (TYLENOL ) 500 MG tablet Take 500 mg by mouth every 6 (six) hours as needed for fever, moderate pain (pain score 4-6) or mild pain (pain score 1-3).    [provider]  APIXABAN  (ELIQUIS ) VTE STARTER PACK (10MG  AND 5MG ) Take as directed on package: start with two-5mg  tablets twice daily for 7 days. On day 8, switch to one-5mg  tablet twice daily. 05/12/23   Elgergawy, Dawood S, MD  Biotin 1000 MCG tablet Take 1,000 mcg by mouth at bedtime.    [provider]  CALCIUM MAGNESIUM  ZINC PO Take 1 tablet by mouth at bedtime.    [provider]  carvedilol  (COREG ) 6.25 MG tablet Take 1 tablet (6.25 mg total) by mouth 2 (two) times daily. 06/16/23   Lavona Agent, MD  Cholecalciferol  (VITAMIN D -3 PO) Take 1 tablet by mouth at bedtime.     [provider]  docusate sodium  (COLACE) 100 MG capsule Take 1 capsule (100 mg total) by mouth 2 (two) times daily as needed for mild constipation. 05/12/23   Elgergawy, Brayton RAMAN, MD  empagliflozin  (JARDIANCE ) 10 MG TABS tablet Take 1 tablet (10 mg total) by mouth daily. 05/12/23   Elgergawy, Brayton RAMAN, MD  furosemide  (LASIX ) 20 MG tablet TAKE 1 TABLET BY MOUTH EVERY DAY 09/11/23   Hochrein, James, MD  Homeopathic Products (CALENDULA EX) Apply 1 Application topically as needed.    [provider]  IRON, FERROUS SULFATE, PO Take 65 capsules by mouth.    [provider]  levothyroxine  (SYNTHROID ) 75 MCG tablet Take 75 mcg by mouth daily before breakfast. TAKE 1 TABLET IN THE MORNING ON AN EMPTY STOMACH MON-SAT AND  1/2 TABLET ON SUNDAY    [provider]  losartan  (COZAAR ) 25 MG tablet Take 1 tablet (25 mg total) by mouth daily. 05/13/23   Elgergawy, Brayton RAMAN, MD  MAGNESIUM  PO Take 1 tablet by mouth at bedtime.    [provider]  melatonin 5 MG TABS Take 1 tablet (5 mg total) by mouth at bedtime. 05/12/23   Elgergawy, Brayton RAMAN, MD  mirtazapine  (REMERON  SOL-TAB) 15 MG disintegrating tablet Dissolve 0.5 tablets (7.5 mg total) under the tongue at bedtime. Patient not taking: Reported on 06/16/2023 05/12/23   Elgergawy, Brayton RAMAN, MD  nystatin  (MYCOSTATIN ) 100000 UNIT/ML suspension Take 5 mLs (500,000 Units total) by mouth 4 (four) times daily. 05/12/23   Elgergawy, Brayton RAMAN, MD  pantoprazole  (PROTONIX ) 40 MG tablet Take 1 tablet (40 mg total) by mouth daily. 05/13/23   Elgergawy, Brayton RAMAN, MD  Polyethyl Glycol-Propyl Glycol (SYSTANE OP) Apply 1 drop to eye as needed. Both eyes    [provider]  predniSONE  (DELTASONE ) 20 MG tablet Take 3 tablets (60 mg total) by mouth daily with breakfast. 05/13/23   Elgergawy, Brayton RAMAN, MD  Probiotic Product (PROBIOTIC BLEND PO) Take by mouth.    [provider]  sulfamethoxazole -trimethoprim  (BACTRIM  DS) 800-160 MG tablet  Take 1 tablet by mouth 3 (three) times a week. 05/12/23   Elgergawy, Brayton RAMAN, MD    Family History    Family History  Problem Relation Age of Onset   Stroke Mother        ?   Congestive Heart Failure Father    Rheum arthritis Sister    Lung cancer Brother    Prostate cancer Brother    Aneurysm Brother        ?   Lung cancer Brother    Other Brother        open heart surgery   Breast cancer Sister    Dementia Sister    Fibromyalgia Sister    She indicated that her mother is deceased. She indicated that her father is deceased. She indicated that all of her three sisters are alive. She indicated that both of her brothers are deceased.  Social History    Social History   Socioeconomic History   Marital status: Widowed    Spouse name: Not on file   Number of children: 2   Years of education: Not on file   Highest education level: High school graduate  Occupational History   Not on file  Tobacco Use   Smoking status: Never   Smokeless tobacco: Never  Vaping Use   Vaping status: Never Used  Substance and Sexual Activity   Alcohol  use: No   Drug use: No   Sexual activity: Not on file  Other Topics Concern   Not on file  Social History Narrative   Lives at home alone   Right handed   Social Drivers of Health   Tobacco Use: Low Risk (12/23/2023)   Patient History    Smoking Tobacco Use: Never    Smokeless Tobacco Use: Never    Passive Exposure: Not on file  Financial Resource Strain: Not on file  Food Insecurity: No Food Insecurity (05/12/2023)   Hunger Vital Sign    Worried About Running Out of Food in the Last Year: Never true    Ran Out of Food in the Last Year: Never true  Transportation Needs: No Transportation Needs (05/12/2023)   PRAPARE - Administrator, Civil Service (Medical): No  Lack of Transportation (Non-Medical): No  Physical Activity: Not on file  Stress: No Stress Concern Present (04/16/2022)   Received from [facility] - Occupational Stress Questionnaire    Feeling of Stress : Only a little  Social Connections: Unknown (05/12/2023)   Social Connection and Isolation Panel    Frequency of Communication with Friends and Family: Three times a week    Frequency of Social Gatherings with Friends and Family: Three times a week    Attends Religious Services: Patient unable to answer    Active Member of Clubs or Organizations: Patient unable to answer    Attends Club or Organization Meetings: Patient unable to answer    Marital Status: Patient unable to answer  Intimate Partner Violence: Unknown (05/12/2023)   Humiliation, Afraid, Rape, and Kick questionnaire    Fear of Current or Ex-Partner: No    Emotionally Abused: No    Physically Abused: Not on file    Sexually Abused: No  Depression (PHQ2-9): Not on file  Alcohol  Screen: Not on file  Housing: Low Risk (05/12/2023)   Housing Stability Vital Sign    Unable to Pay for Housing in the Last Year: No    Number of Times Moved in the Last Year: 0    Homeless in the Last Year: No  Utilities: Not At Risk (05/12/2023)   AHC Utilities    Threatened with loss of utilities: No  Health Literacy: Not on file     Review of Systems    General:  No chills, fever, night sweats or weight changes.  Cardiovascular:  No chest pain, dyspnea on exertion, edema, orthopnea, palpitations, paroxysmal nocturnal dyspnea. Dermatological: No rash, lesions/masses Respiratory: No cough, dyspnea Urologic: No hematuria, dysuria Abdominal:   No nausea, vomiting, diarrhea, bright red blood per rectum, melena, or hematemesis Neurologic:  No visual changes, wkns, changes in mental status. All other systems reviewed and are otherwise negative except as noted above.  Physical Exam    VS:  BP 122/60   Pulse 74   Ht 4' 10.5 (1.486 m)   Wt 153 lb (69.4 kg)   SpO2 94%   BMI 31.43 kg/m  , BMI Body mass index is 31.43 kg/m. GEN: Well nourished, well  developed, in no acute distress. HEENT: normal. Neck: Supple, no JVD, carotid bruits, or masses. Cardiac: RRR, 2/6 systolic murmur heard along apex , rubs, or gallops. No clubbing, cyanosis, left lower extremity 1+ pitting edema.  Radials/DP/PT 2+ and equal bilaterally.  Respiratory:  Respirations regular and unlabored, clear to auscultation bilaterally. GI: Soft, nontender, nondistended, BS + x 4. MS: no deformity or atrophy. Skin: warm and dry, no rash. Neuro:  Strength and sensation are intact. Psych: Normal affect.  Accessory Clinical Findings    Recent Labs: 05/08/2023: TSH 6.647 05/12/2023: Magnesium  2.2 08/02/2023: ALT 53; BUN 34; Creatinine, Ser 1.28; Hemoglobin 13.7; Platelets 132; Potassium 4.4; Pro Brain Natriuretic Peptide 629.0; Sodium 139   Recent Lipid Panel    Component Value Date/Time   CHOL 174 05/08/2023 1700   TRIG 104 05/08/2023 1700   HDL 32 (L) 05/08/2023 1700   CHOLHDL 5.4 05/08/2023 1700   VLDL 21 05/08/2023 1700   LDLCALC 121 (H) 05/08/2023 1700         ECG personally reviewed by me today-none today.    Echocardiogram 05/08/2023   IMPRESSIONS     1. Normal left ventricular size. Left ventricular ejection fraction, by  estimation, is 40%. The  left ventricle has mild to moderately decreased  function. The left ventricle demonstrates global hypokinesis worse in the  septum, septal-lateral dyssynchrony   consistent with LBBB. Left ventricular diastolic parameters are  consistent with Grade I diastolic dysfunction (impaired relaxation).   2. Right ventricular systolic function is normal. The right ventricular  size is normal. There is normal pulmonary artery systolic pressure. The  estimated right ventricular systolic pressure is 16.0 mmHg.   3. Bubble study negative, no PFO or ASD.   4. The mitral valve is degenerative. Mild mitral valve regurgitation. No  evidence of mitral stenosis. Moderate mitral annular calcification.   5. The aortic valve is  tricuspid. Aortic valve regurgitation is mild. No  aortic stenosis is present.   6. The inferior vena cava is normal in size with greater than 50%  respiratory variability, suggesting right atrial pressure of 3 mmHg.   FINDINGS   Left Ventricle: Left ventricular ejection fraction, by estimation, is  40%. The left ventricle has mild to moderately decreased function. The  left ventricle demonstrates global hypokinesis. The left ventricular  internal cavity size was normal in size.  There is no left ventricular hypertrophy. Left ventricular diastolic  parameters are consistent with Grade I diastolic dysfunction (impaired  relaxation).   Right Ventricle: The right ventricular size is normal. No increase in  right ventricular wall thickness. Right ventricular systolic function is  normal. There is normal pulmonary artery systolic pressure. The tricuspid  regurgitant velocity is 1.80 m/s, and   with an assumed right atrial pressure of 3 mmHg, the estimated right  ventricular systolic pressure is 16.0 mmHg.   Left Atrium: Left atrial size was normal in size.   Right Atrium: Right atrial size was normal in size.   Pericardium: There is no evidence of pericardial effusion.   Mitral Valve: The mitral valve is degenerative in appearance. There is  mild calcification of the mitral valve leaflet(s). Moderate mitral annular  calcification. Mild mitral valve regurgitation. No evidence of mitral  valve stenosis.   Tricuspid Valve: The tricuspid valve is normal in structure. Tricuspid  valve regurgitation is trivial.   Aortic Valve: The aortic valve is tricuspid. Aortic valve regurgitation is  mild. Aortic regurgitation PHT measures 597 msec. No aortic stenosis is  present. Aortic valve peak gradient measures 5.7 mmHg.   Pulmonic Valve: The pulmonic valve was normal in structure. Pulmonic valve  regurgitation is not visualized.   Aorta: The aortic root is normal in size and structure.    Venous: The inferior vena cava is normal in size with greater than 50%  respiratory variability, suggesting right atrial pressure of 3 mmHg.   IAS/Shunts: Bubble study negative, no PFO or ASD. Agitated saline contrast  was given intravenously to evaluate for intracardiac shunting.      Assessment & Plan   1.  Combined systolic and diastolic CHF-breathing stable.  Generalized left lower extremity nonpitting edema. This was previously felt to be exacerbated by steroid use for temporal arteritis.  Echocardiogram 05/08/2023 showed an LVEF of 40% and G1 DD.  LV global hypokinesis worsened septum and consistent with LBBB. Heart healthy low-sodium diet-reviewed Elevate lower extremities when not active Lower extremity support stockings Continue carvedilol , furosemide  Daily weights, weight log-contact office with a weight increase of 3 pounds overnight or 5 pounds in 1 week.  Coronary artery disease-she denies exertional chest discomfort.  She was previously noted to have elevated coronary calcium score which is an incidental finding.  Statin therapy not initiated  due to elevated liver enzymes. High-fiber diet Maintain physical activity/increase as tolerated Continue carvedilol   Cardiac murmur, MR-noted to have 2/6 systolic murmur heard along apex. Continue to monitor Recommend repeat echocardiogram 5/26  Bilateral DVT, PE-reports compliance with apixaban .   She continues to deny bleeding issues and trauma. Continue apixaban  Follows with PCP  Disposition: Follow-up with Dr. Lavona or me in 6 months.   [name]. Jasmine Poorman NP-C     12/23/2023, 4:18 PM [facility] [address] [place], [place] [address] Office [phone]      I spent 14 minutes examining this patient, reviewing medications, and using patient centered shared decision making involving their cardiac care.   I spent  20 minutes reviewing past medical history,  medications,  and prior cardiac tests.

## 2023-12-23 ENCOUNTER — Ambulatory Visit: Attending: General Practice | Admitting: General Practice

## 2023-12-23 ENCOUNTER — Encounter: Payer: Self-pay | Admitting: General Practice

## 2023-12-23 VITALS — BP 122/60 | HR 74 | Ht 58.5 in | Wt 153.0 lb

## 2023-12-23 DIAGNOSIS — I5042 Chronic combined systolic (congestive) and diastolic (congestive) heart failure: Secondary | ICD-10-CM

## 2023-12-23 DIAGNOSIS — I340 Nonrheumatic mitral (valve) insufficiency: Secondary | ICD-10-CM

## 2023-12-23 DIAGNOSIS — Z86718 Personal history of other venous thrombosis and embolism: Secondary | ICD-10-CM

## 2023-12-23 DIAGNOSIS — R931 Abnormal findings on diagnostic imaging of heart and coronary circulation: Secondary | ICD-10-CM

## 2023-12-23 DIAGNOSIS — Z86711 Personal history of pulmonary embolism: Secondary | ICD-10-CM

## 2023-12-23 DIAGNOSIS — R011 Cardiac murmur, unspecified: Secondary | ICD-10-CM

## 2023-12-23 NOTE — Patient Instructions (Signed)
 Medication Instructions:  Your physician recommends that you continue on your current medications as directed. Please refer to the Current Medication list given to you today.  *If you need a refill on your cardiac medications before your next appointment, please call your pharmacy*   Follow-Up: At [facility], you and your health needs are our priority.  As part of our continuing mission to provide you with exceptional heart care, our providers are all part of one team.  This team includes your primary Cardiologist (physician) and Advanced Practice Providers or APPs (Physician Assistants and Nurse Practitioners) who all work together to provide you with the care you need, when you need it.  Your next appointment:   6 month(s)  Provider:   Josefa Beauvais, NP          We recommend signing up for the patient portal called MyChart.  Sign up information is provided on this After Visit Summary.  MyChart is used to connect with patients for Virtual Visits (Telemedicine).  Patients are able to view lab/test results, encounter notes, upcoming appointments, etc.  Non-urgent messages can be sent to your provider as well.   To learn more about what you can do with MyChart, go to [email or web address].   Other Instructions Daily weight log. Please notify us  if you have a weight gain more than 3lbs overnight or 5lbs over a week

## 2023-12-25 ENCOUNTER — Encounter: Admitting: Internal Medicine

## 2023-12-25 DIAGNOSIS — I890 Lymphedema, not elsewhere classified: Secondary | ICD-10-CM | POA: Diagnosis not present

## 2023-12-30 ENCOUNTER — Encounter: Admitting: Physician Assistant

## 2023-12-30 DIAGNOSIS — I890 Lymphedema, not elsewhere classified: Secondary | ICD-10-CM | POA: Diagnosis not present

## 2024-01-13 ENCOUNTER — Encounter: Attending: Physician Assistant | Admitting: Physician Assistant

## 2024-01-13 DIAGNOSIS — L97822 Non-pressure chronic ulcer of other part of left lower leg with fat layer exposed: Secondary | ICD-10-CM | POA: Diagnosis not present

## 2024-01-13 DIAGNOSIS — I110 Hypertensive heart disease with heart failure: Secondary | ICD-10-CM | POA: Insufficient documentation

## 2024-01-13 DIAGNOSIS — I2510 Atherosclerotic heart disease of native coronary artery without angina pectoris: Secondary | ICD-10-CM | POA: Diagnosis not present

## 2024-01-13 DIAGNOSIS — L97812 Non-pressure chronic ulcer of other part of right lower leg with fat layer exposed: Secondary | ICD-10-CM | POA: Diagnosis not present

## 2024-01-13 DIAGNOSIS — I87333 Chronic venous hypertension (idiopathic) with ulcer and inflammation of bilateral lower extremity: Secondary | ICD-10-CM | POA: Insufficient documentation

## 2024-01-13 DIAGNOSIS — I5042 Chronic combined systolic (congestive) and diastolic (congestive) heart failure: Secondary | ICD-10-CM | POA: Insufficient documentation

## 2024-01-20 ENCOUNTER — Encounter: Admitting: Physician Assistant

## 2024-01-20 DIAGNOSIS — I87333 Chronic venous hypertension (idiopathic) with ulcer and inflammation of bilateral lower extremity: Secondary | ICD-10-CM | POA: Diagnosis not present

## 2024-01-28 ENCOUNTER — Encounter: Admitting: Physician Assistant

## 2024-01-28 DIAGNOSIS — I87333 Chronic venous hypertension (idiopathic) with ulcer and inflammation of bilateral lower extremity: Secondary | ICD-10-CM | POA: Diagnosis not present

## 2024-02-06 ENCOUNTER — Encounter: Admitting: Physician Assistant

## 2024-02-06 DIAGNOSIS — I87333 Chronic venous hypertension (idiopathic) with ulcer and inflammation of bilateral lower extremity: Secondary | ICD-10-CM | POA: Diagnosis not present

## 2024-02-12 ENCOUNTER — Encounter: Admitting: Physician Assistant

## 2024-02-19 ENCOUNTER — Encounter: Admitting: Physician Assistant

## 2024-02-26 ENCOUNTER — Encounter: Admitting: Physician Assistant
